# Patient Record
Sex: Male | Born: 1975 | Race: White | Hispanic: No | Marital: Married | Smoking: Current every day smoker
Health system: Southern US, Community
[De-identification: ages and names within clinical notes are randomized; demographics above are authoritative.]

## PROBLEM LIST (undated history)

## (undated) DIAGNOSIS — K449 Diaphragmatic hernia without obstruction or gangrene: Secondary | ICD-10-CM

## (undated) DIAGNOSIS — N200 Calculus of kidney: Secondary | ICD-10-CM

## (undated) DIAGNOSIS — R109 Unspecified abdominal pain: Secondary | ICD-10-CM

## (undated) DIAGNOSIS — M549 Dorsalgia, unspecified: Secondary | ICD-10-CM

## (undated) DIAGNOSIS — R7401 Elevation of levels of liver transaminase levels: Secondary | ICD-10-CM

## (undated) DIAGNOSIS — R7402 Elevation of levels of lactic acid dehydrogenase (LDH): Secondary | ICD-10-CM

## (undated) DIAGNOSIS — K3184 Gastroparesis: Secondary | ICD-10-CM

## (undated) DIAGNOSIS — K760 Fatty (change of) liver, not elsewhere classified: Secondary | ICD-10-CM

## (undated) DIAGNOSIS — R740 Nonspecific elevation of levels of transaminase and lactic acid dehydrogenase [LDH]: Secondary | ICD-10-CM

## (undated) DIAGNOSIS — K219 Gastro-esophageal reflux disease without esophagitis: Secondary | ICD-10-CM

## (undated) HISTORY — DX: Dorsalgia, unspecified: M54.9

## (undated) HISTORY — PX: OTHER SURGICAL HISTORY: SHX169

## (undated) HISTORY — PX: TONSILLECTOMY: SUR1361

## (undated) HISTORY — DX: Unspecified abdominal pain: R10.9

## (undated) HISTORY — DX: Nonspecific elevation of levels of transaminase and lactic acid dehydrogenase (ldh): R74.0

## (undated) HISTORY — DX: Calculus of kidney: N20.0

## (undated) HISTORY — DX: Elevation of levels of lactic acid dehydrogenase (LDH): R74.02

## (undated) HISTORY — DX: Fatty (change of) liver, not elsewhere classified: K76.0

## (undated) HISTORY — DX: Gastro-esophageal reflux disease without esophagitis: K21.9

## (undated) HISTORY — DX: Gastroparesis: K31.84

## (undated) HISTORY — DX: Diaphragmatic hernia without obstruction or gangrene: K44.9

## (undated) HISTORY — DX: Elevation of levels of liver transaminase levels: R74.01

---

## 1998-06-21 ENCOUNTER — Emergency Department (HOSPITAL_COMMUNITY): Admission: EM | Admit: 1998-06-21 | Discharge: 1998-06-21 | Payer: Self-pay | Admitting: Emergency Medicine

## 2004-03-09 ENCOUNTER — Ambulatory Visit (HOSPITAL_COMMUNITY): Admission: RE | Admit: 2004-03-09 | Discharge: 2004-03-09 | Payer: Self-pay | Admitting: Family Medicine

## 2005-07-21 ENCOUNTER — Emergency Department (HOSPITAL_COMMUNITY): Admission: EM | Admit: 2005-07-21 | Discharge: 2005-07-21 | Payer: Self-pay | Admitting: Emergency Medicine

## 2010-03-11 ENCOUNTER — Emergency Department (HOSPITAL_COMMUNITY): Admission: EM | Admit: 2010-03-11 | Discharge: 2010-03-11 | Payer: Self-pay | Admitting: Emergency Medicine

## 2010-07-20 ENCOUNTER — Ambulatory Visit: Payer: Self-pay | Admitting: Internal Medicine

## 2010-07-20 DIAGNOSIS — R51 Headache: Secondary | ICD-10-CM | POA: Insufficient documentation

## 2010-07-20 DIAGNOSIS — F17200 Nicotine dependence, unspecified, uncomplicated: Secondary | ICD-10-CM | POA: Insufficient documentation

## 2010-07-20 DIAGNOSIS — R519 Headache, unspecified: Secondary | ICD-10-CM | POA: Insufficient documentation

## 2010-07-20 DIAGNOSIS — Z720 Tobacco use: Secondary | ICD-10-CM

## 2010-07-20 DIAGNOSIS — R040 Epistaxis: Secondary | ICD-10-CM | POA: Insufficient documentation

## 2010-07-20 HISTORY — DX: Tobacco use: Z72.0

## 2010-07-20 LAB — CONVERTED CEMR LAB
AST: 25 units/L (ref 0–37)
Albumin: 4.5 g/dL (ref 3.5–5.2)
Alkaline Phosphatase: 79 units/L (ref 39–117)
Basophils Relative: 0.8 % (ref 0.0–3.0)
CO2: 31 meq/L (ref 19–32)
Calcium: 9.9 mg/dL (ref 8.4–10.5)
Chloride: 101 meq/L (ref 96–112)
Cholesterol: 227 mg/dL — ABNORMAL HIGH (ref 0–200)
Eosinophils Absolute: 0.2 10*3/uL (ref 0.0–0.7)
Glucose, Bld: 87 mg/dL (ref 70–99)
HCT: 44.4 % (ref 39.0–52.0)
Hemoglobin: 15.4 g/dL (ref 13.0–17.0)
Lymphocytes Relative: 28.2 % (ref 12.0–46.0)
Lymphs Abs: 2.4 10*3/uL (ref 0.7–4.0)
MCHC: 34.8 g/dL (ref 30.0–36.0)
Neutro Abs: 5.3 10*3/uL (ref 1.4–7.7)
Potassium: 4.7 meq/L (ref 3.5–5.1)
RBC: 5.2 M/uL (ref 4.22–5.81)
RDW: 13 % (ref 11.5–14.6)
Sodium: 138 meq/L (ref 135–145)
Total Protein: 7.3 g/dL (ref 6.0–8.3)

## 2010-08-08 ENCOUNTER — Telehealth: Payer: Self-pay | Admitting: Internal Medicine

## 2010-08-13 HISTORY — PX: UPPER GASTROINTESTINAL ENDOSCOPY: SHX188

## 2010-09-12 NOTE — Assessment & Plan Note (Signed)
Summary: to be est/nosebleeds  on and off/njr   Vital Signs:  Patient profile:   35 year old male Weight:      207 pounds O2 Sat:      96 % Temp:     98.7 degrees F Pulse rate:   62 / minute BP sitting:   110 / 64  (left arm)  Vitals Entered By: Pura Spice, RN (July 20, 2010 1:11 PM) CC: nosebleeds off and on  new to establish   CC:  nosebleeds off and on  new to establish.  History of Present Illness: Pt presents to clinic for CPE and evaluation of epistaxis.  States had several childhood episodes of epistaxis however were related to heat while playing football.  Had no further until one month ago and now appears to be associated with nasal congestion. Left more than right nares affected and sometimes occur daily. Resolve with tissue packing and duration has been several minutes to max of 15 mins. Notes mild blood intermiitently mixed with nasal mucous. No gross active bleeding or uncontrollable bleeding noted. Denies h/o coagulopathy, family h/o coagulopathy and had previous surgery without difficulty. Also notes intermittent temporal ha's that may be associated with sinus signs x one month. No obvious h/o neurologic symptoms.   Preventive Screening-Counseling & Management  Alcohol-Tobacco     Smoking Cessation Counseling: yes  Allergies (verified): No Known Drug Allergies  Comments:  Elicia Lui: The patient is currently on medications but does not know the name or dosage at this time. Instructed to contact our office with details. Will update medication list at that time.  Family History: Denies any known family hx. S/p tonsillectomy  Review of Systems      See HPI General:  Denies chills and fever. Eyes:  Denies blurring and double vision. ENT:  Complains of nasal congestion and nosebleeds. CV:  Denies chest pain or discomfort, difficulty breathing at night, difficulty breathing while lying down, and shortness of breath with exertion. Resp:  Denies chest  discomfort, cough, and coughing up blood. GI:  Denies abdominal pain, bloody stools, constipation, nausea, and vomiting. Derm:  Denies rash. Neuro:  Complains of headaches; denies brief paralysis, inability to speak, memory loss, poor balance, visual disturbances, and weakness.  Physical Exam  General:  Well-developed,well-nourished,in no acute distress; alert,appropriate and cooperative throughout examination Head:  Normocephalic and atraumatic without obvious abnormalities. No apparent alopecia or balding. Eyes:  vision grossly intact, pupils equal, pupils round, pupils reactive to light, corneas and lenses clear, and no injection.   Ears:  External ear exam shows no significant lesions or deformities.  Otoscopic examination reveals clear canals, tympanic membranes are intact bilaterally without bulging, retraction, inflammation or discharge. Hearing is grossly normal bilaterally. Nose:  no external deformity, no external erythema, no nasal discharge, no airflow obstruction, no intranasal foreign body, no nasal mucosal lesions, no mucosal friability, no active bleeding or clots, and mucosal edema.   Mouth:  Oral mucosa and oropharynx without lesions or exudates.  Teeth in good repair. Neck:  No deformities, masses, or tenderness noted. Lungs:  Normal respiratory effort, chest expands symmetrically. Lungs are clear to auscultation, no crackles or wheezes. Heart:  Normal rate and regular rhythm. S1 and S2 normal without gallop, murmur, click, rub or other extra sounds. Abdomen:  Bowel sounds positive,abdomen soft and non-tender without masses, organomegaly or hernias noted. Extremities:  No clubbing, cyanosis, edema, or deformity noted with normal full range of motion of all joints.   Neurologic:  alert & oriented X3 and gait normal.   Skin:  turgor normal, no rashes, and no suspicious lesions.   Cervical Nodes:  no anterior cervical adenopathy.     Impression & Recommendations:  Problem #  1:  HEALTH MAINTENANCE EXAM (ICD-V70.0) Discussed preventive care issues. Administered influenza vaccine. Smoking cessation recommended and reviewed >3 mins. Discussed weaning to cessation vs nicotine patches. Understands not to smoke while using patches. Orders: Venipuncture (44010) Specimen Handling (27253) TLB-BMP (Basic Metabolic Panel-BMET) (80048-METABOL) TLB-CBC Platelet - w/Differential (85025-CBCD) TLB-Hepatic/Liver Function Pnl (80076-HEPATIC) TLB-Lipid Panel (80061-LIPID) Specimen Handling (66440) Venipuncture (34742) UA Dipstick w/o Micro (automated)  (81003)  Problem # 2:  EPISTAXIS (ICD-784.7) Suspect mucosal irritation. Recommend otc saline spray regularly during the day until episodes cease. ENT consult if symptoms unresponsive to tx. No hx to suggest coagulopathy.    Problem # 3:  HEADACHE (ICD-784.0) Possible sinus contibution. No associated neurolgic symptoms or alarming features. Attempt empiric flonase once epistaxis resolved. Followup if no improvement or worsening.  Problem # 4:  TOBACCO USER (ICD-305.1) Assessment: Comment Only  Orders: Tobacco use cessation intermediate 3-10 minutes (99406)  Complete Medication List: 1)  Saline Nasal Spray 0.65 % Soln (Saline) .... As directed 2)  Flonase 50 Mcg/act Susp (Fluticasone propionate) .... 2 sprays each nostril qhs  Patient Instructions: 1)  Use nasal salt water spray as we discussed. Followup if no improvement. 2)  Tobacco is very bad for your health and your loved ones! You Should stop smoking!. 3)  Stop Smoking Tips: Choose a Quit date. Cut down before the Quit date. decide what you will do as a substitute when you feel the urge to smoke(gum,toothpick,exercise). Prescriptions: FLONASE 50 MCG/ACT SUSP (FLUTICASONE PROPIONATE) 2 sprays each nostril qhs  #1 x 5   Entered and Authorized by:   Edwyna Perfect MD   Signed by:   Edwyna Perfect MD on 07/20/2010   Method used:   Print then Give to Patient   RxID:    [id number]    Orders Added: 1)  Venipuncture [88416] 2)  Specimen Handling [99000] 3)  TLB-BMP (Basic Metabolic Panel-BMET) [80048-METABOL] 4)  TLB-CBC Platelet - w/Differential [85025-CBCD] 5)  TLB-Hepatic/Liver Function Pnl [80076-HEPATIC] 6)  TLB-Lipid Panel [80061-LIPID] 7)  Specimen Handling [99000] 8)  Venipuncture [36415] 9)  UA Dipstick w/o Micro (automated)  [81003] 10)  Tobacco use cessation intermediate 3-10 minutes [99406] 11)  New Patient 18-39 years [99385]

## 2010-09-14 NOTE — Progress Notes (Signed)
Summary: Call A Nurse   Call-A-Nurse Triage Call Report Triage Record Num: [id number] Operator: April Finney Patient Name: Gregory Fowler Call Date & Time: 08/05/2010 10:21:54AM Patient Phone: [phone] PCP: Patient Gender: Male PCP Fax : Patient DOB: May 29, 1976 Practice Name: [facility] Reason for Call: [facility] calling about thick green eye drainage. Eye lashes matted together. Afebrile. No swelling or redness. No emergent symptoms. Care advice given. Rx for Polytrim eye drops called in per standing order to [facility] [phone]. Protocol(s) Used: Eye: Infection or Irritation Recommended Outcome per Protocol: See [facility] within 24 hours Reason for Outcome: New onset of eye redness, irritation/foreign body sensation or gritty feeling with watery or sticky mucus drainage Care Advice: Call Timea Breed if symptoms get worse, or you develop increasing eye pain, changes in vision, or blisters or sores on eye or insides of eyelids.  ~ Avoid touching or rubbing the eyes; wash hands frequently and do not share towels, washcloths or other personal care items with others.  ~  ~ Apply a warm compress to the eye(s) four times a day for 10 to 15 minutes.  ~ SYMPTOM / CONDITION MANAGEMENT  ~ INFECTION CONTROL  ~ CAUTIONS SEE Roel Douthat WITHIN 4 HOURS if eyelid or area surrounding eye is red, warm, or tender.  ~  ~ Use nonprescription artificial tears as directed on label or by pharmacist to relieve discomfort. 08/05/2010 10:47:37AM Page 1 of 1 [email or web address]

## 2010-10-28 LAB — CBC
HCT: 44.5 % (ref 39.0–52.0)
Hemoglobin: 15.6 g/dL (ref 13.0–17.0)
RBC: 5.16 MIL/uL (ref 4.22–5.81)
WBC: 9.6 10*3/uL (ref 4.0–10.5)

## 2010-10-28 LAB — COMPREHENSIVE METABOLIC PANEL
ALT: 57 U/L — ABNORMAL HIGH (ref 0–53)
Alkaline Phosphatase: 74 U/L (ref 39–117)
CO2: 23 mEq/L (ref 19–32)
Chloride: 104 mEq/L (ref 96–112)
GFR calc non Af Amer: >60 mL/min (ref >60)
Glucose, Bld: 81 mg/dL (ref 70–99)
Potassium: 3.7 mEq/L (ref 3.5–5.1)
Sodium: 135 mEq/L (ref 135–145)
Total Bilirubin: 0.4 mg/dL (ref 0.3–1.2)
Total Protein: 7 g/dL (ref 6.0–8.3)

## 2010-10-28 LAB — URINALYSIS, ROUTINE W REFLEX MICROSCOPIC
Glucose, UA: NEGATIVE mg/dL
Hgb urine dipstick: NEGATIVE
pH: 6.5 (ref 5.0–8.0)

## 2010-10-28 LAB — DIFFERENTIAL
Basophils Relative: 1 % (ref 0–1)
Eosinophils Absolute: 0.3 10*3/uL (ref 0.0–0.7)
Monocytes Relative: 7 % (ref 3–12)
Neutrophils Relative %: 56 % (ref 43–77)

## 2010-12-07 ENCOUNTER — Encounter: Payer: Self-pay | Admitting: Internal Medicine

## 2010-12-08 ENCOUNTER — Ambulatory Visit: Payer: Self-pay | Admitting: Internal Medicine

## 2010-12-11 ENCOUNTER — Ambulatory Visit: Payer: Self-pay | Admitting: Internal Medicine

## 2011-02-16 ENCOUNTER — Encounter: Payer: Self-pay | Admitting: Internal Medicine

## 2011-02-16 ENCOUNTER — Ambulatory Visit (INDEPENDENT_AMBULATORY_CARE_PROVIDER_SITE_OTHER): Payer: Self-pay | Admitting: Internal Medicine

## 2011-02-16 DIAGNOSIS — R109 Unspecified abdominal pain: Secondary | ICD-10-CM

## 2011-02-16 DIAGNOSIS — R197 Diarrhea, unspecified: Secondary | ICD-10-CM

## 2011-02-16 LAB — HEPATIC FUNCTION PANEL
ALT: 66 U/L — ABNORMAL HIGH (ref 0–53)
AST: 34 U/L (ref 0–37)
Bilirubin, Direct: 0 mg/dL (ref 0.0–0.3)
Total Bilirubin: 0.5 mg/dL (ref 0.3–1.2)

## 2011-02-16 LAB — CBC WITH DIFFERENTIAL/PLATELET
Basophils Absolute: 0.1 10*3/uL (ref 0.0–0.1)
Basophils Relative: 0.8 % (ref 0.0–3.0)
Eosinophils Absolute: 0.2 10*3/uL (ref 0.0–0.7)
Lymphocytes Relative: 32.5 % (ref 12.0–46.0)
MCHC: 35.3 g/dL (ref 30.0–36.0)
Neutrophils Relative %: 55.5 % (ref 43.0–77.0)
RBC: 5.07 Mil/uL (ref 4.22–5.81)

## 2011-02-16 LAB — BASIC METABOLIC PANEL
Chloride: 105 mEq/L (ref 96–112)
Potassium: 4 mEq/L (ref 3.5–5.1)

## 2011-02-16 MED ORDER — METRONIDAZOLE 500 MG PO TABS: 500.0000 mg | ORAL_TABLET | Freq: Three times a day (TID) | ORAL | Status: AC

## 2011-02-16 NOTE — Assessment & Plan Note (Signed)
Chronic with associated mild abdominal pain. Obtain cbc, chem7, lft , amylase, lipase, celiac ab and stool studies. Begin empiric course of flagyl x 7days (instructed to avoid etoh). Followup in 2wks of sooner if necessary.

## 2011-02-16 NOTE — Progress Notes (Signed)
  Subjective:    Patient ID: Gregory Fowler, male    DOB: 01/11/1976, 35 y.o.   MRN: [id number]  HPI Pt presents to clinic for evaluation of diarrhea and abdominal discomfort. Notes 2 month h/o increased gas production, mild mid upper and bilateral lower abdominal discomfort with nausea and diarrhea. Describes loose watery stools ~2-3/day without mucous or blood. Denies emesis, hematemesis. No obvoious triggers, exacerbating or alleviating factors. No international travel. Attempted otc antacids and gas-ex without improvement. No other complaints.  Reviewed pmh, medications and allergies    Review of Systems  Constitutional: Negative for fever and chills.  Gastrointestinal: Positive for nausea, abdominal pain and diarrhea. Negative for vomiting, constipation, blood in stool, abdominal distention, anal bleeding and rectal pain.  Skin: Negative for rash.       Objective:   Physical Exam  Nursing note and vitals reviewed. Constitutional: He appears well-developed and well-nourished.  HENT:  Head: Normocephalic and atraumatic.  Eyes: Conjunctivae are normal. No scleral icterus.  Abdominal: Soft. Normal appearance. He exhibits no distension, no ascites and no mass. Bowel sounds are increased. There is no hepatosplenomegaly. There is tenderness in the right lower quadrant, epigastric area and left lower quadrant. There is no rigidity, no rebound and no guarding.       Minimal discomfort to palpation epigastric and bilateral LQ's  Neurological: He is alert.  Skin: Skin is warm and dry.  Psychiatric: He has a normal mood and affect.          Assessment & Plan:

## 2011-02-17 LAB — FECAL LACTOFERRIN, QUANT: Lactoferrin: NEGATIVE

## 2011-02-19 LAB — OVA AND PARASITE SCREEN: OP: NONE SEEN

## 2011-02-20 LAB — STOOL CULTURE

## 2011-02-21 ENCOUNTER — Telehealth: Payer: Self-pay

## 2011-02-21 NOTE — Telephone Encounter (Signed)
Message copied by Beverely Low on Wed Feb 21, 2011  2:07 PM ------      Message from: Staci Righter      Created: Wed Feb 21, 2011 10:29 AM       Stool tests neg. Single mild elevation of lft. Can be rechecked next visit. Other labs nl

## 2011-02-21 NOTE — Telephone Encounter (Signed)
Pt.notified

## 2011-02-21 NOTE — Telephone Encounter (Signed)
Message copied by Beverely Low on Wed Feb 21, 2011  2:10 PM ------      Message from: Staci Righter      Created: Wed Feb 21, 2011 10:29 AM       Stool tests neg. Single mild elevation of lft. Can be rechecked next visit. Other labs nl

## 2011-02-23 ENCOUNTER — Ambulatory Visit: Payer: Self-pay | Admitting: Internal Medicine

## 2011-03-02 ENCOUNTER — Ambulatory Visit (INDEPENDENT_AMBULATORY_CARE_PROVIDER_SITE_OTHER): Payer: BC Managed Care – PPO | Admitting: Internal Medicine

## 2011-03-02 ENCOUNTER — Encounter: Payer: Self-pay | Admitting: Internal Medicine

## 2011-03-02 VITALS — BP 110/78 | Temp 98.5°F | Wt 216.0 lb

## 2011-03-02 DIAGNOSIS — R109 Unspecified abdominal pain: Secondary | ICD-10-CM

## 2011-03-02 DIAGNOSIS — R7989 Other specified abnormal findings of blood chemistry: Secondary | ICD-10-CM

## 2011-03-02 DIAGNOSIS — R945 Abnormal results of liver function studies: Secondary | ICD-10-CM

## 2011-03-02 LAB — HEPATIC FUNCTION PANEL
ALT: 81 U/L — ABNORMAL HIGH (ref 0–53)
Total Protein: 7.3 g/dL (ref 6.0–8.3)

## 2011-03-02 MED ORDER — OMEPRAZOLE 40 MG PO CPDR: 40.0000 mg | DELAYED_RELEASE_CAPSULE | Freq: Every day | ORAL | Status: DC

## 2011-03-03 DIAGNOSIS — R945 Abnormal results of liver function studies: Secondary | ICD-10-CM | POA: Insufficient documentation

## 2011-03-03 DIAGNOSIS — R7989 Other specified abnormal findings of blood chemistry: Secondary | ICD-10-CM

## 2011-03-03 DIAGNOSIS — R109 Unspecified abdominal pain: Secondary | ICD-10-CM | POA: Insufficient documentation

## 2011-03-03 HISTORY — DX: Other specified abnormal findings of blood chemistry: R79.89

## 2011-03-03 NOTE — Progress Notes (Signed)
  Subjective:    Patient ID: Gregory Fowler, male    DOB: [birth year], 35 y.o.   MRN: [id number]  HPI Patient presents to clinic for evaluation of elevated liver tests and abdominal pain. Last seen with nausea diarrhea as well as upper and lower abdominal pain. Stool studies unremarkable and reviewed today. Completed 7 day course of Flagyl. Denies lower abdominal pain nausea or diarrhea. Does note upper epigastric pain and tenderness associated with increased gas production. No emesis or blood in stool. Reviewed lab results including CBC, Chem-7 amylase normal. Liver function tests significant for minimally elevated ALT of 66. March 2012 demonstrated normal liver function tests. Emergency department labs December 2011 demonstrate ALT of 57. Denies jaundice, or excessive Tylenol use. States prior to his last labs did drink alcohol. No other complaint  Reviewed past medical history, medications and allergies  Review of Systems see history of present illness     Objective:   Physical Exam  Nursing note and vitals reviewed. Constitutional: He appears well-developed and well-nourished. No distress.  HENT:  Head: Normocephalic and atraumatic.  Right Ear: External ear normal.  Left Ear: External ear normal.  Eyes: Conjunctivae are normal. No scleral icterus.  Abdominal: Soft. Bowel sounds are normal. He exhibits no distension, no ascites and no mass. There is no hepatosplenomegaly. There is tenderness in the epigastric area. There is no rigidity, no rebound, no guarding and negative Murphy's sign.  Skin: Skin is warm and dry. No rash noted. He is not diaphoretic. No erythema.  Psychiatric: He has a normal mood and affect.          Assessment & Plan:

## 2011-03-03 NOTE — Assessment & Plan Note (Signed)
Begin omeprazole 40 mg a day. Followup if no improvement or worsening within approximately 3 weeks.

## 2011-03-03 NOTE — Assessment & Plan Note (Signed)
Minimal elevation. Imminent mild elevations noted. Patient wonders if this elevation attributed to alcohol ingestion. Repeat liver function test today. If remains elevated pursued further evaluation

## 2011-03-05 ENCOUNTER — Other Ambulatory Visit: Payer: Self-pay | Admitting: Internal Medicine

## 2011-03-05 DIAGNOSIS — R945 Abnormal results of liver function studies: Secondary | ICD-10-CM

## 2011-03-05 DIAGNOSIS — R7989 Other specified abnormal findings of blood chemistry: Secondary | ICD-10-CM

## 2011-04-05 ENCOUNTER — Encounter: Payer: Self-pay | Admitting: Internal Medicine

## 2011-04-05 ENCOUNTER — Ambulatory Visit: Payer: BC Managed Care – PPO | Admitting: Internal Medicine

## 2011-04-05 ENCOUNTER — Ambulatory Visit (INDEPENDENT_AMBULATORY_CARE_PROVIDER_SITE_OTHER): Payer: BC Managed Care – PPO | Admitting: Internal Medicine

## 2011-04-05 DIAGNOSIS — R109 Unspecified abdominal pain: Secondary | ICD-10-CM

## 2011-04-05 DIAGNOSIS — R7401 Elevation of levels of liver transaminase levels: Secondary | ICD-10-CM

## 2011-04-05 DIAGNOSIS — M549 Dorsalgia, unspecified: Secondary | ICD-10-CM

## 2011-04-05 DIAGNOSIS — R7989 Other specified abnormal findings of blood chemistry: Secondary | ICD-10-CM

## 2011-04-05 DIAGNOSIS — R748 Abnormal levels of other serum enzymes: Secondary | ICD-10-CM

## 2011-04-05 DIAGNOSIS — R945 Abnormal results of liver function studies: Secondary | ICD-10-CM

## 2011-04-05 LAB — FERRITIN: Ferritin: 186 ng/mL (ref 22–322)

## 2011-04-05 MED ORDER — CYCLOBENZAPRINE HCL 10 MG PO TABS: ORAL_TABLET | ORAL | Status: DC

## 2011-04-06 LAB — H. PYLORI ANTIBODY, IGG: H Pylori IgG: 0.5 {ISR}

## 2011-04-06 LAB — HEPATITIS PANEL, ACUTE: Hepatitis B Surface Ag: NEGATIVE

## 2011-04-08 DIAGNOSIS — M549 Dorsalgia, unspecified: Secondary | ICD-10-CM | POA: Insufficient documentation

## 2011-04-08 NOTE — Assessment & Plan Note (Signed)
Obtain ferritin and hepatitis panel. Schedule abd [place].

## 2011-04-08 NOTE — Progress Notes (Signed)
  Subjective:    Patient ID: Gregory Fowler, male    DOB: [birth year], 35 y.o.   MRN: [id number]  HPI Pt presents to clinic for followup of multiple medical problems. Notes continued abd pain and belching depiste daily ppi. Has mildly abn lft. C/o intermittent upper back pain without injury/truama or radiation. No exacerbating or alleviating factors. Taking no medications for the problem. No other complaints.  No past medical history on file. Past Surgical History  Procedure Date  . Tonsillectomy     reports that he has been smoking Cigarettes.  He has been smoking about 1 pack per day. He does not have any smokeless tobacco history on file. His alcohol and drug histories not on file. family history is not on file. No Known Allergies   Review of Systems see hpi     Objective:   Physical Exam  Physical Exam  Nursing note and vitals reviewed. Constitutional: Appears well-developed and well-nourished. No distress.  HENT:  Head: Normocephalic and atraumatic.  Right Ear: External ear normal.  Left Ear: External ear normal.  Eyes: Conjunctivae are normal. No scleral icterus.  Neck: Neck supple.   Cardiovascular: Normal rate, regular rhythm and normal heart sounds.  Exam reveals no gallop and no friction rub.   No murmur heard. Pulmonary/Chest: Effort normal and breath sounds normal. No respiratory distress. He has no wheezes. no rales.  Abd: soft,nd,sl tenderness epigastric area without rebound, guarding or rigidity. Lymphadenopathy:    He has no cervical adenopathy.  Neurological:Alert.  Skin: Skin is warm and dry. Not diaphoretic.  Psychiatric: Has a normal mood and affect.        Assessment & Plan:

## 2011-04-08 NOTE — Assessment & Plan Note (Signed)
Attempt flexeril. Avoid nsaids with abn pain. Followup if no improvement or worsening.

## 2011-04-08 NOTE — Assessment & Plan Note (Signed)
Obtain h pylori ab. Continue ppi. Consider gi consult if sx's persist

## 2011-04-09 ENCOUNTER — Telehealth: Payer: Self-pay | Admitting: *Deleted

## 2011-04-09 ENCOUNTER — Other Ambulatory Visit: Payer: Self-pay | Admitting: Internal Medicine

## 2011-04-09 DIAGNOSIS — R109 Unspecified abdominal pain: Secondary | ICD-10-CM

## 2011-04-09 NOTE — Telephone Encounter (Signed)
Call was placed to patient at [phone], regarding las results. He was informed Dr Rodena Medin would like to refer him for GI evaluation. Patient states that he will be coming in for Ultrasound at 9:30 am and would like to know if he needs both the [place] and the GI referral.

## 2011-04-09 NOTE — Telephone Encounter (Signed)
[place] is for liver. Gi referral order placed

## 2011-04-10 ENCOUNTER — Ambulatory Visit (HOSPITAL_BASED_OUTPATIENT_CLINIC_OR_DEPARTMENT_OTHER)
Admission: RE | Admit: 2011-04-10 | Discharge: 2011-04-10 | Disposition: A | Discharge status: Home / Self Care | Payer: BC Managed Care – PPO | Source: Ambulatory Visit | Attending: Internal Medicine | Admitting: Internal Medicine

## 2011-04-10 DIAGNOSIS — R109 Unspecified abdominal pain: Secondary | ICD-10-CM | POA: Insufficient documentation

## 2011-04-10 DIAGNOSIS — R748 Abnormal levels of other serum enzymes: Secondary | ICD-10-CM

## 2011-04-10 DIAGNOSIS — R945 Abnormal results of liver function studies: Secondary | ICD-10-CM | POA: Insufficient documentation

## 2011-04-10 DIAGNOSIS — R143 Flatulence: Secondary | ICD-10-CM | POA: Insufficient documentation

## 2011-04-10 DIAGNOSIS — R142 Eructation: Secondary | ICD-10-CM | POA: Insufficient documentation

## 2011-04-10 DIAGNOSIS — R141 Gas pain: Secondary | ICD-10-CM | POA: Insufficient documentation

## 2011-04-10 NOTE — Telephone Encounter (Signed)
Call placed to patient at [phone], no answer.  A detailed voice message was left  Informing patient Gregory Fowler is for evaluation of his liver, and the GI referral is for other abdominal symptoms that he has been experiencing. Message was left advising patient to call back if any concerns.

## 2011-04-12 ENCOUNTER — Telehealth: Payer: Self-pay | Admitting: Internal Medicine

## 2011-04-12 NOTE — Telephone Encounter (Signed)
Shouldn't be causing any sx's. Usually a benign problem but still needs to be addressed. Likely causing mild elevation of his lft's. Low fat diet, exercise and wt loss is usual treatment. Keep gi appointment as it was more for his abdominal sx's.

## 2011-04-12 NOTE — Telephone Encounter (Signed)
Patient wants to know is the fatty liver making him burp and have gas.  What should he eat for this condition and where does he find out about it?  Does he need to keep his appt with GI ?  Message told him to control his cholestoral  How does he do that?

## 2011-04-12 NOTE — Telephone Encounter (Signed)
Call placed to patient at [phone], no answer. A voice message was left for patient to return call regarding his questions for the provider.

## 2011-04-13 ENCOUNTER — Ambulatory Visit: Payer: BC Managed Care – PPO | Admitting: Gastroenterology

## 2011-04-13 NOTE — Telephone Encounter (Signed)
Call placed to patient at [phone], he was informed per Dr Rodena Medin instructions and has verbalized understanding.

## 2011-04-30 ENCOUNTER — Encounter: Payer: Self-pay | Admitting: *Deleted

## 2011-05-01 ENCOUNTER — Encounter: Payer: Self-pay | Admitting: Gastroenterology

## 2011-05-01 ENCOUNTER — Ambulatory Visit (INDEPENDENT_AMBULATORY_CARE_PROVIDER_SITE_OTHER): Payer: BC Managed Care – PPO | Admitting: Gastroenterology

## 2011-05-01 DIAGNOSIS — K7689 Other specified diseases of liver: Secondary | ICD-10-CM

## 2011-05-01 DIAGNOSIS — R109 Unspecified abdominal pain: Secondary | ICD-10-CM

## 2011-05-01 DIAGNOSIS — K219 Gastro-esophageal reflux disease without esophagitis: Secondary | ICD-10-CM

## 2011-05-01 DIAGNOSIS — R142 Eructation: Secondary | ICD-10-CM | POA: Insufficient documentation

## 2011-05-01 DIAGNOSIS — K760 Fatty (change of) liver, not elsewhere classified: Secondary | ICD-10-CM

## 2011-05-01 DIAGNOSIS — R141 Gas pain: Secondary | ICD-10-CM

## 2011-05-01 HISTORY — DX: Fatty (change of) liver, not elsewhere classified: K76.0

## 2011-05-01 MED ORDER — METOCLOPRAMIDE HCL 10 MG PO TABS: ORAL_TABLET | ORAL | Status: DC

## 2011-05-01 NOTE — Patient Instructions (Signed)
Your procedure has been scheduled for 05/11/2011, please follow the seperate instructions.

## 2011-05-01 NOTE — Progress Notes (Signed)
History of Present Illness:  This is a 35 year old Caucasian male with intractable belching and burping for the last 3 months despite daily PPI therapy. He has been thoroughly evaluated by Dr. Rodena Medin for moderate elevation of liver function test felt secondary to fatty infiltration of his liver as confirmed by ultrasound. Patient denies a history of alcohol abuse, but does smoke one pack of cigarettes per day. Patient also had a brief period of diarrhea which responded to metronidazole therapy. Currently complains of a sore throat related to a URI that he apparently contracted through his preschool child. The patient has had some regurgitation, but mostly belching and burping, but no dysphagia. He drinks a large amount of carbonated beverages during the day, smokes, does a lot of bending and lifting. Daily PPI therapy for several months has not helped his complaints. He has not had previous endoscopy or barium studies. He currently denies lower gastrointestinal or hepatobiliary complaints. Review of multiple labs by Dr. Rodena Medin are otherwise unremarkable.  I have reviewed this patient's present history, medical and surgical past history, allergies and medications.     ROS: The remainder of the 10 point ROS is negative.. no symptoms of Raynaud's phenomena or collagen vascular disease. He does have a chronic nonproductive cough, recurrent headaches, recurrent nosebleeds, a history of shortness of breath with exertion, and currently a sore throat. He has had previous tonsillectomy. He denies any specific food intolerances.     Physical Exam: General well developed well nourished patient in no acute distress, appearing his stated age Eyes PERRLA, no icterus, fundoscopic exam per opthamologist Skin no lesions noted Neck supple, no adenopathy, no thyroid enlargement, no tenderness.. redness noted in the oropharyngeal area but no ulcerations or other lesions. Chest clear to percussion and auscultation Heart  no significant murmurs, gallops or rubs noted Abdomen no hepatosplenomegaly masses or tenderness, BS normal.  Extremities no acute joint lesions, edema, phlebitis or evidence of cellulitis. Neurologic patient oriented x 3, cranial nerves intact, no focal neurologic deficits noted. Psychological mental status normal and normal affect. He seems somewhat irritated about his whole medical situation.  Assessment and plan: Probable hiatal hernia with associated acid reflux. He also may have an element of delayed gastric emptying. I have tried to schedule endoscopy around his softball scheduling which seems to be his primary concern. We have reviewed standard antireflux maneuvers, discontinuation of carbonated beverages, and a trial of Reglan 10 mg at bedtime while continuing PPI therapy. Depending on his endoscopy results he may need gastric emptying scan, esophageal manometry, 24-hour pH probe testing. He also will need an intensive smoking cessation program. I am concerned about his elevated liver function test certainly suggesting NASH syndrome at an early age. He will need close followup of his liver enzymes, and I would strongly consider liver biopsy these remain elevated over 6 months time.  Encounter Diagnosis  Name Primary?  . Abdominal pain Yes

## 2011-05-11 ENCOUNTER — Other Ambulatory Visit: Payer: Self-pay | Admitting: Gastroenterology

## 2011-05-11 ENCOUNTER — Encounter: Payer: Self-pay | Admitting: Gastroenterology

## 2011-05-11 ENCOUNTER — Ambulatory Visit (AMBULATORY_SURGERY_CENTER): Payer: BC Managed Care – PPO | Admitting: Gastroenterology

## 2011-05-11 VITALS — BP 132/80 | HR 64 | Temp 97.4°F | Resp 16 | Ht 74.0 in | Wt 220.0 lb

## 2011-05-11 DIAGNOSIS — R109 Unspecified abdominal pain: Secondary | ICD-10-CM

## 2011-05-11 DIAGNOSIS — K219 Gastro-esophageal reflux disease without esophagitis: Secondary | ICD-10-CM

## 2011-05-11 MED ORDER — SODIUM CHLORIDE 0.9 % IV SOLN: 500.0000 mL | INTRAVENOUS | Status: DC

## 2011-05-11 MED ORDER — AMBULATORY NON FORMULARY MEDICATION: Status: DC

## 2011-05-11 NOTE — Patient Instructions (Signed)
Discharge instructions given with verbal understanding. Handouts on gastritis,esophagitis, and a soft diet  Given. Office visit in one month. Office will call. Medication sent to pharmacy via third floor.

## 2011-05-11 NOTE — Progress Notes (Signed)
Discussed with wife that the patient stated he was going to play ball tonight. Wife stating he will not be playing ball tonight. Encouraged wife to follow discharge instructions.

## 2011-05-11 NOTE — Progress Notes (Signed)
Pt states that since he started reglan, more than half of the belching has stopped. Much better since stopped the prilosec. EWM  Pt states that he has a soft ball game tonight and was asking if it was ok to play. Instructed several times that he should NOT play ball tonight because of his sedation and the side effects of the sedation. Pt stated unless we told him it was going to kill him, he was going to play  no matter what.  Instructed several times this was not wise by several nurses and he is very clear that he is going to play and that his wife would let him. Encouraged pt to wait on this activity, instructed risks with playing sedated.  EWM

## 2011-05-14 ENCOUNTER — Telehealth: Payer: Self-pay | Admitting: Gastroenterology

## 2011-05-14 ENCOUNTER — Telehealth: Payer: Self-pay | Admitting: *Deleted

## 2011-05-14 ENCOUNTER — Encounter: Payer: Self-pay | Admitting: Gastroenterology

## 2011-05-14 ENCOUNTER — Telehealth: Payer: Self-pay

## 2011-05-14 DIAGNOSIS — K2920 Alcoholic gastritis without bleeding: Secondary | ICD-10-CM

## 2011-05-14 LAB — HELICOBACTER PYLORI SCREEN-BIOPSY: UREASE: NEGATIVE

## 2011-05-14 NOTE — Telephone Encounter (Signed)
2:00 pm I called the pt back.  He had called twice today.  I answered his questions. No further questions at this time.

## 2011-05-14 NOTE — Telephone Encounter (Signed)
Pt asked why he was to take prescription for DOMPERIDOME.  Per Dr. Jarold Motto this medication helps speed up empting of the stomach.  Pt asked if he is to take [facility] even though it did not help previously?  Per Dr. Jarold Motto, no, you do not have to take the prilosec.  Pt asked how to contact the pharmacy in [place], [place]?  Telephone number to the pharmacy is #[phone].  The pt said the pharmacy called him Friday but he did not know anything about Rx going to [place].  I explained all these answers to his questions.  No further questions at this time.

## 2011-05-14 NOTE — Telephone Encounter (Signed)

## 2011-05-14 NOTE — Telephone Encounter (Signed)
Scheduled pt for 1 month f/u- 06/19/11 at 11:30am. Per EGD recommendations, pt was to continue PPI and increase Reglan to BID until he starts Domperidone. He reports he's still burping and food comes up occasionally.   Pt wants to know: 1) Does he stop the Reglan when he starts Domperidone?                               2) He stopped the Prilosec because it wasn't helping; do you want him to try another PPI and is it just until he starts the                                                Domperidone  ?

## 2011-05-15 NOTE — Telephone Encounter (Signed)
I noted that Threasa Beards and I went over this in detail yesterday and that she called this patient and his wife. He was very hostile to her. He is not to take a PPI but have advised twice a day Reglan until domperidone prescription available. He has a further questions he needs to come in for office visit.

## 2011-05-15 NOTE — Telephone Encounter (Signed)
lmom which pt informed I could do this yesterday, to inform him only to take the Reglan Bid until he begins the Domperidone. He may call back for any questions.

## 2011-06-04 ENCOUNTER — Ambulatory Visit: Payer: BC Managed Care – PPO | Admitting: Internal Medicine

## 2011-06-11 ENCOUNTER — Ambulatory Visit: Payer: BC Managed Care – PPO | Admitting: Internal Medicine

## 2011-06-18 ENCOUNTER — Encounter: Payer: Self-pay | Admitting: Gastroenterology

## 2011-06-19 ENCOUNTER — Ambulatory Visit: Payer: BC Managed Care – PPO | Admitting: Gastroenterology

## 2011-09-03 ENCOUNTER — Ambulatory Visit: Payer: BC Managed Care – PPO | Admitting: Internal Medicine

## 2011-10-08 ENCOUNTER — Ambulatory Visit: Payer: BC Managed Care – PPO | Admitting: Internal Medicine

## 2011-11-05 ENCOUNTER — Encounter: Payer: Self-pay | Admitting: Internal Medicine

## 2011-11-05 ENCOUNTER — Ambulatory Visit (INDEPENDENT_AMBULATORY_CARE_PROVIDER_SITE_OTHER): Payer: BC Managed Care – PPO | Admitting: Internal Medicine

## 2011-11-05 VITALS — BP 118/60 | HR 94 | Temp 97.6°F | Resp 18 | Ht 74.0 in | Wt 228.0 lb

## 2011-11-05 DIAGNOSIS — R748 Abnormal levels of other serum enzymes: Secondary | ICD-10-CM

## 2011-11-05 DIAGNOSIS — R143 Flatulence: Secondary | ICD-10-CM

## 2011-11-05 DIAGNOSIS — K760 Fatty (change of) liver, not elsewhere classified: Secondary | ICD-10-CM

## 2011-11-05 DIAGNOSIS — R142 Eructation: Secondary | ICD-10-CM

## 2011-11-05 DIAGNOSIS — R141 Gas pain: Secondary | ICD-10-CM

## 2011-11-05 DIAGNOSIS — K7689 Other specified diseases of liver: Secondary | ICD-10-CM

## 2011-11-05 DIAGNOSIS — R7401 Elevation of levels of liver transaminase levels: Secondary | ICD-10-CM

## 2011-11-05 LAB — HEPATIC FUNCTION PANEL
Albumin: 4.6 g/dL (ref 3.5–5.2)
Alkaline Phosphatase: 80 U/L (ref 39–117)
Total Protein: 6.9 g/dL (ref 6.0–8.3)

## 2011-11-05 MED ORDER — METOCLOPRAMIDE HCL 10 MG PO TABS: 10.0000 mg | ORAL_TABLET | Freq: Two times a day (BID) | ORAL | Status: DC

## 2011-11-05 MED ORDER — OMEPRAZOLE 40 MG PO CPDR: 40.0000 mg | DELAYED_RELEASE_CAPSULE | Freq: Every day | ORAL | Status: DC

## 2011-11-05 NOTE — Patient Instructions (Signed)
Please schedule fasting labs prior to next visit Lipid-chol screening and lft-abn lft

## 2011-11-07 NOTE — Progress Notes (Signed)
  Subjective:    Patient ID: Gregory Fowler, male    DOB: Mar 15, 1976, 36 y.o.   MRN: [id number]  HPI Pt presents to clinic for followup of multiple medical problems. Undergoing GI evaluation- still notes belching. reglan improved sx's but not currently taking. Off PPI. Reviewed EGD finding of gastritis and esophagitis. H/o abn lft c/w fatty liver by [place]. Wt increased by 11 lbs since last visit.   Past Medical History  Diagnosis Date  . Nonspecific elevation of levels of transaminase or lactic acid dehydrogenase (LDH)   . Abdominal  pain, other specified site   . Backache, unspecified   . Fatty liver    Past Surgical History  Procedure Date  . Tonsillectomy     reports that he has been smoking Cigarettes.  He has been smoking about 1 pack per day. He has never used smokeless tobacco. He reports that he drinks alcohol. He reports that he does not use illicit drugs. family history includes Heart disease in his paternal aunt and paternal grandmother and Liver disease in his mother. No Known Allergies    Review of Systems see hpi     Objective:   Physical Exam  Constitutional: He appears well-developed and well-nourished. No distress.  HENT:  Head: Normocephalic and atraumatic.  Abdominal: Soft. Normal appearance and bowel sounds are normal. He exhibits no distension and no mass. There is no hepatosplenomegaly. There is no tenderness. There is no rebound and no guarding.  Neurological: He is alert.  Skin: He is not diaphoretic.  Psychiatric: He has a normal mood and affect.          Assessment & Plan:

## 2011-11-07 NOTE — Assessment & Plan Note (Signed)
Obtain lft. Recommend obtain lipid profile, perform regular exercise and lose wt. Understands can be treated conservatively but can lead to liver damage long term.

## 2011-11-07 NOTE — Assessment & Plan Note (Signed)
Resume reglan at bid dosing. Understands potential for involuntary movements with long term use. Resume ppi due to egd finding.

## 2011-12-15 ENCOUNTER — Encounter (HOSPITAL_BASED_OUTPATIENT_CLINIC_OR_DEPARTMENT_OTHER): Payer: Self-pay | Admitting: *Deleted

## 2011-12-15 ENCOUNTER — Emergency Department (INDEPENDENT_AMBULATORY_CARE_PROVIDER_SITE_OTHER): Payer: BC Managed Care – PPO

## 2011-12-15 ENCOUNTER — Emergency Department (HOSPITAL_BASED_OUTPATIENT_CLINIC_OR_DEPARTMENT_OTHER)
Admission: EM | Admit: 2011-12-15 | Discharge: 2011-12-15 | Disposition: A | Discharge status: Home / Self Care | Payer: BC Managed Care – PPO | Attending: Emergency Medicine | Admitting: Emergency Medicine

## 2011-12-15 DIAGNOSIS — X500XXA Overexertion from strenuous movement or load, initial encounter: Secondary | ICD-10-CM

## 2011-12-15 DIAGNOSIS — Y9364 Activity, baseball: Secondary | ICD-10-CM | POA: Insufficient documentation

## 2011-12-15 DIAGNOSIS — F17200 Nicotine dependence, unspecified, uncomplicated: Secondary | ICD-10-CM | POA: Insufficient documentation

## 2011-12-15 DIAGNOSIS — S93402A Sprain of unspecified ligament of left ankle, initial encounter: Secondary | ICD-10-CM

## 2011-12-15 DIAGNOSIS — M25579 Pain in unspecified ankle and joints of unspecified foot: Secondary | ICD-10-CM | POA: Insufficient documentation

## 2011-12-15 DIAGNOSIS — M7989 Other specified soft tissue disorders: Secondary | ICD-10-CM

## 2011-12-15 DIAGNOSIS — S93409A Sprain of unspecified ligament of unspecified ankle, initial encounter: Secondary | ICD-10-CM | POA: Insufficient documentation

## 2011-12-15 NOTE — ED Provider Notes (Signed)
History     CSN: [id number]  Arrival date & time 12/15/11  2126   First MD Initiated Contact with Patient 12/15/11 2204      Chief Complaint  Patient presents with  . Ankle Pain    (Consider location/radiation/quality/duration/timing/severity/associated sxs/prior treatment) HPI History provided by pt.   Pt presents w/ inversion injury left ankle that occurred when he landed from a jump during a softball game just pta.  C/o mild pain lateral ankle w/ associated edema/ecchymosis.  Denies paresthesias.  Was bearing weight initially but his wife reports that hes limping now.  Has not taken anything for pain.   Past Medical History  Diagnosis Date  . Nonspecific elevation of levels of transaminase or lactic acid dehydrogenase (LDH)   . Abdominal  pain, other specified site   . Backache, unspecified   . Fatty liver     Past Surgical History  Procedure Date  . Tonsillectomy     Family History  Problem Relation Age of Onset  . Heart disease Paternal Aunt   . Heart disease Paternal Grandmother   . Liver disease Mother     History  Substance Use Topics  . Smoking status: Current Everyday Smoker -- 1.0 packs/day    Types: Cigarettes  . Smokeless tobacco: Never Used  . Alcohol Use: Yes     occasional      Review of Systems  All other systems reviewed and are negative.    Allergies  Review of patient's allergies indicates no known allergies.  Home Medications   Current Outpatient Rx  Name Route Sig Dispense Refill  . AMBULATORY NON FORMULARY MEDICATION  Medication Name: Domperidone 10mg  take one tablet before meals and at bedtime 240 tablet 3  . OMEGA-3 FATTY ACIDS 1000 MG PO CAPS Oral Take 1 g by mouth daily.      Marland Kitchen METOCLOPRAMIDE HCL 10 MG PO TABS Oral Take 10 mg by mouth 2 (two) times daily.    . ADULT MULTIVITAMIN W/MINERALS CH Oral Take 1 tablet by mouth daily.    Marland Kitchen OMEPRAZOLE 40 MG PO CPDR Oral Take 1 capsule (40 mg total) by mouth daily. 30 capsule 3    BP  131/78  Pulse 94  Temp(Src) 98.4 F (36.9 C) (Oral)  Resp 16  SpO2 97%  Physical Exam  Nursing note and vitals reviewed. Constitutional: He is oriented to person, place, and time. He appears well-developed and well-nourished. No distress.  HENT:  Head: Normocephalic and atraumatic.  Eyes:       Normal appearance  Neck: Normal range of motion.  Pulmonary/Chest: Effort normal.  Musculoskeletal: Normal range of motion.       Significant edema and ecchymosis over lateral malleolus.  Tenderness at and inferior to lateral malleolus.  Pain w/ foot inversion/eversion only.  2+ DP pulse and distal sensation intact.    Neurological: He is alert and oriented to person, place, and time.  Psychiatric: He has a normal mood and affect. His behavior is normal.    ED Course  Procedures (including critical care time)  Labs Reviewed - No data to display Dg Ankle Complete Left  12/15/2011  *RADIOLOGY REPORT*  Clinical Data: 36 year old male status post twisting injury with pain and swelling.  LEFT ANKLE COMPLETE - 3+ VIEW  Comparison: None.  Findings: Anterior and lateral soft tissue swelling at the ankle. Mortise joint alignment preserved.  No definite joint effusion. Talar dome intact.  No acute fracture or dislocation identified. Bone mineralization is within normal limits.  IMPRESSION: Soft tissue injury with no acute fracture or dislocation identified about the left ankle.  Original Report Authenticated By: Ulla Potash III, M.D.     1. Sprain of left ankle       MDM  Pt presents w/ inversion injury left lateral ankle.  Edema and tenderness of lateral malleolus w/ painful ROM on exam.  Xray neg for acute fx.  Suspicious of 2nd or 3rd deg sprain.  Nursing staff placed in cam walker.  Recommended RICE and ibuprofen.  Referred to ortho for persistent sx.          Arie Sabina [place], [place] 12/15/11 [phone]

## 2011-12-15 NOTE — Discharge Instructions (Signed)
Take ibuprofen, up to 800mg  three times a day, as needed for pain.  Ice 3 times a day for 15-20 minutes.  Elevate when possible.  Activity as tolerated.  Follow up with the orthopedic physician if your pain has not stared to improve in 5-7 days.  Ankle Sprain An ankle sprain is an injury to the strong, fibrous tissues (ligaments) that hold the bones of your ankle joint together.  CAUSES Ankle sprain usually is caused by a fall or by twisting your ankle. People who participate in sports are more prone to these types of injuries.  SYMPTOMS  Symptoms of ankle sprain include:  Pain in your ankle. The pain may be present at rest or only when you are trying to stand or walk.   Swelling.   Bruising. Bruising may develop immediately or within 1 to 2 days after your injury.   Difficulty standing or walking.  DIAGNOSIS  Your caregiver will ask you details about your injury and perform a physical exam of your ankle to determine if you have an ankle sprain. During the physical exam, your caregiver will press and squeeze specific areas of your foot and ankle. Your caregiver will try to move your ankle in certain ways. An X-ray exam may be done to be sure a bone was not broken or a ligament did not separate from one of the bones in your ankle (avulsion).  TREATMENT  Certain types of braces can help stabilize your ankle. Your caregiver can make a recommendation for this. Your caregiver may recommend the use of medication for pain. If your sprain is severe, your caregiver may refer you to a surgeon who helps to restore function to parts of your skeletal system (orthopedist) or a physical therapist. HOME CARE INSTRUCTIONS  Apply ice to your injury for 1 to 2 days or as directed by your caregiver. Applying ice helps to reduce inflammation and pain.  Put ice in a plastic bag.   Place a towel between your skin and the bag.   Leave the ice on for 15 to 20 minutes at a time, every 2 hours while you are awake.    Take over-the-counter or prescription medicines for pain, discomfort, or fever only as directed by your caregiver.   Keep your injured leg elevated, when possible, to lessen swelling.   If your caregiver recommends crutches, use them as instructed. Gradually, put weight on the affected ankle. Continue to use crutches or a cane until you can walk without feeling pain in your ankle.   If you have a plaster splint, wear the splint as directed by your caregiver. Do not rest it on anything harder than a pillow the first 24 hours. Do not put weight on it. Do not get it wet. You may take it off to take a shower or bath.   You may have been given an elastic bandage to wear around your ankle to provide support. If the elastic bandage is too tight (you have numbness or tingling in your foot or your foot becomes cold and blue), adjust the bandage to make it comfortable.   If you have an air splint, you may blow more air into it or let air out to make it more comfortable. You may take your splint off at night and before taking a shower or bath.   Wiggle your toes in the splint several times per day if you are able.  SEEK MEDICAL CARE IF:   You have an increase in bruising, swelling,  or pain.   Your toes feel cold.   Pain relief is not achieved with medication.  SEEK IMMEDIATE MEDICAL CARE IF: Your toes are numb or blue or you have severe pain. MAKE SURE YOU:   Understand these instructions.   Will watch your condition.   Will get help right away if you are not doing well or get worse.  Document Released: 07/30/2005 Document Revised: 07/19/2011 Document Reviewed: 03/03/2008 [facility] Patient Information 2012 [place], [place].You may return to the ER if your pain worsens or you have any other concerns.

## 2011-12-15 NOTE — ED Notes (Signed)
Upon returning from radiology pt has elected to remain in wheelchair after using the restroom.  Pt rolled himself from restroom to room and popped a wheely in the wheelchair, pt was asked to keep all 4 wheels on the ground if he wished to remain in the wheelchair.

## 2011-12-15 NOTE — ED Notes (Signed)
Pt twisted left ankle while playing baseball continued to play swelling to left ankle

## 2011-12-16 NOTE — ED Provider Notes (Signed)
Medical screening examination/treatment/procedure(s) were performed by non-physician practitioner and as supervising physician I was immediately available for consultation/collaboration.   Gavin Pound. Oletta Lamas, MD 12/16/11 2355

## 2012-01-08 ENCOUNTER — Telehealth: Payer: Self-pay | Admitting: *Deleted

## 2012-01-08 NOTE — Telephone Encounter (Signed)
Received call from pt stating he has a "little bit of a sore throat, little chest pain and feels very weak". Pt denies radiating pain, sob, nausea or vomiting no diaphoresis. Pt denies difficulty breathing or pain with breathing. Pt reports that there is no tenderness in his chest and denies cough or other symptoms. Pt states that his son was sick with a stomach virus last week (fever and vomiting) and wife is sick this week. Advised pt per [name], NP we would see him in the office if he could come now otherwise he should be evaluated in the ER as soon as possible. Pt states he wants to wait another day to see if symptoms progress. Advised pt we strongly recommend that he be seen in the ER ASAP to r/o cardiac cause for symptoms.  Pt voices understanding but states he will wait another day.

## 2012-02-05 ENCOUNTER — Ambulatory Visit: Payer: BC Managed Care – PPO | Admitting: Internal Medicine

## 2012-05-26 ENCOUNTER — Telehealth: Payer: Self-pay | Admitting: Internal Medicine

## 2012-05-26 MED ORDER — OMEPRAZOLE 40 MG PO CPDR: 40.0000 mg | DELAYED_RELEASE_CAPSULE | Freq: Every day | ORAL | Status: DC

## 2012-05-26 NOTE — Telephone Encounter (Signed)
Done

## 2012-06-23 ENCOUNTER — Other Ambulatory Visit: Payer: Self-pay | Admitting: Internal Medicine

## 2012-06-24 NOTE — Telephone Encounter (Signed)
60

## 2012-06-24 NOTE — Telephone Encounter (Signed)
Pt was last seen on 11/05/11 and given Metoclopramide #60 x 3 refills. Pt was advised follow up in June and did not keep appt.  Please advise re: refill request.

## 2012-06-25 NOTE — Telephone Encounter (Signed)
Rx to pharmacy with notation: *PATIENT DUE FOR OFFICE VISIT PRIOR TO FUTURE REFILLS*/SLS

## 2012-06-27 ENCOUNTER — Other Ambulatory Visit: Payer: Self-pay | Admitting: Internal Medicine

## 2012-07-23 ENCOUNTER — Other Ambulatory Visit: Payer: Self-pay | Admitting: Internal Medicine

## 2012-07-24 ENCOUNTER — Telehealth: Payer: Self-pay | Admitting: Internal Medicine

## 2012-07-24 NOTE — Telephone Encounter (Signed)
REFILL METOCLOPRAMIDE 10 MG TAB TAKE 1 TABLET BY MOUTH 2 TIMES DAILY QTY 60 LAST FILL 06-25-2012

## 2012-07-24 NOTE — Telephone Encounter (Signed)
Metoclopramide denial sent to pharmacy as last refill provided stated pt would need appt for further refills.

## 2012-07-24 NOTE — Telephone Encounter (Signed)
Note was sent to pharmacy with last refill that pt is due for follow up before further refills can be provided. Denial sent to pharmacy for metoclopramide. Pt was last seen in March and recommended f/u in June. Pt has not r/s appt.

## 2012-08-22 ENCOUNTER — Telehealth: Payer: Self-pay | Admitting: Internal Medicine

## 2012-08-22 NOTE — Telephone Encounter (Signed)
Refill- metoclopramide 10mg  tab. Take one tablet(10mg  total) by mouth two times daily. Qty 60 last fill 11.13.13

## 2012-08-22 NOTE — Telephone Encounter (Signed)
Rx DENIED: No further refills on medication w/o prior office visit [see 12.02.13 note below]/SLS  Gregory Fowler, [facility] 07/24/2012 4:12 PM Signed  Note was sent to pharmacy with last refill that pt is due for follow up before further refills can be provided. Denial sent to pharmacy for metoclopramide. Pt was last seen in March and recommended f/u in June. Pt has not r/s appt. Gregory Fowler 07/24/2012 9:53 AM Signed  REFILL METOCLOPRAMIDE 10 MG TAB TAKE 1 TABLET BY MOUTH 2 TIMES DAILY QTY 60 LAST FILL 06-25-2012

## 2012-09-08 ENCOUNTER — Telehealth: Payer: Self-pay | Admitting: Internal Medicine

## 2012-09-08 NOTE — Telephone Encounter (Signed)
Refill- omeprazole dr 40mg  cap. Take one capsule(40mg  total) by mouth daily on empty stomach. Qty 30 last fill 1.9.14  Refill- metoclopramide 10mg  tab. Take one tablet (10mg  total) by mouth two times daily. Qty 60 last fill 11.13.13

## 2012-09-08 NOTE — Telephone Encounter (Signed)
Note was sent to pharmacy with last refill that pt is due for follow up before further refills can be provided. Denial sent to pharmacy for metoclopramide and Omeprazole. Pt was last seen in March and recommended f/u in June. Pt has not r/s appt.

## 2012-09-11 ENCOUNTER — Telehealth: Payer: Self-pay | Admitting: Internal Medicine

## 2012-09-27 ENCOUNTER — Other Ambulatory Visit: Payer: Self-pay | Admitting: Internal Medicine

## 2012-09-29 NOTE — Telephone Encounter (Signed)
Metoclopramide and omeprazole denied as pt has not been seen since 11/03/11. Pt needs appt for further refills. Please call pt to arrange.

## 2012-09-30 NOTE — Telephone Encounter (Signed)
Informed patient of medication denial. He scheduled an appointment for 10/08/12

## 2012-10-08 ENCOUNTER — Ambulatory Visit (INDEPENDENT_AMBULATORY_CARE_PROVIDER_SITE_OTHER): Payer: BC Managed Care – PPO | Admitting: Family

## 2012-10-08 VITALS — BP 124/90 | HR 60 | Temp 98.2°F | Resp 16 | Ht 74.0 in | Wt 228.1 lb

## 2012-10-08 DIAGNOSIS — K7689 Other specified diseases of liver: Secondary | ICD-10-CM

## 2012-10-08 DIAGNOSIS — K3184 Gastroparesis: Secondary | ICD-10-CM

## 2012-10-08 DIAGNOSIS — K219 Gastro-esophageal reflux disease without esophagitis: Secondary | ICD-10-CM

## 2012-10-08 DIAGNOSIS — M549 Dorsalgia, unspecified: Secondary | ICD-10-CM

## 2012-10-08 DIAGNOSIS — R7989 Other specified abnormal findings of blood chemistry: Secondary | ICD-10-CM

## 2012-10-08 DIAGNOSIS — K760 Fatty (change of) liver, not elsewhere classified: Secondary | ICD-10-CM

## 2012-10-08 MED ORDER — OMEPRAZOLE 40 MG PO CPDR: 40.0000 mg | DELAYED_RELEASE_CAPSULE | Freq: Every day | ORAL | Status: DC

## 2012-10-08 MED ORDER — METOCLOPRAMIDE HCL 10 MG PO TABS: ORAL_TABLET | ORAL | Status: DC

## 2012-10-08 NOTE — Patient Instructions (Addendum)
Please complete your lab work prior to leaving. Follow up in 6 months, sooner if problems/concerns.  

## 2012-10-08 NOTE — Progress Notes (Signed)
  Subjective:    Patient ID: Gregory Fowler, male    DOB: 04/21/1976, 37 y.o.   MRN: [id number]  HPI  Gregory Fowler is a 38 yr old male who presents today for follow up.  Gregory Fowler has not been seen by our office in about a year.    Back pain- this has been present for 2 weeks.  Gregory Fowler reports that his pain is in the lower back and shoots pain through both legs.    Fatty liver/elevated LFT's    gerd- reports stable on ppi.  Review of Systems    see i  Past Medical History  Diagnosis Date  . Elevated aspartate aminotransferase level   . Abdominal pain, bilateral lower quadrant   . Backache, unspecified   . Fatty liver     History   Social History  . Marital Status: Married    Spouse Name: N/A    Number of Children: 1  . Years of Education: N/A   Occupational History  .     Social History Main Topics  . Smoking status: Current Every Day Smoker -- 1.00 packs/day    Types: Cigarettes  . Smokeless tobacco: Never Used  . Alcohol Use: Yes     Comment: occasional  . Drug Use: No  . Sexually Active: Not on file   Other Topics Concern  . Not on file   Social History Narrative  . No narrative on file    Past Surgical History  Procedure Laterality Date  . Tonsillectomy      Family History  Problem Relation Age of Onset  . Heart disease Paternal Aunt   . Heart disease Paternal Grandmother   . Liver disease Mother     No Known Allergies  Current Outpatient Prescriptions on File Prior to Visit  Medication Sig Dispense Refill  . cyclobenzaprine (FLEXERIL) 10 MG tablet TAKE ONE TABLET BY MOUTH AT NIGHT AS NEEDED FOR BACK PAIN  30 tablet  0  . fish oil-omega-3 fatty acids 1000 MG capsule Take 1 g by mouth daily.        . Multiple Vitamin (MULITIVITAMIN WITH MINERALS) TABS Take 1 tablet by mouth daily.       No current facility-administered medications on file prior to visit.    BP 124/90  Pulse 60  Temp(Src) 98.2 F (36.8 C) (Oral)  Resp 16  Ht 6\' 2"  (1.88 m)   Wt 228 lb 1.9 oz (103.475 kg)  BMI 29.28 kg/m2    Objective:   Physical Exam  Constitutional: Gregory Fowler is oriented to person, place, and time. Gregory Fowler appears well-developed and well-nourished. No distress.  Cardiovascular: Normal rate and regular rhythm.   No murmur heard. Pulmonary/Chest: Effort normal and breath sounds normal. No respiratory distress. Gregory Fowler has no wheezes. Gregory Fowler has no rales. Gregory Fowler exhibits no tenderness.  Musculoskeletal: Gregory Fowler exhibits no edema.  Neurological: Gregory Fowler is alert and oriented to person, place, and time.          Assessment & Plan:  Pt very angry today [id number] to return to office for follow up and refills. Explained importance of keeping recommended follow up apts.

## 2012-10-09 LAB — HEPATITIS C ANTIBODY: HCV Ab: NEGATIVE

## 2012-10-09 LAB — HEPATIC FUNCTION PANEL
Bilirubin, Direct: 0.1 mg/dL (ref 0.0–0.3)
Total Bilirubin: 0.4 mg/dL (ref 0.3–1.2)

## 2012-10-09 LAB — HEPATITIS B SURFACE ANTIGEN: Hepatitis B Surface Ag: NEGATIVE

## 2012-10-09 LAB — HEPATITIS B SURFACE ANTIBODY,QUALITATIVE: Hep B S Ab: NONREACTIVE

## 2012-10-10 ENCOUNTER — Encounter: Payer: Self-pay | Admitting: Family

## 2012-10-11 DIAGNOSIS — K3184 Gastroparesis: Secondary | ICD-10-CM | POA: Insufficient documentation

## 2012-10-11 NOTE — Assessment & Plan Note (Signed)
Follow up lft stable. Hepatitis screen, ferritin NL.

## 2012-10-11 NOTE — Assessment & Plan Note (Signed)
Stable on ppi. Cont same.

## 2012-10-11 NOTE — Assessment & Plan Note (Signed)
Noted per egd. Cont reglan.

## 2012-10-11 NOTE — Assessment & Plan Note (Signed)
Cont prn flexeril.

## 2012-12-17 ENCOUNTER — Ambulatory Visit (INDEPENDENT_AMBULATORY_CARE_PROVIDER_SITE_OTHER): Payer: BC Managed Care – PPO | Admitting: Family

## 2012-12-17 ENCOUNTER — Encounter: Payer: Self-pay | Admitting: Family

## 2012-12-17 VITALS — BP 122/80 | HR 90 | Temp 99.1°F | Resp 16

## 2012-12-17 DIAGNOSIS — R1110 Vomiting, unspecified: Secondary | ICD-10-CM

## 2012-12-17 DIAGNOSIS — K529 Noninfective gastroenteritis and colitis, unspecified: Secondary | ICD-10-CM

## 2012-12-17 DIAGNOSIS — E785 Hyperlipidemia, unspecified: Secondary | ICD-10-CM

## 2012-12-17 DIAGNOSIS — K5289 Other specified noninfective gastroenteritis and colitis: Secondary | ICD-10-CM

## 2012-12-17 DIAGNOSIS — K219 Gastro-esophageal reflux disease without esophagitis: Secondary | ICD-10-CM

## 2012-12-17 LAB — CBC WITH DIFFERENTIAL/PLATELET
Basophils Absolute: 0 10*3/uL (ref 0.0–0.1)
HCT: 39.4 % (ref 39.0–52.0)
Hemoglobin: 14.5 g/dL (ref 13.0–17.0)
Lymphocytes Relative: 15 % (ref 12–46)
Monocytes Absolute: 0.9 10*3/uL (ref 0.1–1.0)
Monocytes Relative: 12 % (ref 3–12)
Neutro Abs: 5.6 10*3/uL (ref 1.7–7.7)
RDW: 13.5 % (ref 11.5–15.5)
WBC: 7.9 10*3/uL (ref 4.0–10.5)

## 2012-12-17 LAB — HEPATIC FUNCTION PANEL
ALT: 83 U/L — ABNORMAL HIGH (ref 0–53)
Bilirubin, Direct: 0.1 mg/dL (ref 0.0–0.3)
Indirect Bilirubin: 0.4 mg/dL (ref 0.0–0.9)

## 2012-12-17 LAB — LIPASE: Lipase: 32 U/L (ref 0–75)

## 2012-12-17 MED ORDER — PANTOPRAZOLE SODIUM 40 MG PO TBEC: 40.0000 mg | DELAYED_RELEASE_TABLET | Freq: Every day | ORAL | Status: DC

## 2012-12-17 NOTE — Patient Instructions (Signed)
You can try imodium as needed for diarrhea. This is available over the counter.   Please complete lab work prior to leaving. Make sure you are drinking plenty of liquids.   Go to ER if severe abdominal pain, or if you are unable to keep down food/liquid/medicine. Call if diarrhea/nausea are not improved in 2-3 days.

## 2012-12-17 NOTE — Progress Notes (Signed)
Subjective:    Patient ID: Gregory Fowler, male    DOB: [birth year], 37 y.o.   MRN: [id number]  HPI  Gregory Fowler is a 37 yr old male who presents today with chief complaint of abdominal pain.  Pain started this AM and is associated with nausea and vomitting. He has kept down soda and chicken noodle soup. No sick contacts.  Did have associated episode of epistaxis with the (several loose BM's a day) vomiting.  He also reports hx of diarrhea x 3 weeks.  Diarrhea has been works for the last 2 days. He reports some chills today.  Denies black/bloody stools.    GERD- reports no improvement in reflux symptoms with prilosec.  Review of Systems See HPI  Past Medical History  Diagnosis Date  . Nonspecific elevation of levels of transaminase or lactic acid dehydrogenase (LDH)   . Abdominal  pain, other specified site   . Backache, unspecified   . Fatty liver     History   Social History  . Marital Status: Married    Spouse Name: N/A    Number of Children: 1  . Years of Education: N/A   Occupational History  .     Social History Main Topics  . Smoking status: Current Every Day Smoker -- 1.00 packs/day    Types: Cigarettes  . Smokeless tobacco: Never Used  . Alcohol Use: Yes     Comment: occasional  . Drug Use: No  . Sexually Active: Not on file   Other Topics Concern  . Not on file   Social History Narrative  . No narrative on file    Past Surgical History  Procedure Laterality Date  . Tonsillectomy      Family History  Problem Relation Age of Onset  . Heart disease Paternal Aunt   . Heart disease Paternal Grandmother   . Liver disease Mother     No Known Allergies  Current Outpatient Prescriptions on File Prior to Visit  Medication Sig Dispense Refill  . metoCLOPramide (REGLAN) 10 MG tablet TAKE 1 TABLET (10 MG TOTAL) BY MOUTH 2 (TWO) TIMES DAILY.  60 tablet  5  . Multiple Vitamin (MULITIVITAMIN WITH MINERALS) TABS Take 1 tablet by mouth daily.      Marland Kitchen  omeprazole (PRILOSEC) 40 MG capsule Take 1 capsule (40 mg total) by mouth daily.  30 capsule  5  . cyclobenzaprine (FLEXERIL) 10 MG tablet TAKE ONE TABLET BY MOUTH AT NIGHT AS NEEDED FOR BACK PAIN  30 tablet  0  . fish oil-omega-3 fatty acids 1000 MG capsule Take 1 g by mouth daily.         No current facility-administered medications on file prior to visit.    BP 122/80  Pulse 90  Temp(Src) 99.1 F (37.3 C) (Oral)  Resp 16  SpO2 99%       Objective:   Physical Exam  Constitutional: He is oriented to person, place, and time. He appears well-developed and well-nourished. No distress.  HENT:  Head: Normocephalic and atraumatic.  Cardiovascular: Normal rate and regular rhythm.   No murmur heard. Pulmonary/Chest: Effort normal and breath sounds normal. No respiratory distress. He has no wheezes. He has no rales. He exhibits no tenderness.  Abdominal: Soft. Bowel sounds are normal. He exhibits no distension and no mass. There is no tenderness. There is no rebound and no guarding.  Musculoskeletal: He exhibits no edema.  Neurological: He is alert and oriented to person, place, and time.  Skin:  Skin is warm and dry.  Psychiatric: He has a normal mood and affect. His behavior is normal. Judgment and thought content normal.          Assessment & Plan:

## 2012-12-18 ENCOUNTER — Encounter: Payer: Self-pay | Admitting: Family

## 2012-12-19 DIAGNOSIS — K529 Noninfective gastroenteritis and colitis, unspecified: Secondary | ICD-10-CM | POA: Insufficient documentation

## 2012-12-19 NOTE — Assessment & Plan Note (Signed)
Unchanged. D/C omeprazole, trial of protonix.

## 2012-12-19 NOTE — Assessment & Plan Note (Signed)
Suspect viral Gastroenteritis.  We did discuss referral back to GI but he declines at this time due to cost and "they didn't do anything for me." Lipase, lft, cbc stable. rec imodium prn and call if symptoms worsen or if symptoms doe not improve.

## 2013-03-31 ENCOUNTER — Ambulatory Visit (INDEPENDENT_AMBULATORY_CARE_PROVIDER_SITE_OTHER): Payer: BC Managed Care – PPO | Admitting: Family

## 2013-03-31 ENCOUNTER — Encounter: Payer: Self-pay | Admitting: Family

## 2013-03-31 VITALS — BP 138/78 | HR 90 | Temp 97.6°F | Ht 74.0 in | Wt 227.1 lb

## 2013-03-31 DIAGNOSIS — K3184 Gastroparesis: Secondary | ICD-10-CM

## 2013-03-31 DIAGNOSIS — K219 Gastro-esophageal reflux disease without esophagitis: Secondary | ICD-10-CM

## 2013-03-31 DIAGNOSIS — F418 Other specified anxiety disorders: Secondary | ICD-10-CM | POA: Insufficient documentation

## 2013-03-31 DIAGNOSIS — K760 Fatty (change of) liver, not elsewhere classified: Secondary | ICD-10-CM

## 2013-03-31 DIAGNOSIS — K7689 Other specified diseases of liver: Secondary | ICD-10-CM

## 2013-03-31 DIAGNOSIS — F411 Generalized anxiety disorder: Secondary | ICD-10-CM

## 2013-03-31 DIAGNOSIS — F17200 Nicotine dependence, unspecified, uncomplicated: Secondary | ICD-10-CM

## 2013-03-31 NOTE — Progress Notes (Signed)
  Subjective:    Patient ID: Gregory Fowler, male    DOB: [birth year], 37 y.o.   MRN: [id number]  HPI  Gregory Fowler is a 37 yr old male who presents today for follow up.  1) Fatty liver- admits to non-compliance with diet.  "you only live once"  2) GERD- reports that he continues to have burping.  He uses the reglan one time a day.  Did not notice any improvement in symptoms with change from omeprazole to protonix.   3) Stress- reports that he has a lot of stress at his job.  Reports that when he gets stressed his face and ears get red and he feels light headed. Concerned that this could be related to his blood pressure.   Review of Systems    see HPI  Past Medical History  Diagnosis Date  . Nonspecific elevation of levels of transaminase or lactic acid dehydrogenase (LDH)   . Abdominal  pain, other specified site   . Backache, unspecified   . Fatty liver     History   Social History  . Marital Status: Married    Spouse Name: N/A    Number of Children: 1  . Years of Education: N/A   Occupational History  .     Social History Main Topics  . Smoking status: Current Every Day Smoker -- 1.00 packs/day    Types: Cigarettes  . Smokeless tobacco: Never Used  . Alcohol Use: Yes     Comment: occasional  . Drug Use: No  . Sexual Activity: Not on file   Other Topics Concern  . Not on file   Social History Narrative  . No narrative on file    Past Surgical History  Procedure Laterality Date  . Tonsillectomy      Family History  Problem Relation Age of Onset  . Heart disease Paternal Aunt   . Heart disease Paternal Grandmother   . Liver disease Mother     No Known Allergies  Current Outpatient Prescriptions on File Prior to Visit  Medication Sig Dispense Refill  . metoCLOPramide (REGLAN) 10 MG tablet TAKE 1 TABLET (10 MG TOTAL) BY MOUTH 2 (TWO) TIMES DAILY.  60 tablet  5  . pantoprazole (PROTONIX) 40 MG tablet Take 1 tablet (40 mg total) by mouth daily.  30  tablet  3  . Multiple Vitamin (MULITIVITAMIN WITH MINERALS) TABS Take 1 tablet by mouth daily.       No current facility-administered medications on file prior to visit.    BP 138/78  Pulse 90  Temp(Src) 97.6 F (36.4 C) (Oral)  Ht 6\' 2"  (1.88 m)  Wt 227 lb 1.9 oz (103.021 kg)  BMI 29.15 kg/m2  SpO2 96%    Objective:   Physical Exam  Constitutional: He is oriented to person, place, and time. He appears well-developed and well-nourished. No distress.  HENT:  Head: Normocephalic and atraumatic.  Cardiovascular: Normal rate and regular rhythm.   No murmur heard. Pulmonary/Chest: Effort normal and breath sounds normal. No respiratory distress. He has no wheezes. He has no rales. He exhibits no tenderness.  Musculoskeletal: He exhibits no edema.  Neurological: He is alert and oriented to person, place, and time.  Psychiatric: He has a normal mood and affect. His behavior is normal. Judgment and thought content normal.  Slightly angry affect          Assessment & Plan:

## 2013-03-31 NOTE — Assessment & Plan Note (Signed)
Continue PPI, continue gerd diet.

## 2013-03-31 NOTE — Assessment & Plan Note (Signed)
We discussed potential risks associated with long term reglan use versus benefits.  He will consider increasing frequency to bid due to ongoing symptoms.

## 2013-03-31 NOTE — Assessment & Plan Note (Signed)
I think his symptoms that occur at work are anxiety related to high stress at his work.  His BP looks ok today here in the office. We discussed trying to limit work place stress as much as possible.

## 2013-03-31 NOTE — Assessment & Plan Note (Signed)
Reinforced importance of low fat/low cholesterol diet and exercise.  

## 2013-03-31 NOTE — Patient Instructions (Addendum)
Please follow up in 6 months. Increase reglan to twice daily. Avoid fried/fatty foods. Work on getting regular exercise. It is important for your health that you quit smoking.

## 2013-03-31 NOTE — Assessment & Plan Note (Signed)
We discussed smoking cessation.  He refuses to quit smoking at this time.

## 2013-04-06 ENCOUNTER — Ambulatory Visit: Payer: BC Managed Care – PPO | Admitting: Family

## 2013-04-14 ENCOUNTER — Telehealth: Payer: Self-pay | Admitting: Family

## 2013-04-14 DIAGNOSIS — Z302 Encounter for sterilization: Secondary | ICD-10-CM

## 2013-04-14 NOTE — Telephone Encounter (Signed)
Patient would like to have a referral to get a vasectomy.

## 2013-04-20 ENCOUNTER — Other Ambulatory Visit: Payer: Self-pay | Admitting: Family

## 2013-05-06 ENCOUNTER — Encounter: Payer: Self-pay | Admitting: Physician Assistant

## 2013-05-06 ENCOUNTER — Ambulatory Visit (INDEPENDENT_AMBULATORY_CARE_PROVIDER_SITE_OTHER): Payer: BC Managed Care – PPO | Admitting: Physician Assistant

## 2013-05-06 VITALS — BP 128/82 | HR 84 | Temp 97.9°F | Resp 18 | Ht 74.0 in | Wt 227.2 lb

## 2013-05-06 DIAGNOSIS — J329 Chronic sinusitis, unspecified: Secondary | ICD-10-CM

## 2013-05-06 MED ORDER — AMOXICILLIN-POT CLAVULANATE 875-125 MG PO TABS: 1.0000 | ORAL_TABLET | Freq: Two times a day (BID) | ORAL | Status: DC

## 2013-05-06 NOTE — Assessment & Plan Note (Signed)
Given worsening tooth pain, will go ahead and prescribe ABX.  Rx Augmentin.  Increase water intake.  Get plenty of rest.  Saline nasal spray.  Can continue OTC measures.

## 2013-05-06 NOTE — Progress Notes (Signed)
Patient ID: Gregory Fowler, male   DOB: 1976-04-07, 37 y.o.   MRN: [id number]  Patient presents to clinic today c/o 5-6 days of sinus pressure, pain, congestions. green nasal discharge and ear pain.  Patient endorses tooth pain.  Denies fever, chills, myalgias, cough, sore throat.  Patient has been taking sudafed with some relief of symptoms.  Feels that the tooth pain and sinus pressure is worsening.  Denies sick contact within the home.   Past Medical History  Diagnosis Date  . Nonspecific elevation of levels of transaminase or lactic acid dehydrogenase (LDH)   . Abdominal  pain, other specified site   . Backache, unspecified   . Fatty liver     Current Outpatient Prescriptions on File Prior to Visit  Medication Sig Dispense Refill  . metoCLOPramide (REGLAN) 10 MG tablet TAKE 1 TABLET (10 MG TOTAL) BY MOUTH 2 (TWO) TIMES DAILY.  60 tablet  5  . Multiple Vitamin (MULITIVITAMIN WITH MINERALS) TABS Take 1 tablet by mouth daily.      . pantoprazole (PROTONIX) 40 MG tablet TAKE 1 TABLET BY MOUTH DAILY  30 tablet  5   No current facility-administered medications on file prior to visit.    No Known Allergies  Family History  Problem Relation Age of Onset  . Heart disease Paternal Aunt   . Heart disease Paternal Grandmother   . Liver disease Mother     History   Social History  . Marital Status: Married    Spouse Name: N/A    Number of Children: 1  . Years of Education: N/A   Occupational History  .     Social History Main Topics  . Smoking status: Current Every Day Smoker -- 1.00 packs/day    Types: Cigarettes  . Smokeless tobacco: Never Used  . Alcohol Use: Yes     Comment: occasional  . Drug Use: No  . Sexual Activity: None   Other Topics Concern  . None   Social History Narrative  . None   Review of Systems  Constitutional: Negative for fever and chills.  HENT: Positive for ear pain and congestion. Negative for hearing loss, sore throat, tinnitus and ear  discharge.   Respiratory: Negative for cough, shortness of breath and wheezing.   Gastrointestinal: Negative for nausea and vomiting.  Musculoskeletal: Negative for myalgias.  Neurological: Positive for headaches.  Endo/Heme/Allergies: Negative for environmental allergies.   Filed Vitals:   05/06/13 1124  BP: 128/82  Pulse: 84  Temp: 97.9 F (36.6 C)  Resp: 18   Physical Exam  Vitals reviewed. Constitutional: He is oriented to person, place, and time and well-developed, well-nourished, and in no distress.  HENT:  Head: Normocephalic and atraumatic.  Right Ear: External ear normal.  Left Ear: External ear normal.  Yellow-green post-nasal drainage noticed on examination of the pharynx.  Positive tenderness to palpation of sinuses.  Eyes: Conjunctivae and EOM are normal.  Neck: Neck supple.  Cardiovascular: Normal rate, regular rhythm and normal heart sounds.   Pulmonary/Chest: Effort normal and breath sounds normal. No respiratory distress. He has no wheezes.  Lymphadenopathy:    He has no cervical adenopathy.  Neurological: He is alert and oriented to person, place, and time.  Skin: Skin is warm and dry. No rash noted.   Assessment/Plan: Sinusitis Given worsening tooth pain, will go ahead and prescribe ABX.  Rx Augmentin.  Increase water intake.  Get plenty of rest.  Saline nasal spray.  Can continue OTC measures.

## 2013-05-06 NOTE — Patient Instructions (Signed)
Please get plenty of rest and drink plenty of fluids.  Take antibiotic as prescribed until all pills are gone.  Please use saline nasal spray.  You can continue Sudafed as long as your heart is not racing.    Sinusitis Sinusitis is redness, soreness, and swelling (inflammation) of the paranasal sinuses. Paranasal sinuses are air pockets within the bones of your face (beneath the eyes, the middle of the forehead, or above the eyes). In healthy paranasal sinuses, mucus is able to drain out, and air is able to circulate through them by way of your nose. However, when your paranasal sinuses are inflamed, mucus and air can become trapped. This can allow bacteria and other germs to grow and cause infection. Sinusitis can develop quickly and last only a short time (acute) or continue over a long period (chronic). Sinusitis that lasts for more than 12 weeks is considered chronic.  CAUSES  Causes of sinusitis include:  Allergies.  Structural abnormalities, such as displacement of the cartilage that separates your nostrils (deviated septum), which can decrease the air flow through your nose and sinuses and affect sinus drainage.  Functional abnormalities, such as when the small hairs (cilia) that line your sinuses and help remove mucus do not work properly or are not present. SYMPTOMS  Symptoms of acute and chronic sinusitis are the same. The primary symptoms are pain and pressure around the affected sinuses. Other symptoms include:  Upper toothache.  Earache.  Headache.  Bad breath.  Decreased sense of smell and taste.  A cough, which worsens when you are lying flat.  Fatigue.  Fever.  Thick drainage from your nose, which often is green and may contain pus (purulent).  Swelling and warmth over the affected sinuses. DIAGNOSIS  Your caregiver will perform a physical exam. During the exam, your caregiver may:  Look in your nose for signs of abnormal growths in your nostrils (nasal  polyps).  Tap over the affected sinus to check for signs of infection.  View the inside of your sinuses (endoscopy) with a special imaging device with a light attached (endoscope), which is inserted into your sinuses. If your caregiver suspects that you have chronic sinusitis, one or more of the following tests may be recommended:  Allergy tests.  Nasal culture A sample of mucus is taken from your nose and sent to a lab and screened for bacteria.  Nasal cytology A sample of mucus is taken from your nose and examined by your caregiver to determine if your sinusitis is related to an allergy. TREATMENT  Most cases of acute sinusitis are related to a viral infection and will resolve on their own within 10 days. Sometimes medicines are prescribed to help relieve symptoms (pain medicine, decongestants, nasal steroid sprays, or saline sprays).  However, for sinusitis related to a bacterial infection, your caregiver will prescribe antibiotic medicines. These are medicines that will help kill the bacteria causing the infection.  Rarely, sinusitis is caused by a fungal infection. In theses cases, your caregiver will prescribe antifungal medicine. For some cases of chronic sinusitis, surgery is needed. Generally, these are cases in which sinusitis recurs more than 3 times per year, despite other treatments. HOME CARE INSTRUCTIONS   Drink plenty of water. Water helps thin the mucus so your sinuses can drain more easily.  Use a humidifier.  Inhale steam 3 to 4 times a day (for example, sit in the bathroom with the shower running).  Apply a warm, moist washcloth to your face 3 to  4 times a day, or as directed by your caregiver.  Use saline nasal sprays to help moisten and clean your sinuses.  Take over-the-counter or prescription medicines for pain, discomfort, or fever only as directed by your caregiver. SEEK IMMEDIATE MEDICAL CARE IF:  You have increasing pain or severe headaches.  You have  nausea, vomiting, or drowsiness.  You have swelling around your face.  You have vision problems.  You have a stiff neck.  You have difficulty breathing. MAKE SURE YOU:   Understand these instructions.  Will watch your condition.  Will get help right away if you are not doing well or get worse. Document Released: 07/30/2005 Document Revised: 10/22/2011 Document Reviewed: 08/14/2011 [facility] Patient Information 2014 [place], [place].

## 2013-08-11 ENCOUNTER — Ambulatory Visit (INDEPENDENT_AMBULATORY_CARE_PROVIDER_SITE_OTHER): Payer: BC Managed Care – PPO | Admitting: Nurse Practitioner

## 2013-08-11 ENCOUNTER — Encounter: Payer: Self-pay | Admitting: Nurse Practitioner

## 2013-08-11 VITALS — BP 120/70 | HR 84 | Temp 98.0°F | Ht 74.0 in | Wt 230.0 lb

## 2013-08-11 DIAGNOSIS — J069 Acute upper respiratory infection, unspecified: Secondary | ICD-10-CM

## 2013-08-11 NOTE — Patient Instructions (Signed)
You have a cold virus causing your symptoms. The average duration of cold symptoms is 14 days. Start daily sinus rinses (neilmed Sinus Rinse). Use 30 mg to 60 mg pseudoephedrine twice daily. Sip fluids every hour. Rest. If you are not feeling better in 1 week or develop fever or chest pain, call us for re-evaluation. Feel better!  Upper Respiratory Infection, Adult An upper respiratory infection (URI) is also sometimes known as the common cold. The upper respiratory tract includes the nose, sinuses, throat, trachea, and bronchi. Bronchi are the airways leading to the lungs. Most people improve within 1 week, but symptoms can last up to 2 weeks. A residual cough may last even longer.  CAUSES Many different viruses can infect the tissues lining the upper respiratory tract. The tissues become irritated and inflamed and often become very moist. Mucus production is also common. A cold is contagious. You can easily spread the virus to others by oral contact. This includes kissing, sharing a glass, coughing, or sneezing. Touching your mouth or nose and then touching a surface, which is then touched by another person, can also spread the virus. SYMPTOMS  Symptoms typically develop 1 to 3 days after you come in contact with a cold virus. Symptoms vary from person to person. They may include:  Runny nose.  Sneezing.  Nasal congestion.  Sinus irritation.  Sore throat.  Loss of voice (laryngitis).  Cough.  Fatigue.  Muscle aches.  Loss of appetite.  Headache.  Low-grade fever. DIAGNOSIS  You might diagnose your own cold based on familiar symptoms, since most people get a cold 2 to 3 times a year. Your caregiver can confirm this based on your exam. Most importantly, your caregiver can check that your symptoms are not due to another disease such as strep throat, sinusitis, pneumonia, asthma, or epiglottitis. Blood tests, throat tests, and X-rays are not necessary to diagnose a common cold, but  they may sometimes be helpful in excluding other more serious diseases. Your caregiver will decide if any further tests are required. RISKS AND COMPLICATIONS  You may be at risk for a more severe case of the common cold if you smoke cigarettes, have chronic heart disease (such as heart failure) or lung disease (such as asthma), or if you have a weakened immune system. The very young and very old are also at risk for more serious infections. Bacterial sinusitis, middle ear infections, and bacterial pneumonia can complicate the common cold. The common cold can worsen asthma and chronic obstructive pulmonary disease (COPD). Sometimes, these complications can require emergency medical care and may be life-threatening. PREVENTION  The best way to protect against getting a cold is to practice good hygiene. Avoid oral or hand contact with people with cold symptoms. Wash your hands often if contact occurs. There is no clear evidence that vitamin C, vitamin E, echinacea, or exercise reduces the chance of developing a cold. However, it is always recommended to get plenty of rest and practice good nutrition. TREATMENT  Treatment is directed at relieving symptoms. There is no cure. Antibiotics are not effective, because the infection is caused by a virus, not by bacteria. Treatment may include:  Increased fluid intake. Sports drinks offer valuable electrolytes, sugars, and fluids.  Breathing heated mist or steam (vaporizer or shower).  Eating chicken soup or other clear broths, and maintaining good nutrition.  Getting plenty of rest.  Using gargles or lozenges for comfort.  Controlling fevers with ibuprofen or acetaminophen as directed by your caregiver.    Increasing usage of your inhaler if you have asthma. Zinc gel and zinc lozenges, taken in the first 24 hours of the common cold, can shorten the duration and lessen the severity of symptoms. Pain medicines may help with fever, muscle aches, and throat  pain. A variety of non-prescription medicines are available to treat congestion and runny nose. Your caregiver can make recommendations and may suggest nasal or lung inhalers for other symptoms.  HOME CARE INSTRUCTIONS   Only take over-the-counter or prescription medicines for pain, discomfort, or fever as directed by your caregiver.  Use a warm mist humidifier or inhale steam from a shower to increase air moisture. This may keep secretions moist and make it easier to breathe.  Drink enough water and fluids to keep your urine clear or pale yellow.  Rest as needed.  Return to work when your temperature has returned to normal or as your caregiver advises. You may need to stay home longer to avoid infecting others. You can also use a face mask and careful hand washing to prevent spread of the virus. SEEK MEDICAL CARE IF:   After the first few days, you feel you are getting worse rather than better.  You need your caregiver's advice about medicines to control symptoms.  You develop chills, worsening shortness of breath, or brown or red sputum. These may be signs of pneumonia.  You develop yellow or brown nasal discharge or pain in the face, especially when you bend forward. These may be signs of sinusitis.  You develop a fever, swollen neck glands, pain with swallowing, or white areas in the back of your throat. These may be signs of strep throat. SEEK IMMEDIATE MEDICAL CARE IF:   You have a fever.  You develop severe or persistent headache, ear pain, sinus pain, or chest pain.  You develop wheezing, a prolonged cough, cough up blood, or have a change in your usual mucus (if you have chronic lung disease).  You develop sore muscles or a stiff neck. Document Released: 01/23/2001 Document Revised: 10/22/2011 Document Reviewed: 12/01/2010 [employer] Patient Information 2014 [employer]. 

## 2013-08-11 NOTE — Progress Notes (Signed)
   Subjective:    Patient ID: Gregory Fowler, male    DOB: Sep 20, 1975, 37 y.o.   MRN: [id number]  URI  This is a new problem. The current episode started yesterday (chills yesterday. nasal congestion off & on for few weeks). The problem has been waxing and waning. There has been no fever. Associated symptoms include congestion and headaches. Pertinent negatives include no abdominal pain, chest pain (states chest feels congested, not tight, no cough), coughing, diarrhea, nausea, sore throat or wheezing. Treatments tried: nyquil.      Review of Systems  Constitutional: Positive for chills and fatigue. Negative for fever.  HENT: Positive for congestion. Negative for postnasal drip and sore throat.   Respiratory: Negative for cough, chest tightness, shortness of breath and wheezing.        Reports feels congested, but no cough or tightness  Cardiovascular: Negative for chest pain (states chest feels congested, not tight, no cough).  Gastrointestinal: Negative for nausea, abdominal pain and diarrhea.  Musculoskeletal: Negative for back pain.  Neurological: Positive for headaches.  Hematological: Negative for adenopathy.       Objective:   Physical Exam  Vitals reviewed. Constitutional: He is oriented to person, place, and time. He appears well-developed and well-nourished. No distress.  HENT:  Head: Normocephalic and atraumatic.  Right Ear: External ear normal.  Left Ear: External ear normal.  Mouth/Throat: Oropharynx is clear and moist. No oropharyngeal exudate.  Sounds nasal  Eyes: Conjunctivae are normal. Right eye exhibits no discharge. Left eye exhibits no discharge.  Neck: Normal range of motion. Neck supple. No thyromegaly present.  Cardiovascular: Normal rate, regular rhythm and normal heart sounds.   Pulmonary/Chest: Effort normal and breath sounds normal. No respiratory distress. He has no wheezes.  Lymphadenopathy:    He has no cervical adenopathy.  Neurological: He  is alert and oriented to person, place, and time.  Skin: Skin is warm and dry.  Psychiatric: He has a normal mood and affect. His behavior is normal. Thought content normal.          Assessment & Plan:  1. Viral upper respiratory illness No fever. See pt instructions.

## 2013-08-11 NOTE — Progress Notes (Signed)
Pre-visit discussion using our clinic review tool. No additional management support is needed unless otherwise documented below in the visit note.  

## 2013-08-13 ENCOUNTER — Other Ambulatory Visit: Payer: Self-pay | Admitting: Family

## 2013-08-14 NOTE — Telephone Encounter (Signed)
Rx request to pharmacy/SLS PATIENT DUE FOR FOLLOW-UP OFFICE VISIT  

## 2013-09-19 ENCOUNTER — Other Ambulatory Visit: Payer: Self-pay | Admitting: Family

## 2013-09-21 NOTE — Telephone Encounter (Signed)
Rx request to pharmacy; **OFFICE VISIT NEEDED PRIOR TO FUTURE REFILLS**/SLS

## 2013-10-21 ENCOUNTER — Other Ambulatory Visit: Payer: Self-pay | Admitting: Family

## 2013-11-25 NOTE — Telephone Encounter (Signed)
Lab order

## 2013-12-08 ENCOUNTER — Telehealth: Payer: Self-pay | Admitting: Family

## 2013-12-08 ENCOUNTER — Encounter: Payer: Self-pay | Admitting: Family

## 2013-12-08 ENCOUNTER — Ambulatory Visit (INDEPENDENT_AMBULATORY_CARE_PROVIDER_SITE_OTHER): Payer: BC Managed Care – PPO | Admitting: Family

## 2013-12-08 VITALS — BP 110/86 | HR 86 | Temp 97.7°F | Resp 18 | Ht 74.0 in | Wt 225.1 lb

## 2013-12-08 DIAGNOSIS — J329 Chronic sinusitis, unspecified: Secondary | ICD-10-CM

## 2013-12-08 MED ORDER — AMOXICILLIN 500 MG PO CAPS: 500.0000 mg | ORAL_CAPSULE | Freq: Three times a day (TID) | ORAL | Status: DC

## 2013-12-08 NOTE — Patient Instructions (Signed)

## 2013-12-08 NOTE — Telephone Encounter (Signed)
Relevant patient education mailed to patient.  

## 2013-12-08 NOTE — Progress Notes (Signed)
Pre visit review using our clinic review tool, if applicable. No additional management support is needed unless otherwise documented below in the visit note. 

## 2013-12-08 NOTE — Progress Notes (Signed)
   Subjective:    Patient ID: Gregory Fowler, male    DOB: 11-22-1975, 38 y.o.   MRN: [id number]  HPI  Gregory Fowler is a 38 yr old male who presents today with chief complaint of nasal congestion. Several week hx of of nasal congestion, sinus pain/pressure.  Yellow green nasal discharge. Has been taking zyrtec without significant improvement.  Denies fever, trouble sleeping due to congestion.     Review of Systems See HPI  Past Medical History  Diagnosis Date  . Nonspecific elevation of levels of transaminase or lactic acid dehydrogenase (LDH)   . Abdominal pain, other specified site   . Backache, unspecified   . Fatty liver     History   Social History  . Marital Status: Married    Spouse Name: N/A    Number of Children: 1  . Years of Education: N/A   Occupational History  .     Social History Main Topics  . Smoking status: Current Every Day Smoker -- 1.00 packs/day    Types: Cigarettes  . Smokeless tobacco: Never Used  . Alcohol Use: Yes     Comment: occasional  . Drug Use: No  . Sexual Activity: Not on file   Other Topics Concern  . Not on file   Social History Narrative  . No narrative on file    Past Surgical History  Procedure Laterality Date  . Tonsillectomy      Family History  Problem Relation Age of Onset  . Heart disease Paternal Aunt   . Heart disease Paternal Grandmother   . Liver disease Mother     No Known Allergies  Current Outpatient Prescriptions on File Prior to Visit  Medication Sig Dispense Refill  . metoCLOPramide (REGLAN) 10 MG tablet TAKE 1 TABLET (10 MG TOTAL) BY MOUTH 2 (TWO) TIMES DAILY.  **NEED OV**  60 tablet  0  . pantoprazole (PROTONIX) 40 MG tablet TAKE 1 TABLET BY MOUTH DAILY  30 tablet  4   No current facility-administered medications on file prior to visit.    BP 110/86  Pulse 86  Temp(Src) 97.7 F (36.5 C) (Oral)  Resp 18  Ht 6\' 2"  (1.88 m)  Wt 225 lb 1.9 oz (102.114 kg)  BMI 28.89 kg/m2  SpO2  99%       Objective:   Physical Exam  Constitutional: He is oriented to person, place, and time. He appears well-developed and well-nourished. No distress.  HENT:  Head: Normocephalic and atraumatic.  Right Ear: Tympanic membrane and ear canal normal.  Left Ear: Tympanic membrane and ear canal normal.  Mouth/Throat: No oropharyngeal exudate, posterior oropharyngeal edema or posterior oropharyngeal erythema.  + maxillary/sinus tenderness to palpation.   Cardiovascular: Normal rate and regular rhythm.   No murmur heard. Pulmonary/Chest: Effort normal and breath sounds normal. No respiratory distress. He has no wheezes. He has no rales. He exhibits no tenderness.  Neurological: He is alert and oriented to person, place, and time.  Psychiatric: He has a normal mood and affect. His behavior is normal. Judgment and thought content normal.          Assessment & Plan:

## 2013-12-10 NOTE — Assessment & Plan Note (Signed)
Will Rx with amoxicillin. Call if symptoms worsen or if symptoms do not improve.

## 2014-02-17 ENCOUNTER — Other Ambulatory Visit: Payer: Self-pay | Admitting: Family

## 2014-03-16 ENCOUNTER — Telehealth: Payer: Self-pay | Admitting: Family

## 2014-03-16 NOTE — Telephone Encounter (Signed)
Requesting refill on protonix and reglan, please advise. [employer]

## 2014-03-17 MED ORDER — METOCLOPRAMIDE HCL 10 MG PO TABS: ORAL_TABLET | ORAL | Status: DC

## 2014-03-17 MED ORDER — PANTOPRAZOLE SODIUM 40 MG PO TBEC: DELAYED_RELEASE_TABLET | ORAL | Status: DC

## 2014-03-17 NOTE — Telephone Encounter (Signed)
Left message for patient to return my call.

## 2014-03-17 NOTE — Telephone Encounter (Signed)
Informed patient of medication refill and he scheduled appointment for 04/12/14

## 2014-03-17 NOTE — Telephone Encounter (Signed)
Refills sent. Please call pt to arrange follow up before further refills due.

## 2014-03-17 NOTE — Telephone Encounter (Signed)
OK to send 1 month supply no additional refills. Please notify pt that he will need follow up prior to additional refills.

## 2014-03-17 NOTE — Telephone Encounter (Signed)
Do you want these rx's refilled? Please advise

## 2014-04-12 ENCOUNTER — Ambulatory Visit: Payer: BC Managed Care – PPO | Admitting: Family

## 2014-04-14 ENCOUNTER — Ambulatory Visit: Payer: BC Managed Care – PPO | Admitting: Family

## 2014-04-14 ENCOUNTER — Telehealth: Payer: Self-pay | Admitting: Family

## 2014-04-14 NOTE — Telephone Encounter (Signed)
Pt call pt and schedule f/u that he no showed today.

## 2014-04-14 NOTE — Telephone Encounter (Signed)
No show med refill

## 2014-04-14 NOTE — Telephone Encounter (Signed)
Noted.  Will need to be seen before additional refills can be provided.

## 2014-04-14 NOTE — Telephone Encounter (Signed)
Appointment rescheduled for 04/23/14

## 2014-04-23 ENCOUNTER — Encounter: Payer: Self-pay | Admitting: Family

## 2014-04-23 ENCOUNTER — Ambulatory Visit (INDEPENDENT_AMBULATORY_CARE_PROVIDER_SITE_OTHER): Payer: BC Managed Care – PPO | Admitting: Family

## 2014-04-23 VITALS — BP 120/86 | HR 83 | Temp 98.6°F | Resp 16 | Ht 74.0 in | Wt 231.8 lb

## 2014-04-23 DIAGNOSIS — Z0000 Encounter for general adult medical examination without abnormal findings: Secondary | ICD-10-CM

## 2014-04-23 DIAGNOSIS — R7989 Other specified abnormal findings of blood chemistry: Secondary | ICD-10-CM

## 2014-04-23 DIAGNOSIS — K3184 Gastroparesis: Secondary | ICD-10-CM

## 2014-04-23 DIAGNOSIS — R945 Abnormal results of liver function studies: Secondary | ICD-10-CM

## 2014-04-23 DIAGNOSIS — Z23 Encounter for immunization: Secondary | ICD-10-CM

## 2014-04-23 DIAGNOSIS — K219 Gastro-esophageal reflux disease without esophagitis: Secondary | ICD-10-CM

## 2014-04-23 MED ORDER — PANTOPRAZOLE SODIUM 40 MG PO TBEC: DELAYED_RELEASE_TABLET | ORAL | Status: DC

## 2014-04-23 NOTE — Progress Notes (Signed)
   Subjective:    Patient ID: Gregory Fowler, male    DOB: 01-06-1976, 38 y.o.   MRN: [id number]  HPI  Gregory Fowler is a 38 yr old male who presents today for follow up.  1) GERD- pt is maintained on PPI.  (protonix).  Reports that he still has some acid reflux.    2) Gastroparesis- he is maintained on reglan. He is only taking once a day.  He saw Dr. Jarold Motto 2012- was upset about the cost that he incurred.    3) Abnormal LFT's- hx of fatty liver.  Has not been compliant with low fat/low cholesterol diet. Reports that he enjoys grilling out. Eats a lot of red meat.  4) Tobacco abuse- still smoking a pack a day.  Not motivated to quit.   Review of Systems See HPI  Past Medical History  Diagnosis Date  . Nonspecific elevation of levels of transaminase or lactic acid dehydrogenase (LDH)   . Abdominal pain, other specified site   . Backache, unspecified   . Fatty liver     History   Social History  . Marital Status: Married    Spouse Name: N/A    Number of Children: 1  . Years of Education: N/A   Occupational History  .     Social History Main Topics  . Smoking status: Current Every Day Smoker -- 1.00 packs/day    Types: Cigarettes  . Smokeless tobacco: Never Used  . Alcohol Use: Yes     Comment: occasional  . Drug Use: No  . Sexual Activity: Not on file   Other Topics Concern  . Not on file   Social History Narrative  . No narrative on file    Past Surgical History  Procedure Laterality Date  . Tonsillectomy      Family History  Problem Relation Age of Onset  . Heart disease Paternal Aunt   . Heart disease Paternal Grandmother   . Liver disease Mother     No Known Allergies  Current Outpatient Prescriptions on File Prior to Visit  Medication Sig Dispense Refill  . metoCLOPramide (REGLAN) 10 MG tablet TAKE 1 TABLET (10 MG TOTAL) BY MOUTH 2 (TWO) TIMES DAILY.  **NEED OV**  60 tablet  0   No current facility-administered medications on file  prior to visit.    BP 120/86  Pulse 83  Temp(Src) 98.6 F (37 C) (Oral)  Resp 16  Ht  (1.88 m)  Wt 231 lb 12.8 oz (105.144 kg)  BMI 29.75 kg/m2  SpO2 98%       Objective:   Physical Exam  Constitutional: He is oriented to person, place, and time. He appears well-developed and well-nourished. No distress.  HENT:  Head: Normocephalic and atraumatic.  Cardiovascular: Normal rate and regular rhythm.   No murmur heard. Pulmonary/Chest: Effort normal and breath sounds normal. No respiratory distress. He has no wheezes. He has no rales. He exhibits no tenderness.  Musculoskeletal: He exhibits no edema.  Neurological: He is alert and oriented to person, place, and time.  Psychiatric: His behavior is normal. Judgment and thought content normal. His affect is blunt.  Argumentative,           Assessment & Plan:  He wishes to return fasting for LFT and other physical labs. He is instructed to schedule a complete physical

## 2014-04-23 NOTE — Assessment & Plan Note (Signed)
Only taking reglan once a day. Advised that he try to take at least bid.

## 2014-04-23 NOTE — Patient Instructions (Signed)
Please schedule a fasting lab appointment. Schedule a complete physical. Work on quitting smoking and on low fat/low cholesterol diet.

## 2014-04-23 NOTE — Assessment & Plan Note (Signed)
Repeat lft, advised pt on healthy diet and avoiding red meat.

## 2014-04-23 NOTE — Progress Notes (Signed)
Pre visit review using our clinic review tool, if applicable. No additional management support is needed unless otherwise documented below in the visit note. 

## 2014-04-23 NOTE — Assessment & Plan Note (Signed)
Fair control with PPI, perhaps if he get get his gastroparesis under better control with bid or tid reglan dosing his reflux symptoms will also improve.

## 2014-05-18 ENCOUNTER — Encounter: Payer: BC Managed Care – PPO | Admitting: Family

## 2014-06-10 ENCOUNTER — Other Ambulatory Visit: Payer: Self-pay | Admitting: Family

## 2014-06-21 ENCOUNTER — Encounter: Payer: BC Managed Care – PPO | Admitting: Family

## 2014-09-08 ENCOUNTER — Ambulatory Visit (INDEPENDENT_AMBULATORY_CARE_PROVIDER_SITE_OTHER): Payer: Managed Care, Other (non HMO) | Admitting: Family

## 2014-09-08 ENCOUNTER — Encounter: Payer: Self-pay | Admitting: Internal Medicine

## 2014-09-08 ENCOUNTER — Encounter: Payer: Self-pay | Admitting: Family

## 2014-09-08 VITALS — BP 136/81 | HR 90 | Temp 97.9°F | Resp 16 | Ht 74.0 in | Wt 232.1 lb

## 2014-09-08 DIAGNOSIS — K219 Gastro-esophageal reflux disease without esophagitis: Secondary | ICD-10-CM

## 2014-09-08 DIAGNOSIS — R790 Abnormal level of blood mineral: Secondary | ICD-10-CM

## 2014-09-08 DIAGNOSIS — Z23 Encounter for immunization: Secondary | ICD-10-CM

## 2014-09-08 DIAGNOSIS — K3184 Gastroparesis: Secondary | ICD-10-CM

## 2014-09-08 DIAGNOSIS — Z0000 Encounter for general adult medical examination without abnormal findings: Secondary | ICD-10-CM | POA: Insufficient documentation

## 2014-09-08 HISTORY — DX: Encounter for general adult medical examination without abnormal findings: Z00.00

## 2014-09-08 LAB — CBC WITH DIFFERENTIAL/PLATELET
Basophils Absolute: 0.1 10*3/uL (ref 0.0–0.1)
Basophils Relative: 0.9 % (ref 0.0–3.0)
EOS ABS: 0.2 10*3/uL (ref 0.0–0.7)
Eosinophils Relative: 3.4 % (ref 0.0–5.0)
HCT: 45.8 % (ref 39.0–52.0)
Hemoglobin: 16.1 g/dL (ref 13.0–17.0)
Lymphocytes Relative: 29.6 % (ref 12.0–46.0)
Lymphs Abs: 2.2 10*3/uL (ref 0.7–4.0)
MCHC: 35.3 g/dL (ref 30.0–36.0)
MCV: 83.2 fl (ref 78.0–100.0)
MONO ABS: 0.6 10*3/uL (ref 0.1–1.0)
MONOS PCT: 7.6 % (ref 3.0–12.0)
Neutro Abs: 4.3 10*3/uL (ref 1.4–7.7)
Neutrophils Relative %: 58.5 % (ref 43.0–77.0)
Platelets: 224 10*3/uL (ref 150.0–400.0)
RBC: 5.51 Mil/uL (ref 4.22–5.81)
RDW: 13.2 % (ref 11.5–15.5)
WBC: 7.3 10*3/uL (ref 4.0–10.5)

## 2014-09-08 LAB — URINALYSIS, ROUTINE W REFLEX MICROSCOPIC
Bilirubin Urine: NEGATIVE
Ketones, ur: NEGATIVE
Leukocytes, UA: NEGATIVE
NITRITE: NEGATIVE
Specific Gravity, Urine: 1.025 (ref 1.000–1.030)
TOTAL PROTEIN, URINE-UPE24: NEGATIVE
UROBILINOGEN UA: 0.2 (ref 0.0–1.0)
Urine Glucose: NEGATIVE
pH: 6 (ref 5.0–8.0)

## 2014-09-08 LAB — LIPID PANEL
CHOL/HDL RATIO: 7
Cholesterol: 231 mg/dL — ABNORMAL HIGH (ref 0–200)
HDL: 31.8 mg/dL — ABNORMAL LOW (ref >39.00)
Triglycerides: 460 mg/dL — ABNORMAL HIGH (ref 0.0–149.0)

## 2014-09-08 LAB — BASIC METABOLIC PANEL
BUN: 21 mg/dL (ref 6–23)
CO2: 27 mEq/L (ref 19–32)
Calcium: 9.9 mg/dL (ref 8.4–10.5)
Chloride: 105 mEq/L (ref 96–112)
Creatinine, Ser: 1.14 mg/dL (ref 0.40–1.50)
GFR: 76.16 mL/min (ref >60.00)
GLUCOSE: 101 mg/dL — AB (ref 70–99)
Potassium: 4.3 mEq/L (ref 3.5–5.1)
SODIUM: 138 meq/L (ref 135–145)

## 2014-09-08 LAB — HEPATIC FUNCTION PANEL
ALBUMIN: 4.5 g/dL (ref 3.5–5.2)
ALT: 75 U/L — ABNORMAL HIGH (ref 0–53)
AST: 42 U/L — AB (ref 0–37)
Alkaline Phosphatase: 79 U/L (ref 39–117)
BILIRUBIN DIRECT: 0.1 mg/dL (ref 0.0–0.3)
BILIRUBIN TOTAL: 0.5 mg/dL (ref 0.2–1.2)
TOTAL PROTEIN: 7.3 g/dL (ref 6.0–8.3)

## 2014-09-08 LAB — TSH: TSH: 1.27 u[IU]/mL (ref 0.35–4.50)

## 2014-09-08 LAB — LDL CHOLESTEROL, DIRECT: LDL DIRECT: 162 mg/dL

## 2014-09-08 NOTE — Assessment & Plan Note (Signed)
Discussed healthy diet, exercise, weight loss. Tdap today.

## 2014-09-08 NOTE — Assessment & Plan Note (Signed)
Continue protonix, a referral has been made to GI at pt request.

## 2014-09-08 NOTE — Assessment & Plan Note (Signed)
Management per GI.   

## 2014-09-08 NOTE — Progress Notes (Signed)
Subjective:    Patient ID: Gregory Fowler, male    DOB: 1976-08-12, 39 y.o.   MRN: [id number]  HPI  Gregory Fowler is a 39 yr old male who presents today for complete physical.   Patient presents today for complete physical.  Immunizations: unsure of last last visit.  Flu shot up to date Diet: not eating healthy Exercise: plans to go to the gym with his brother in law  He reports that he continue to have belching despite use of protonix, would like to return to GI.    Review of Systems Past Medical History  Diagnosis Date  . Nonspecific elevation of levels of transaminase or lactic acid dehydrogenase (LDH)   . Abdominal pain, other specified site   . Backache, unspecified   . Fatty liver     History   Social History  . Marital Status: Married    Spouse Name: N/A    Number of Children: 1  . Years of Education: N/A   Occupational History  .     Social History Main Topics  . Smoking status: Current Every Day Smoker -- 1.00 packs/day    Types: Cigarettes  . Smokeless tobacco: Never Used  . Alcohol Use: Yes     Comment: occasional  . Drug Use: No  . Sexual Activity: Not on file   Other Topics Concern  . Not on file   Social History Narrative    Past Surgical History  Procedure Laterality Date  . Tonsillectomy      Family History  Problem Relation Age of Onset  . Heart disease Paternal Aunt   . Heart disease Paternal Grandmother   . Liver disease Mother     No Known Allergies  Current Outpatient Prescriptions on File Prior to Visit  Medication Sig Dispense Refill  . metoCLOPramide (REGLAN) 10 MG tablet TAKE 1 TABLET (10 MG TOTAL) BY MOUTH 2 (TWO) TIMES DAILY. 60 tablet 2  . pantoprazole (PROTONIX) 40 MG tablet TAKE 1 TABLET BY MOUTH DAILY 30 tablet 5   No current facility-administered medications on file prior to visit.    BP 136/81 mmHg  Pulse 90  Temp(Src) 97.9 F (36.6 C) (Oral)  Resp 16  Ht  (1.88 m)  Wt 232 lb 2 oz (105.291 kg)   BMI 29.79 kg/m2  SpO2 97%       Objective:   Physical Exam  Physical Exam  Constitutional: He is oriented to person, place, and time. He appears well-developed and well-nourished. No distress.  HENT:  Head: Normocephalic and atraumatic.  Right Ear: Tympanic membrane and ear canal normal.  Left Ear: Tympanic membrane and ear canal normal.  Mouth/Throat: Oropharynx is clear and moist.  Eyes: Pupils are equal, round, and reactive to light. No scleral icterus.  Neck: Normal range of motion. No thyromegaly present.  Cardiovascular: Normal rate and regular rhythm.   No murmur heard. Pulmonary/Chest: Effort normal and breath sounds normal. No respiratory distress. He has no wheezes. He has no rales. He exhibits no tenderness.  Abdominal: Soft. Bowel sounds are normal. He exhibits no distension and no mass. There is no tenderness. There is no rebound and no guarding.  Musculoskeletal: He exhibits no edema.  Lymphadenopathy:    He has no cervical adenopathy.  Neurological: He is alert and oriented to person, place, and time. He has normal patellar reflexes. He exhibits normal muscle tone. Coordination normal.  Skin: Skin is warm and dry.  Psychiatric: He has a normal mood and  affect. His behavior is normal. Judgment and thought content normal.          Assessment & Plan:         Assessment & Plan:

## 2014-09-08 NOTE — Progress Notes (Signed)
Pre visit review using our clinic review tool, if applicable. No additional management support is needed unless otherwise documented below in the visit note/SLS  

## 2014-09-08 NOTE — Patient Instructions (Signed)
Please complete lab work prior to leaving. You will be contacted about your referral to GI. Work on [employer] Corporation[employer] diet, exercise, weight loss.  Follow up in 1 year, sooner if problems/ concerns.

## 2014-09-09 ENCOUNTER — Telehealth: Payer: Self-pay | Admitting: Family

## 2014-09-09 NOTE — Telephone Encounter (Signed)
emmi emailed °

## 2014-09-10 ENCOUNTER — Other Ambulatory Visit (INDEPENDENT_AMBULATORY_CARE_PROVIDER_SITE_OTHER): Payer: Managed Care, Other (non HMO)

## 2014-09-10 DIAGNOSIS — R7309 Other abnormal glucose: Secondary | ICD-10-CM

## 2014-09-10 LAB — HEMOGLOBIN A1C: Hgb A1c MFr Bld: 5.6 % (ref 4.6–6.5)

## 2014-09-12 ENCOUNTER — Encounter: Payer: Self-pay | Admitting: Family

## 2014-10-14 ENCOUNTER — Ambulatory Visit: Payer: Managed Care, Other (non HMO) | Admitting: Internal Medicine

## 2014-11-17 ENCOUNTER — Ambulatory Visit (INDEPENDENT_AMBULATORY_CARE_PROVIDER_SITE_OTHER): Payer: Managed Care, Other (non HMO) | Admitting: Internal Medicine

## 2014-11-17 ENCOUNTER — Encounter: Payer: Self-pay | Admitting: Internal Medicine

## 2014-11-17 VITALS — BP 126/70 | HR 68 | Ht 74.0 in | Wt 228.4 lb

## 2014-11-17 DIAGNOSIS — K219 Gastro-esophageal reflux disease without esophagitis: Secondary | ICD-10-CM | POA: Diagnosis not present

## 2014-11-17 DIAGNOSIS — R932 Abnormal findings on diagnostic imaging of liver and biliary tract: Secondary | ICD-10-CM | POA: Diagnosis not present

## 2014-11-17 DIAGNOSIS — K760 Fatty (change of) liver, not elsewhere classified: Secondary | ICD-10-CM

## 2014-11-17 DIAGNOSIS — R142 Eructation: Secondary | ICD-10-CM | POA: Diagnosis not present

## 2014-11-17 NOTE — Patient Instructions (Signed)
Per Dr. Marina GoodellPerry, discontinue Reglan  Continue exercise and weight loss.  Please follow up with Dr. Marina GoodellPerry in 2 years

## 2014-11-17 NOTE — Progress Notes (Signed)
HISTORY OF PRESENT ILLNESS:  Gregory Fowler is a 39 y.o. male referred to me by [name], NP for evaluation of GERD and fatty liver. Outside records, previous office and endoscopy reports reviewed. As well laboratories reviewed. The patient was evaluated by Dr. Sheryn Bison in September 2012 for intractable belching. Upper endoscopy 05/11/2011 revealed mild esophagitis and gastritis. The patient was said to have "increased liquid fluid" which raised the question of gastroparesis. It was advised that he continue PPI and continue Reglan, but subsequently initiate domperidone. Due to logistical reasons, he continued on Reglan and pantoprazole. He has been on pantoprazole 40 mg daily and Reglan 10 mg daily. Never formally proven to have gastroparesis. Abdominal ultrasound in August 2012 was remarkable for fatty liver. There is no family history of liver disease. Review of outside laboratories from January 2016 reveal mild elevation of liver tests with AST 42, ALT 75, alkaline phosphatase 79, and total bilirubin 0.5. Elevated lipids. Normal CBC with differential and TSH. Since January he has increased his activity level. He is lost a few pounds. He has many pounds overweight from his baseline. Current complaints include occasional belching with burning. Symptoms seem to be worse at night after going to sleep or early in the morning. Occasionally after spicy meals. He reports symptoms 2-3 times per week. No dysphagia. GI review of systems otherwise negative  REVIEW OF SYSTEMS:  All non-GI ROS negative except for back pain  Past Medical History  Diagnosis Date  . Nonspecific elevation of levels of transaminase or lactic acid dehydrogenase (LDH)   . Abdominal pain, other specified site   . Backache, unspecified   . Fatty liver   . Gastroparesis   . GERD (gastroesophageal reflux disease)   . Hiatal hernia     Past Surgical History  Procedure Laterality Date  . Tonsillectomy    . Lasix  eye surgery Bilateral     Social History Malic Rosten  reports that he has been smoking Cigarettes.  He has a 30 pack-year smoking history. He has quit using smokeless tobacco. His smokeless tobacco use included Chew. He reports that he drinks alcohol. He reports that he does not use illicit drugs.  family history includes Heart disease in his paternal aunt and paternal grandmother; Liver disease in his mother. There is no history of Colon cancer, Colon polyps, Diabetes, Kidney disease, Gallbladder disease, or Esophageal cancer.  No Known Allergies     PHYSICAL EXAMINATION: Vital signs: BP 126/70 mmHg  Pulse 68  Ht  (1.88 m)  Wt 228 lb 6 oz (103.59 kg)  BMI 29.31 kg/m2 General: Well-developed, obese, well-nourished, no acute distress HEENT: Sclerae are anicteric, conjunctiva pink. Oral mucosa intact Lungs: Clear Heart: Regular Abdomen: soft, obese, nontender, nondistended, no obvious ascites, no peritoneal signs, normal bowel sounds. No organomegaly. Extremities: No edema Psychiatric: alert and oriented x3. Cooperative   ASSESSMENT:  #1. GERD. The patient is currently experiencing occasional breakthrough symptoms despite current medical regimen #2. Previous question of gastroparesis. On low-dose Reglan chronically. We discussed that it was highly unlikely that this medication was helpful. We also reviewed long-term potential medication side effects, which can be serious #3. Increased intestinal gas/belching #4. Obesity #5. Fatty liver on imaging with associated mild elevation of liver tests. I reviewed with him the potential for progression to cirrhosis of left unattended to  PLAN:  #1. Reflux precautions. Stressed the importance of weight loss #2. Continue pantoprazole 40 mg daily. Advised to take 30 minutes before breakfast.  Also, for breakthrough symptoms was advised to use an antacid such as Maalox or Tums #3. Reassurance provider regarding concerns over  belching #4. Advised to discontinue metoclopramide #5. For fatty liver, in addition to weight loss, regular exercise  #6. Routine GI follow-up 2 years  60 minutes spent evaluating and counseling the patient on his multiple GI issues  A copy of this consultation note has been sent to Sandford CrazeMelissa O'Sullivan NP

## 2014-12-25 ENCOUNTER — Other Ambulatory Visit: Payer: Self-pay | Admitting: Family

## 2014-12-26 ENCOUNTER — Other Ambulatory Visit: Payer: Self-pay | Admitting: Family

## 2014-12-27 ENCOUNTER — Other Ambulatory Visit: Payer: Self-pay | Admitting: Family

## 2015-03-07 ENCOUNTER — Ambulatory Visit (INDEPENDENT_AMBULATORY_CARE_PROVIDER_SITE_OTHER): Payer: Managed Care, Other (non HMO) | Admitting: Family

## 2015-03-07 ENCOUNTER — Encounter: Payer: Self-pay | Admitting: Family

## 2015-03-07 VITALS — BP 120/88 | HR 71 | Temp 97.7°F | Resp 16 | Ht 74.0 in | Wt 233.0 lb

## 2015-03-07 DIAGNOSIS — F17200 Nicotine dependence, unspecified, uncomplicated: Secondary | ICD-10-CM

## 2015-03-07 DIAGNOSIS — Z720 Tobacco use: Secondary | ICD-10-CM

## 2015-03-07 MED ORDER — VARENICLINE TARTRATE 0.5 MG X 11 & 1 MG X 42 PO MISC: ORAL | Status: DC

## 2015-03-07 NOTE — Progress Notes (Signed)
Pre visit review using our clinic review tool, if applicable. No additional management support is needed unless otherwise documented below in the visit note. 

## 2015-03-07 NOTE — Patient Instructions (Addendum)
Start chantix.  You can smoke the first week, then stop smoking. Call me before you run out and let me know how you are doing- we will send second month supply.  Follow up in 3 months, sooner if problems/concerns.

## 2015-03-07 NOTE — Progress Notes (Signed)
   Subjective:    Patient ID: Ayesha Mohair, male    DOB: [birth year], 39 y.o.   MRN: [id number]  HPI  Mr. Haste is a 39 yr old male who present today to discuss smoking cessation.  Smokes 30 cig a day.  Quit x 2 yrs about 10 yrs ago. Used the nicotine patch at that time to quit.  Started smoking at age 46.    Review of Systems     Objective:   Physical Exam  Constitutional: He appears well-developed and well-nourished. No distress.  Psychiatric: He has a normal mood and affect. His behavior is normal. Judgment and thought content normal.          Assessment & Plan:

## 2015-03-07 NOTE — Assessment & Plan Note (Signed)
Discussed various options for quitting smoking including zyban, patches and chantix.  Pt desires trial of chantix. Common side effects including rare risk of suicide ideation was discussed with the patient today. We discussed that patient can continue to smoke for 1 week after starting chantix, but then must discontinue cigarettes.  He is also instructed to contact us prior to completion of the starter month pack for an rx for the continuation month pack. 15 min spent with pt today.  All of this time was spent counseling pt on smoking cessation.

## 2015-03-18 ENCOUNTER — Telehealth: Payer: Self-pay | Admitting: Family

## 2015-03-18 NOTE — Telephone Encounter (Signed)
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

## 2015-03-18 NOTE — Telephone Encounter (Signed)
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 He said it's different from a regular physical. Do we do those and do we have the forms? If we do them and don't have the forms can we tell him where to get them?

## 2015-04-04 ENCOUNTER — Other Ambulatory Visit: Payer: Self-pay | Admitting: Family

## 2015-04-04 MED ORDER — VARENICLINE TARTRATE 1 MG PO TABS: 1.0000 mg | ORAL_TABLET | Freq: Two times a day (BID) | ORAL | Status: DC

## 2015-04-04 NOTE — Telephone Encounter (Signed)
Rx sent to pharmacy   

## 2015-04-04 NOTE — Telephone Encounter (Signed)
°  Relation to WU:JWJX Call back number:[phone] Pharmacy:[facility]  Reason for call: pt is needing another 30 day rx for varenicline (CHANTIX STARTING MONTH PAK) 0.5 MG X 11 & 1 MG X 42 tablet .

## 2015-04-04 NOTE — Addendum Note (Signed)
Addended by: Tylene Fantasia on: 04/04/2015 04:42 PM   Modules accepted: Orders

## 2015-04-04 NOTE — Telephone Encounter (Signed)
Ok to send continuation pack x1.  Will defer refills to [facility]

## 2015-04-04 NOTE — Telephone Encounter (Signed)
Pt received starter pack on 03/07/15.  Please advise.

## 2015-04-19 ENCOUNTER — Ambulatory Visit (INDEPENDENT_AMBULATORY_CARE_PROVIDER_SITE_OTHER): Payer: Self-pay | Admitting: Family Medicine

## 2015-04-19 VITALS — BP 120/78 | HR 81 | Temp 98.1°F | Resp 18 | Ht 73.0 in | Wt 235.0 lb

## 2015-04-19 DIAGNOSIS — Z0000 Encounter for general adult medical examination without abnormal findings: Secondary | ICD-10-CM

## 2015-04-19 DIAGNOSIS — Z021 Encounter for pre-employment examination: Secondary | ICD-10-CM

## 2015-04-19 NOTE — Progress Notes (Signed)
[facility] [address], [place] [address] [phone] [phone]  Date:  04/19/2015   Name:  Gregory Fowler   DOB:  1975-09-26   MRN:  [id number]  PCP:  Lemont Fillers., NP    Chief Complaint: Employment Physical   History of Present Illness:  Gregory Fowler is a 39 y.o. very pleasant male patient who presents with the following:  Here today for a DOT exam.  He has a history of non- alcoholic fatty liver but is OW healthy   Patient Active Problem List   Diagnosis Date Noted  . Preventative health care 09/08/2014  . Situational anxiety 03/31/2013  . Gastroparesis 10/11/2012  . GERD (gastroesophageal reflux disease) 05/01/2011  . Eructation 05/01/2011  . NAFLD (nonalcoholic fatty liver disease) 29/56/2130  . Back pain 04/08/2011  . Abnormal LFTs 03/03/2011  . TOBACCO USER 07/20/2010    Past Medical History  Diagnosis Date  . Nonspecific elevation of levels of transaminase or lactic acid dehydrogenase (LDH)   . Abdominal pain, other specified site   . Backache, unspecified   . Fatty liver   . Gastroparesis   . GERD (gastroesophageal reflux disease)   . Hiatal hernia     Past Surgical History  Procedure Laterality Date  . Tonsillectomy    . Lasix eye surgery Bilateral     Social History  Substance Use Topics  . Smoking status: Current Every Day Smoker -- 1.00 packs/day for 30 years    Types: Cigarettes  . Smokeless tobacco: Former Neurosurgeon    Types: Chew  . Alcohol Use: 0.0 oz/week    0 Standard drinks or equivalent per week     Comment: occasional    Family History  Problem Relation Age of Onset  . Heart disease Paternal Aunt   . Heart disease Paternal Grandmother   . Liver disease Mother     fatty liver  . Colon cancer Neg Hx   . Colon polyps Neg Hx   . Diabetes Neg Hx   . Kidney disease Neg Hx   . Gallbladder disease Neg Hx   . Esophageal cancer Neg Hx     No Known Allergies  Medication list has been reviewed and  updated.  Current Outpatient Prescriptions on File Prior to Visit  Medication Sig Dispense Refill  . Omega-3 Fatty Acids (FISH OIL PO) Take 1 capsule by mouth daily.    . pantoprazole (PROTONIX) 40 MG tablet TAKE 1 TABLET BY MOUTH DAILY 30 tablet 10  . Probiotic Product (PROBIOTIC DAILY) CAPS Take by mouth daily.    Marland Kitchen Specialty Vitamins Products (ULTIMATE FAT BURNER PO) Take 1 tablet by mouth daily.    . varenicline (CHANTIX CONTINUING MONTH PAK) 1 MG tablet Take 1 tablet (1 mg total) by mouth 2 (two) times daily. 60 tablet 0   No current facility-administered medications on file prior to visit.    Review of Systems:  As per HPI- otherwise negative.   Physical Examination: Filed Vitals:   04/19/15 1402  BP: 120/78  Pulse: 81  Temp: 98.1 F (36.7 C)  Resp: 18   Filed Vitals:   04/19/15 1402  Height:  (1.854 m)  Weight: 235 lb (106.595 kg)   Body mass index is 31.01 kg/(m^2). Ideal Body Weight: Weight in (lb) to have BMI = 25: 189.1  GEN: WDWN, NAD, Non-toxic, A & O x 3, looks well HEENT: Atraumatic, Normocephalic. Neck supple. No masses, No LAD.  Bilateral TM wnl, oropharynx normal.  PEERL,EOMI.   Ears and Nose: No external deformity. CV: RRR, No M/G/R. No JVD. No thrill. No extra heart sounds. PULM: CTA B, no wheezes, crackles, rhonchi. No retractions. No resp. distress. No accessory muscle use. ABD: S, NT, ND EXTR: No c/c/e NEURO Normal gait.  PSYCH: Normally interactive. Conversant. Not depressed or anxious appearing.  Calm demeanor.  Normal strength and DTR all extremities, no inguinal hernia   Assessment and Plan: Physical exam  2 year DOT card.   Signed Abbe Amsterdam, MD

## 2015-07-14 ENCOUNTER — Telehealth: Payer: Self-pay | Admitting: Family

## 2015-07-14 NOTE — Telephone Encounter (Signed)
Called pt to schedule a flu vac. Pt says that he had his shot at his local [facility] on [address] [place] in [place].     Pt also wanted to let his PCP know that he has successfully quit smoking!! He says that he stopped after his last appt in July. I expressed that we are so happy for pt's great news. He says that Chantix helped him.

## 2015-07-15 ENCOUNTER — Encounter: Payer: Self-pay | Admitting: Family

## 2015-07-15 NOTE — Telephone Encounter (Signed)
Health maintenance already up to date. Updated social history.

## 2015-10-17 ENCOUNTER — Ambulatory Visit (INDEPENDENT_AMBULATORY_CARE_PROVIDER_SITE_OTHER): Payer: Managed Care, Other (non HMO) | Admitting: Family

## 2015-10-17 ENCOUNTER — Encounter: Payer: Self-pay | Admitting: Family

## 2015-10-17 VITALS — BP 118/80 | HR 87 | Temp 98.0°F | Resp 18 | Ht 74.0 in | Wt 238.2 lb

## 2015-10-17 DIAGNOSIS — H6692 Otitis media, unspecified, left ear: Secondary | ICD-10-CM | POA: Diagnosis not present

## 2015-10-17 DIAGNOSIS — J069 Acute upper respiratory infection, unspecified: Secondary | ICD-10-CM | POA: Diagnosis not present

## 2015-10-17 MED ORDER — AMOXICILLIN 500 MG PO CAPS: 500.0000 mg | ORAL_CAPSULE | Freq: Three times a day (TID) | ORAL | Status: DC

## 2015-10-17 MED ORDER — OSELTAMIVIR PHOSPHATE 75 MG PO CAPS: 75.0000 mg | ORAL_CAPSULE | Freq: Two times a day (BID) | ORAL | Status: DC

## 2015-10-17 NOTE — Progress Notes (Signed)
Subjective:    Patient ID: Gregory Fowler, male    DOB: 1976/03/01, 40 y.o.   MRN: [id number]  HPI  Gregory Fowler is a 40 yr old male who presents today with chief complaint of runny nose, head/chest congestion and cough. Symptoms have been present x 1 day.  Using dayquil with little improvement in his symptoms. He reports  Mild associated sore throat and body aches.  Denies known flu contacts.     Review of Systems    see HPI  Past Medical History  Diagnosis Date  . Nonspecific elevation of levels of transaminase or lactic acid dehydrogenase (LDH)   . Abdominal pain, other specified site   . Backache, unspecified   . Fatty liver   . Gastroparesis   . GERD (gastroesophageal reflux disease)   . Hiatal hernia     Social History   Social History  . Marital Status: Married    Spouse Name: N/A  . Number of Children: 2  . Years of Education: N/A   Occupational History  . Shop Production designer, theatre/television/filmManager    Social History Main Topics  . Smoking status: Former Smoker -- 1.00 packs/day for 30 years    Types: Cigarettes    Quit date: 03/12/2015  . Smokeless tobacco: Former NeurosurgeonUser    Types: Chew  . Alcohol Use: 0.0 oz/week    0 Standard drinks or equivalent per week     Comment: occasional  . Drug Use: No  . Sexual Activity: Not on file   Other Topics Concern  . 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   Daughter-2013   Son- 2009   Married   Enjoys playing golf   Completed 12th grade       Past Surgical History  Procedure Laterality Date  . Tonsillectomy    . Lasix eye surgery Bilateral     Family History  Problem Relation Age of Onset  . Heart disease Paternal Aunt   . Heart disease Paternal Grandmother   . Liver disease Mother     fatty liver  . Colon cancer Neg Hx   . Colon polyps Neg Hx   . Diabetes Neg Hx   . Kidney disease Neg Hx   . Gallbladder disease Neg Hx   . Esophageal cancer Neg Hx     No  Known Allergies  Current Outpatient Prescriptions on File Prior to Visit  Medication Sig Dispense Refill  . Omega-3 Fatty Acids (FISH OIL PO) Take 1 capsule by mouth daily.    . pantoprazole (PROTONIX) 40 MG tablet TAKE 1 TABLET BY MOUTH DAILY 30 tablet 10  . Probiotic Product (PROBIOTIC DAILY) CAPS Take by mouth daily.    Marland Kitchen. Specialty Vitamins Products (ULTIMATE FAT BURNER PO) Take 1 tablet by mouth daily.     No current facility-administered medications on file prior to visit.    BP 118/80 mmHg  Pulse 87  Temp(Src) 98 F (36.7 C) (Oral)  Resp 18  Ht 6\' 2"  (1.88 m)  Wt 238 lb 3.2 oz (108.047 kg)  BMI 30.57 kg/m2  SpO2 99%    Objective:   Physical Exam  Constitutional: He is oriented to person, place, and time. He appears well-developed and well-nourished. No distress.  HENT:  Head: Normocephalic and atraumatic.  Right Ear: Tympanic membrane and ear canal normal.  Left Ear: Ear canal normal. Tympanic membrane is erythematous.  Neck: Neck supple.  Cardiovascular: Normal rate and regular rhythm.   No murmur heard. Pulmonary/Chest: Effort normal and breath sounds normal. No respiratory distress. He has no wheezes. He has no rales.  Musculoskeletal: He exhibits no edema.  Lymphadenopathy:    He has no cervical adenopathy.  Neurological: He is alert and oriented to person, place, and time.  Skin: Skin is warm and dry.  Psychiatric: He has a normal mood and affect. His behavior is normal. Thought content normal.          Assessment & Plan:  Pt presents with viral URI and early L OM- will rx with amoxicillin.  Advised pt to call if symptoms worsen or if symptoms do not improve.

## 2015-10-17 NOTE — Progress Notes (Signed)
Pre visit review using our clinic review tool, if applicable. No additional management support is needed unless otherwise documented below in the visit note. 

## 2015-10-17 NOTE — Patient Instructions (Signed)
Start amoxicillin for left ear infection. Start tamiflu for possible flu.  Drink plenty of fluids.  Call if symptoms worsen, if you develop fever >101 or if symptoms are not improved in 3 days.

## 2015-10-25 ENCOUNTER — Encounter: Payer: Self-pay | Admitting: Family Medicine

## 2015-10-25 ENCOUNTER — Ambulatory Visit (INDEPENDENT_AMBULATORY_CARE_PROVIDER_SITE_OTHER): Payer: Managed Care, Other (non HMO) | Admitting: Family Medicine

## 2015-10-25 VITALS — BP 110/74 | HR 85 | Temp 98.5°F | Wt 236.2 lb

## 2015-10-25 DIAGNOSIS — K2100 Gastro-esophageal reflux disease with esophagitis, without bleeding: Secondary | ICD-10-CM

## 2015-10-25 DIAGNOSIS — K30 Functional dyspepsia: Secondary | ICD-10-CM

## 2015-10-25 DIAGNOSIS — R109 Unspecified abdominal pain: Secondary | ICD-10-CM

## 2015-10-25 MED ORDER — GI COCKTAIL ~~LOC~~
30.0000 mL | Freq: Once | ORAL | Status: AC
  Administered 2015-10-25: 30 mL via ORAL

## 2015-10-25 MED ORDER — RANITIDINE HCL 150 MG PO TABS: 150.0000 mg | ORAL_TABLET | Freq: Two times a day (BID) | ORAL | Status: DC

## 2015-10-25 NOTE — Patient Instructions (Addendum)
Food Choices for Gastroesophageal Reflux Disease, Adult When you have gastroesophageal reflux disease (GERD), the foods you eat and your eating habits are very important. Choosing the right foods can help ease the discomfort of GERD. WHAT GENERAL GUIDELINES DO I NEED TO FOLLOW?  Choose fruits, vegetables, whole grains, low-fat dairy products, and low-fat meat, fish, and poultry.  Limit fats such as oils, salad dressings, butter, nuts, and avocado.  Keep a food diary to identify foods that cause symptoms.  Avoid foods that cause reflux. These may be different for different people.  Eat frequent small meals instead of three large meals each day.  Eat your meals slowly, in a relaxed setting.  Limit fried foods.  Cook foods using methods other than frying.  Avoid drinking alcohol.  Avoid drinking large amounts of liquids with your meals.  Avoid bending over or lying down until 2-3 hours after eating. WHAT FOODS ARE NOT RECOMMENDED? The following are some foods and drinks that may worsen your symptoms: Vegetables Tomatoes. Tomato juice. Tomato and spaghetti sauce. Chili peppers. Onion and garlic. Horseradish. Fruits Oranges, grapefruit, and lemon (fruit and juice). Meats High-fat meats, fish, and poultry. This includes hot dogs, ribs, ham, sausage, salami, and bacon. Dairy Whole milk and chocolate milk. Sour cream. Cream. Butter. Ice cream. Cream cheese.  Beverages Coffee and tea, with or without caffeine. Carbonated beverages or energy drinks. Condiments Hot sauce. Barbecue sauce.  Sweets/Desserts Chocolate and cocoa. Donuts. Peppermint and spearmint. Fats and Oils High-fat foods, including [place] fries and potato chips. Other Vinegar. Strong spices, such as black pepper, white pepper, red pepper, cayenne, curry powder, cloves, ginger, and chili powder. The items listed above may not be a complete list of foods and beverages to avoid. Contact your dietitian for more  information.   This information is not intended to replace advice given to you by your health care provider. Make sure you discuss any questions you have with your health care provider.   Document Released: 07/30/2005 Document Revised: 08/20/2014 Document Reviewed: 06/03/2013 Elsevier Interactive Patient Education 2016 [employer].   Norovirus Infection A norovirus infection is caused by exposure to a virus in a group of similar viruses (noroviruses). This type of infection causes inflammation in your stomach and intestines (gastroenteritis). Norovirus is the most common cause of gastroenteritis. It also causes food poisoning. Anyone can get a norovirus infection. It spreads very easily (contagious). You can get it from contaminated food, water, surfaces, or other people. Norovirus is found in the stool or vomit of infected people. You can spread the infection as soon as you feel sick until 2 weeks after you recover.  Symptoms usually begin within 2 days after you become infected. Most norovirus symptoms affect the digestive system. CAUSES Norovirus infection is caused by contact with norovirus. You can catch norovirus if you:  Eat or drink something contaminated with norovirus.  Touch surfaces or objects contaminated with norovirus and then put your hand in your mouth.  Have direct contact with an infected person who has symptoms.  Share food, drink, or utensils with someone with who is sick with norovirus. SIGNS AND SYMPTOMS Symptoms of norovirus may include:  Nausea.  Vomiting.  Diarrhea.  Stomach cramps.  Fever.  Chills.  Headache.  Muscle aches.  Tiredness. DIAGNOSIS Your health care provider may suspect norovirus based on your symptoms and physical exam. Your health care provider may also test a sample of your stool or vomit for the virus.  TREATMENT There is no specific treatment  for norovirus. Most people get better without treatment in about 2 days. HOME CARE  INSTRUCTIONS  Replace lost fluids by drinking plenty of water or rehydration fluids containing important minerals called electrolytes. This prevents dehydration. Drink enough fluid to keep your urine clear or pale yellow.  Do not prepare food for others while you are infected. Wait at least 3 days after recovering from the illness to do that. PREVENTION   Wash your hands often, especially after using the toilet or changing a diaper.  Wash fruits and vegetables thoroughly before preparing or serving them.  Throw out any food that a sick person may have touched.  Disinfect contaminated surfaces immediately after someone in the household has been sick. Use a bleach-based household cleaner.  Immediately remove and wash soiled clothes or sheets. SEEK MEDICAL CARE IF:  Your vomiting, diarrhea, and stomach pain is getting worse.  Your symptoms of norovirus do not go away after 2-3 days. SEEK IMMEDIATE MEDICAL CARE IF:  You develop symptoms of dehydration that do not improve with fluid replacement. This may include:  Excessive sleepiness.  Lack of tears.  Dry mouth.  Dizziness when standing.  Weak pulse.   This information is not intended to replace advice given to you by your health care provider. Make sure you discuss any questions you have with your health care provider.   Document Released: 10/20/2002 Document Revised: 08/20/2014 Document Reviewed: 01/07/2014 Elsevier Interactive Patient Education [employer]2016 Elsevier Inc.

## 2015-10-25 NOTE — Progress Notes (Signed)
Pre visit review using our clinic review tool, if applicable. No additional management support is needed unless otherwise documented below in the visit note. 

## 2015-10-26 NOTE — Progress Notes (Signed)
Patient ID: Gregory Fowler, male    DOB: 09-Jan-1976  Age: 40 y.o. MRN: [id number]    Subjective:  Subjective HPI Gregory Fowler presents for abd pain, burping,  NVD,  Since being put on abx.  The vomiting stopped but he has had nonstop diarrhea for last several days.  Both children had GI but over weekend.  No fever.    Review of Systems  Constitutional: Negative for diaphoresis, appetite change, fatigue and unexpected weight change.  Eyes: Negative for pain, redness and visual disturbance.  Respiratory: Negative for cough, chest tightness, shortness of breath and wheezing.   Cardiovascular: Negative for chest pain, palpitations and leg swelling.  Gastrointestinal: Positive for nausea, abdominal pain and diarrhea. Negative for vomiting, constipation, blood in stool and anal bleeding.  Endocrine: Negative for cold intolerance, heat intolerance, polydipsia, polyphagia and polyuria.  Genitourinary: Negative for dysuria, frequency and difficulty urinating.  Neurological: Negative for dizziness, light-headedness, numbness and headaches.    History Past Medical History  Diagnosis Date  . Nonspecific elevation of levels of transaminase or lactic acid dehydrogenase (LDH)   . Abdominal pain, other specified site   . Backache, unspecified   . Fatty liver   . Gastroparesis   . GERD (gastroesophageal reflux disease)   . Hiatal hernia     He has past surgical history that includes Tonsillectomy and lasix eye surgery (Bilateral).   His family history includes Heart disease in his paternal aunt and paternal grandmother; Liver disease in his mother. There is no history of Colon cancer, Colon polyps, Diabetes, Kidney disease, Gallbladder disease, or Esophageal cancer.He reports that he quit smoking about 7 months ago. His smoking use included Cigarettes. He has a 30 pack-year smoking history. He has quit using smokeless tobacco. His smokeless tobacco use included Chew. He reports that he  drinks alcohol. He reports that he does not use illicit drugs.  Current Outpatient Prescriptions on File Prior to Visit  Medication Sig Dispense Refill  . Omega-3 Fatty Acids (FISH OIL PO) Take 1 capsule by mouth daily.    Marland Kitchen oseltamivir (TAMIFLU) 75 MG capsule Take 1 capsule (75 mg total) by mouth 2 (two) times daily. 10 capsule 0  . pantoprazole (PROTONIX) 40 MG tablet TAKE 1 TABLET BY MOUTH DAILY 30 tablet 10  . Probiotic Product (PROBIOTIC DAILY) CAPS Take by mouth daily.    Marland Kitchen Specialty Vitamins Products (ULTIMATE FAT BURNER PO) Take 1 tablet by mouth daily.     No current facility-administered medications on file prior to visit.     Objective:  Objective Physical Exam  Constitutional: He is oriented to person, place, and time. Vital signs are normal. He appears well-developed and well-nourished. He is sleeping.  HENT:  Head: Normocephalic and atraumatic.  Mouth/Throat: Oropharynx is clear and moist.  Eyes: EOM are normal. Pupils are equal, round, and reactive to light.  Neck: Normal range of motion. Neck supple. No thyromegaly present.  Cardiovascular: Normal rate and regular rhythm.   No murmur heard. Pulmonary/Chest: Effort normal and breath sounds normal. No respiratory distress. He has no wheezes. He has no rales. He exhibits no tenderness.  Abdominal: Soft. There is tenderness in the epigastric area.    Musculoskeletal: He exhibits no edema or tenderness.  Neurological: He is alert and oriented to person, place, and time.  Skin: Skin is warm and dry.  Psychiatric: He has a normal mood and affect. His behavior is normal. Judgment and thought content normal.  Nursing note and vitals reviewed. GI  cocktail given with relief  BP 110/74 mmHg  Pulse 85  Temp(Src) 98.5 F (36.9 C) (Oral)  Wt 236 lb 3.2 oz (107.14 kg)  SpO2 98% Wt Readings from Last 3 Encounters:  10/25/15 236 lb 3.2 oz (107.14 kg)  10/17/15 238 lb 3.2 oz (108.047 kg)  04/19/15 235 lb (106.595 kg)      Lab Results  Component Value Date   WBC 7.3 09/08/2014   HGB 16.1 09/08/2014   HCT 45.8 09/08/2014   PLT 224.0 09/08/2014   GLUCOSE 101* 09/08/2014   CHOL 231* 09/08/2014   TRIG * 09/08/2014    460.0 Triglyceride is over 400; calculations on Lipids are invalid.   HDL 31.80* 09/08/2014   LDLDIRECT 162.0 09/08/2014   ALT 75* 09/08/2014   AST 42* 09/08/2014   NA 138 09/08/2014   K 4.3 09/08/2014   CL 105 09/08/2014   CREATININE 1.14 09/08/2014   BUN 21 09/08/2014   CO2 27 09/08/2014   TSH 1.27 09/08/2014   HGBA1C 5.6 09/10/2014    Dg Ankle Complete Left  12/15/2011  *RADIOLOGY REPORT* Clinical Data: 40 year old male status post twisting injury with pain and swelling. LEFT ANKLE COMPLETE - 3+ VIEW Comparison: None. Findings: Anterior and lateral soft tissue swelling at the ankle. Mortise joint alignment preserved.  No definite joint effusion. Talar dome intact.  No acute fracture or dislocation identified. Bone mineralization is within normal limits. IMPRESSION: Soft tissue injury with no acute fracture or dislocation identified about the left ankle. Original Report Authenticated By: Randall An, M.D.    Assessment & Plan:  Plan I have discontinued Mr. Quillin amoxicillin. I am also having him start on ranitidine. Additionally, I am having him maintain his PROBIOTIC DAILY, pantoprazole, Omega-3 Fatty Acids (FISH OIL PO), Specialty Vitamins Products (ULTIMATE FAT BURNER PO), and oseltamivir. We administered gi cocktail.  Meds ordered this encounter  Medications  . gi cocktail (Maalox,Lidocaine,Donnatal)    Sig:   . ranitidine (ZANTAC) 150 MG tablet    Sig: Take 1 tablet (150 mg total) by mouth 2 (two) times daily.    Dispense:  60 tablet    Refill:  2    Problem List Items Addressed This Visit    None    Visit Diagnoses    Gastric pain    -  Primary    Relevant Medications    gi cocktail (Maalox,Lidocaine,Donnatal) (Completed)    ranitidine (ZANTAC) 150 MG  tablet    Other Relevant Orders    Comp Met (CMET)    CBC with Differential/Platelet    H. pylori antibody, IgG    Amylase    Lipase    Gastroesophageal reflux disease with esophagitis        Relevant Medications    ranitidine (ZANTAC) 150 MG tablet    Other Relevant Orders    Comp Met (CMET)    CBC with Differential/Platelet    H. pylori antibody, IgG    Amylase    Lipase       Follow-up: No Follow-up on file.  Garnet Koyanagi, DO

## 2015-11-15 ENCOUNTER — Other Ambulatory Visit: Payer: Self-pay | Admitting: Family

## 2015-11-15 NOTE — Telephone Encounter (Signed)
Medication filled to pharmacy as requested.   

## 2016-01-16 ENCOUNTER — Other Ambulatory Visit: Payer: Self-pay | Admitting: Family

## 2016-01-17 NOTE — Telephone Encounter (Signed)
Medication filled to pharmacy as requested.   

## 2016-03-16 ENCOUNTER — Ambulatory Visit (INDEPENDENT_AMBULATORY_CARE_PROVIDER_SITE_OTHER): Payer: Managed Care, Other (non HMO) | Admitting: Family

## 2016-03-16 ENCOUNTER — Encounter: Payer: Self-pay | Admitting: Family

## 2016-03-16 VITALS — BP 120/74 | HR 65 | Temp 97.7°F | Resp 18 | Ht 74.0 in | Wt 241.0 lb

## 2016-03-16 DIAGNOSIS — M545 Low back pain, unspecified: Secondary | ICD-10-CM

## 2016-03-16 DIAGNOSIS — N451 Epididymitis: Secondary | ICD-10-CM

## 2016-03-16 DIAGNOSIS — R142 Eructation: Secondary | ICD-10-CM | POA: Diagnosis not present

## 2016-03-16 LAB — POCT URINALYSIS DIPSTICK
BILIRUBIN UA: NEGATIVE
Glucose, UA: NEGATIVE
Ketones, UA: NEGATIVE
Leukocytes, UA: NEGATIVE
NITRITE UA: NEGATIVE
PH UA: 6
PROTEIN UA: NEGATIVE
Spec Grav, UA: 1.025
UROBILINOGEN UA: 0.2

## 2016-03-16 MED ORDER — MELOXICAM 7.5 MG PO TABS: 7.5000 mg | ORAL_TABLET | Freq: Every day | ORAL | 0 refills | Status: DC

## 2016-03-16 MED ORDER — LEVOFLOXACIN 500 MG PO TABS: 500.0000 mg | ORAL_TABLET | Freq: Every day | ORAL | 0 refills | Status: DC

## 2016-03-16 NOTE — Progress Notes (Signed)
Subjective:    Patient ID: Gregory Fowler, male    DOB: 28-Aug-1975, 40 y.o.   MRN: [id number]  HPI  Gregory Fowler is a 40 yr old male who presents today with several concerns.  1) Eructation- reports increased "burping when pain got bad." Not currently bothering him.    2) Left low back/hip pain- has been present x 2 weeks.   3) Testicular pain- Reports that he had left testicular pain for "a couple of weeks." denies dysuria, hematuria or new sexual partners.    Review of Systems See HPI  Past Medical History:  Diagnosis Date  . Abdominal pain, other specified site   . Backache, unspecified   . Fatty liver   . Gastroparesis   . GERD (gastroesophageal reflux disease)   . Hiatal hernia   . Nonspecific elevation of levels of transaminase or lactic acid dehydrogenase (LDH)      Social History   Social History  . Marital status: Married    Spouse name: N/A  . Number of children: 2  . Years of education: N/A   Occupational History  . Shop Chemical engineer   Social History Main Topics  . Smoking status: Former Smoker    Packs/day: 1.00    Years: 30.00    Types: Cigarettes    Quit date: 03/12/2015  . Smokeless tobacco: Former Neurosurgeon    Types: Chew  . Alcohol use 0.0 oz/week     Comment: occasional  . Drug use: No  . Sexual activity: Not on file   Other Topics Concern  . 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   Daughter-2013   Son- 2009   Married   Enjoys playing golf   Completed 12th grade       Past Surgical History:  Procedure Laterality Date  . lasix eye surgery Bilateral   . TONSILLECTOMY      Family History  Problem Relation Age of Onset  . Heart disease Paternal Aunt   . Heart disease Paternal Grandmother   . Liver disease Mother     fatty liver  . Colon cancer Neg Hx   . Colon polyps Neg Hx   . Diabetes Neg Hx   . Kidney disease Neg Hx   . Gallbladder  disease Neg Hx   . Esophageal cancer Neg Hx     No Known Allergies  Current Outpatient Prescriptions on File Prior to Visit  Medication Sig Dispense Refill  . Omega-3 Fatty Acids (FISH OIL PO) Take 1 capsule by mouth daily.    . pantoprazole (PROTONIX) 40 MG tablet TAKE 1 TABLET BY MOUTH DAILY 30 tablet 2  . Probiotic Product (PROBIOTIC DAILY) CAPS Take by mouth daily.    . ranitidine (ZANTAC) 150 MG tablet Take 1 tablet (150 mg total) by mouth 2 (two) times daily. 60 tablet 2  . Specialty Vitamins Products (ULTIMATE FAT BURNER PO) Take 1 tablet by mouth daily.     No current facility-administered medications on file prior to visit.     BP 120/74   Pulse 65   Temp 97.7 F (36.5 C) (Oral)   Resp 18   Ht 6\' 2"  (1.88 m)   Wt 241 lb (109.3 kg)   SpO2 98% Comment: room air  BMI 30.94 kg/m       Objective:   Physical Exam  Constitutional: He is oriented to person, place, and  time. He appears well-developed and well-nourished. No distress.  HENT:  Head: Normocephalic and atraumatic.  Cardiovascular: Normal rate and regular rhythm.   No murmur heard. Pulmonary/Chest: Effort normal and breath sounds normal. No respiratory distress. He has no wheezes. He has no rales.  Musculoskeletal: He exhibits no edema.       Cervical back: He exhibits no tenderness.       Thoracic back: He exhibits no tenderness.       Lumbar back: He exhibits no tenderness.  Neurological: He is alert and oriented to person, place, and time.  Skin: Skin is warm and dry.  Psychiatric: He has a normal mood and affect. His behavior is normal. Thought content normal.  GU: no swelling of left testicle.  Prominent epididymis noted left testicle.  No inguinal hernias palpable on exam.   Neg CVAT Chaperone present for GU exam        Assessment & Plan:  Left low back pain-  Likely musculoskeletal pain.  Trial of meloxicam. Pt advised to follow up if new/worseing back pain.  Epididymitis- suspect this is  cause for pt's scrotal pain. Will rx with levaquin once daily.  Pt advised to call if symptoms worsen or do not improve. Will obtain UA and culture as well.   Eructation- improved. Continue PPI

## 2016-03-16 NOTE — Progress Notes (Signed)
Pre visit review using our clinic review tool, if applicable. No additional management support is needed unless otherwise documented below in the visit note. 

## 2016-03-16 NOTE — Addendum Note (Signed)
Addended by: Mervin Kung A on: 03/16/2016 09:02 AM   Modules accepted: Orders

## 2016-03-16 NOTE — Patient Instructions (Signed)
For your left scrotal pain please begin levofloxacin (antibiotic) once daily for 10 days. Call if pain worsens, if you develop fever/swelling or if symptoms do not continue to improve. For back pain- begin meloxicam once daily for next 1-2 weeks. Let us know if your back pain does not improve.

## 2016-03-18 LAB — URINE CULTURE: Organism ID, Bacteria: NO GROWTH

## 2016-03-28 ENCOUNTER — Encounter: Payer: Self-pay | Admitting: *Deleted

## 2016-03-30 ENCOUNTER — Ambulatory Visit (HOSPITAL_BASED_OUTPATIENT_CLINIC_OR_DEPARTMENT_OTHER)
Admission: RE | Admit: 2016-03-30 | Discharge: 2016-03-30 | Disposition: A | Discharge status: Home / Self Care | Payer: Managed Care, Other (non HMO) | Source: Ambulatory Visit | Attending: Medical | Admitting: Medical

## 2016-03-30 ENCOUNTER — Ambulatory Visit (HOSPITAL_BASED_OUTPATIENT_CLINIC_OR_DEPARTMENT_OTHER): Payer: Managed Care, Other (non HMO)

## 2016-03-30 ENCOUNTER — Other Ambulatory Visit (HOSPITAL_BASED_OUTPATIENT_CLINIC_OR_DEPARTMENT_OTHER): Payer: Self-pay | Admitting: Pulmonary Disease

## 2016-03-30 ENCOUNTER — Ambulatory Visit (INDEPENDENT_AMBULATORY_CARE_PROVIDER_SITE_OTHER): Payer: Managed Care, Other (non HMO) | Admitting: Medical

## 2016-03-30 ENCOUNTER — Telehealth: Payer: Self-pay | Admitting: Family

## 2016-03-30 ENCOUNTER — Encounter: Payer: Self-pay | Admitting: Medical

## 2016-03-30 VITALS — BP 117/70 | HR 75 | Temp 98.3°F | Ht 74.0 in | Wt 240.6 lb

## 2016-03-30 DIAGNOSIS — R197 Diarrhea, unspecified: Secondary | ICD-10-CM

## 2016-03-30 DIAGNOSIS — R1032 Left lower quadrant pain: Secondary | ICD-10-CM

## 2016-03-30 DIAGNOSIS — N50812 Left testicular pain: Secondary | ICD-10-CM

## 2016-03-30 DIAGNOSIS — R938 Abnormal findings on diagnostic imaging of other specified body structures: Secondary | ICD-10-CM | POA: Diagnosis not present

## 2016-03-30 LAB — POC URINALSYSI DIPSTICK (AUTOMATED)
BILIRUBIN UA: NEGATIVE
Blood, UA: NEGATIVE
Glucose, UA: NEGATIVE
KETONES UA: NEGATIVE
LEUKOCYTES UA: NEGATIVE
NITRITE UA: NEGATIVE
PH UA: 6
PROTEIN UA: NEGATIVE
Spec Grav, UA: >=1.03
Urobilinogen, UA: 0.2

## 2016-03-30 MED ORDER — DICLOFENAC SODIUM 75 MG PO TBEC: 75.0000 mg | DELAYED_RELEASE_TABLET | Freq: Two times a day (BID) | ORAL | 0 refills | Status: DC

## 2016-03-30 MED ORDER — CIPROFLOXACIN HCL 500 MG PO TABS: 500.0000 mg | ORAL_TABLET | Freq: Two times a day (BID) | ORAL | 0 refills | Status: DC

## 2016-03-30 MED ORDER — METRONIDAZOLE 500 MG PO TABS: 500.0000 mg | ORAL_TABLET | Freq: Three times a day (TID) | ORAL | 0 refills | Status: DC

## 2016-03-30 NOTE — Progress Notes (Signed)
Subjective:    Patient ID: Gregory Fowler, male    DOB: April 04, 1976, 40 y.o.   MRN: [id number]  HPI   Pt in today states that about 2 weeks ago for some groin area pain and some left testicle region pain. Pt states testicle area feels some better. Groin area feels better. Not complete resolution. He states about 3/10 pain level.  Pt has been playing some softball this week. After games pain was worse. Last week playing golf and pain was worse after playing.  Pt want to know kidney function.  Also he is curious about prostate. Mild diarrhea yesterday. 2-3 times(none today). No fh of any colon issues. No fever, no chills or sweats.  Pt willing to do work up to find reason for pain. But also expresses frustration with insurance plan.     Review of Systems  Constitutional: Negative for chills and fatigue.  Respiratory: Negative for cough, chest tightness, shortness of breath and wheezing.   Cardiovascular: Negative for chest pain and palpitations.  Gastrointestinal: Positive for abdominal pain and diarrhea. Negative for abdominal distention, blood in stool, constipation, nausea and vomiting.       Faint loose stool yesterday/mild.  Genitourinary: Negative for difficulty urinating, dysuria and hematuria.  Skin: Negative for rash.  Psychiatric/Behavioral: Negative for behavioral problems and confusion.    Past Medical History:  Diagnosis Date  . Abdominal pain, other specified site   . Backache, unspecified   . Fatty liver   . Gastroparesis   . GERD (gastroesophageal reflux disease)   . Hiatal hernia   . Nonspecific elevation of levels of transaminase or lactic acid dehydrogenase (LDH)      Social History   Social History  . Marital status: Married    Spouse name: N/A  . Number of children: 2  . Years of education: N/A   Occupational History  . Shop Chemical engineerManager [employer]   Social History Main Topics  . Smoking status: Former Smoker    Packs/day: 1.00      Years: 30.00    Types: Cigarettes    Quit date: 03/12/2015  . Smokeless tobacco: Former NeurosurgeonUser    Types: Chew  . Alcohol use 0.0 oz/week     Comment: occasional  . Drug use: No  . Sexual activity: Not on file   Other Topics Concern  . 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   Daughter-2013   Son- 2009   Married   Enjoys playing golf   Completed 12th grade       Past Surgical History:  Procedure Laterality Date  . lasix eye surgery Bilateral   . TONSILLECTOMY      Family History  Problem Relation Age of Onset  . Heart disease Paternal Aunt   . Heart disease Paternal Grandmother   . Liver disease Mother     fatty liver  . Colon cancer Neg Hx   . Colon polyps Neg Hx   . Diabetes Neg Hx   . Kidney disease Neg Hx   . Gallbladder disease Neg Hx   . Esophageal cancer Neg Hx     No Known Allergies  Current Outpatient Prescriptions on File Prior to Visit  Medication Sig Dispense Refill  . Omega-3 Fatty Acids (FISH OIL PO) Take 1 capsule by mouth daily.    . pantoprazole (PROTONIX) 40 MG tablet TAKE 1 TABLET BY MOUTH DAILY 30 tablet  2  . Probiotic Product (PROBIOTIC DAILY) CAPS Take by mouth daily.    Marland Kitchen. Specialty Vitamins Products (ULTIMATE FAT BURNER PO) Take 1 tablet by mouth daily.    . ranitidine (ZANTAC) 150 MG tablet Take 1 tablet (150 mg total) by mouth 2 (two) times daily. (Patient not taking: Reported on 03/30/2016) 60 tablet 2   No current facility-administered medications on file prior to visit.     BP 117/70 (BP Location: Left Arm)   Pulse 75   Temp 98.3 F (36.8 C) (Oral)   Ht 6\' 2"  (1.88 m)   Wt 240 lb 9.6 oz (109.1 kg)   SpO2 98%   BMI 30.89 kg/m       Objective:   Physical Exam  General Appearance- Not in acute distress.  HEENT Eyes- Scleraeral/Conjuntiva-bilat- Not Yellow. Mouth & Throat- Normal.  Chest and Lung Exam Auscultation: Breath sounds:-Normal. Adventitious  sounds:- No Adventitious sounds.  Cardiovascular Auscultation:Rythm - Regular. Heart Sounds -Normal heart sounds.  Abdomen Inspection:-Inspection Normal.  Palpation/Perucssion: Palpation and Percussion of the abdomen reveal- faint left lower quadrant Tender, No Rebound tenderness, No rigidity(Guarding) and No Palpable abdominal masses.  Liver:-Normal.  Spleen:- Normal.   Back- no cva pain.  Genital- pain superior and inferior aspect of testicle. No henria felt in inguinal canal.      Assessment & Plan:  For your testicle pain. Will get stat scrotal US today.  For pain will rx diclofenac.  For possible persisting epididymitis will rx cipro.  Since you have pain in left lower abd will add flagyl to cover possible diverticulitis.  On Monday get cbc, cmp, and psa.(lab closed today)  If symptom worsen over weekend ED available over weekend.  If studies negative and not responding to treatment then would consider ct abd/pelvis next week.  If any diarrha persisting by Monday get stool panel.  Follow up in 5-7 days or as needed  Betania Dizon, Ramon DredgeEdward, VF CorporationPA-C

## 2016-03-30 NOTE — Patient Instructions (Signed)
For your testicle pain. Will get stat scrotal US today.  For pain will rx diclofenac.  For possible persisting epididymitis will rx cipro.  Since you have pain in left lower abd will add flagyl to cover possible diverticulitis.  On Monday get cbc, cmp, and psa.  If symptom worsen over weekend ED available.  If studies negative and not responding to treatment then would consider ct abd/pelvis next week.  If any diarrha persisting by Monday get stool panel.  Follow up in 5-7 days or as needed

## 2016-03-30 NOTE — Telephone Encounter (Signed)
Pt called in because he was seen on 8/4. Pt says that he is now having some diarrhea, and it feels that it feels that he has to urinate frequently but when he go much doesn't come out. Pt would like to know if he would have to come in to be seen or could provider advise further w/out an appt    CB: [phone]

## 2016-03-30 NOTE — Telephone Encounter (Signed)
Has apt at 3:30 today.

## 2016-03-30 NOTE — Progress Notes (Signed)
Pre visit review using our clinic review tool, if applicable. No additional management support is needed unless otherwise documented below in the visit note. 

## 2016-04-02 ENCOUNTER — Encounter (HOSPITAL_BASED_OUTPATIENT_CLINIC_OR_DEPARTMENT_OTHER): Payer: Self-pay | Admitting: Emergency Medicine

## 2016-04-02 ENCOUNTER — Emergency Department (HOSPITAL_BASED_OUTPATIENT_CLINIC_OR_DEPARTMENT_OTHER): Payer: Managed Care, Other (non HMO)

## 2016-04-02 ENCOUNTER — Emergency Department (HOSPITAL_BASED_OUTPATIENT_CLINIC_OR_DEPARTMENT_OTHER)
Admission: EM | Admit: 2016-04-02 | Discharge: 2016-04-02 | Disposition: A | Discharge status: Home / Self Care | Payer: Managed Care, Other (non HMO) | Attending: Emergency Medicine | Admitting: Emergency Medicine

## 2016-04-02 DIAGNOSIS — Z87891 Personal history of nicotine dependence: Secondary | ICD-10-CM | POA: Diagnosis not present

## 2016-04-02 DIAGNOSIS — R109 Unspecified abdominal pain: Secondary | ICD-10-CM

## 2016-04-02 DIAGNOSIS — N132 Hydronephrosis with renal and ureteral calculous obstruction: Secondary | ICD-10-CM | POA: Diagnosis not present

## 2016-04-02 LAB — URINALYSIS, ROUTINE W REFLEX MICROSCOPIC
Bilirubin Urine: NEGATIVE
Glucose, UA: NEGATIVE mg/dL
Hgb urine dipstick: NEGATIVE
Ketones, ur: NEGATIVE mg/dL
LEUKOCYTES UA: NEGATIVE
NITRITE: NEGATIVE
PH: 5.5 (ref 5.0–8.0)
Protein, ur: NEGATIVE mg/dL
SPECIFIC GRAVITY, URINE: 1.02 (ref 1.005–1.030)

## 2016-04-02 LAB — CBC WITH DIFFERENTIAL/PLATELET
BASOS ABS: 0.1 10*3/uL (ref 0.0–0.1)
BASOS PCT: 1 %
Eosinophils Absolute: 0.3 10*3/uL (ref 0.0–0.7)
Eosinophils Relative: 4 %
HEMATOCRIT: 41.7 % (ref 39.0–52.0)
HEMOGLOBIN: 14.8 g/dL (ref 13.0–17.0)
Lymphocytes Relative: 35 %
Lymphs Abs: 2.6 10*3/uL (ref 0.7–4.0)
MCH: 29.2 pg (ref 26.0–34.0)
MCHC: 35.5 g/dL (ref 30.0–36.0)
MCV: 82.2 fL (ref 78.0–100.0)
Monocytes Absolute: 0.8 10*3/uL (ref 0.1–1.0)
Monocytes Relative: 11 %
NEUTROS ABS: 3.8 10*3/uL (ref 1.7–7.7)
NEUTROS PCT: 49 %
Platelets: 210 10*3/uL (ref 150–400)
RBC: 5.07 MIL/uL (ref 4.22–5.81)
RDW: 13.2 % (ref 11.5–15.5)
WBC: 7.5 10*3/uL (ref 4.0–10.5)

## 2016-04-02 LAB — BASIC METABOLIC PANEL
ANION GAP: 8 (ref 5–15)
BUN: 15 mg/dL (ref 6–20)
CALCIUM: 9.2 mg/dL (ref 8.9–10.3)
CO2: 25 mmol/L (ref 22–32)
Chloride: 103 mmol/L (ref 101–111)
Creatinine, Ser: 1.14 mg/dL (ref 0.61–1.24)
Glucose, Bld: 139 mg/dL — ABNORMAL HIGH (ref 65–99)
Potassium: 4 mmol/L (ref 3.5–5.1)
Sodium: 136 mmol/L (ref 135–145)

## 2016-04-02 MED ORDER — IBUPROFEN 600 MG PO TABS: 600.0000 mg | ORAL_TABLET | Freq: Four times a day (QID) | ORAL | 0 refills | Status: DC | PRN

## 2016-04-02 MED ORDER — SODIUM CHLORIDE 0.9 % IV BOLUS (SEPSIS)
1000.0000 mL | Freq: Once | INTRAVENOUS | Status: AC
  Administered 2016-04-02: 1000 mL via INTRAVENOUS

## 2016-04-02 MED ORDER — TAMSULOSIN HCL 0.4 MG PO CAPS: 0.4000 mg | ORAL_CAPSULE | Freq: Every day | ORAL | 0 refills | Status: DC

## 2016-04-02 MED ORDER — HYDROMORPHONE HCL 1 MG/ML IJ SOLN
1.0000 mg | Freq: Once | INTRAMUSCULAR | Status: AC
  Administered 2016-04-02: 1 mg via INTRAVENOUS
  Filled 2016-04-02: qty 1

## 2016-04-02 MED ORDER — TAMSULOSIN HCL 0.4 MG PO CAPS
0.4000 mg | ORAL_CAPSULE | Freq: Once | ORAL | Status: AC
  Administered 2016-04-02: 0.4 mg via ORAL
  Filled 2016-04-02: qty 1

## 2016-04-02 MED ORDER — ONDANSETRON HCL 4 MG/2ML IJ SOLN
4.0000 mg | Freq: Once | INTRAMUSCULAR | Status: AC
  Administered 2016-04-02: 4 mg via INTRAVENOUS
  Filled 2016-04-02: qty 2

## 2016-04-02 MED ORDER — KETOROLAC TROMETHAMINE 30 MG/ML IJ SOLN
30.0000 mg | Freq: Once | INTRAMUSCULAR | Status: AC
  Administered 2016-04-02: 30 mg via INTRAVENOUS
  Filled 2016-04-02: qty 1

## 2016-04-02 MED ORDER — IBUPROFEN 400 MG PO TABS
600.0000 mg | ORAL_TABLET | Freq: Once | ORAL | Status: AC
Start: 2016-04-02 — End: 2016-04-02
  Administered 2016-04-02: 600 mg via ORAL
  Filled 2016-04-02: qty 1

## 2016-04-02 MED FILL — IBUPROFEN 600 MG TABLET: 600 | 8 days supply | Qty: 30 | Fill #0

## 2016-04-02 MED FILL — TAMSULOSIN HCL 0.4 MG CAP: 0.4 | 10 days supply | Qty: 10 | Fill #0

## 2016-04-02 NOTE — ED Notes (Signed)
MD aware that patient is still unable to urinate

## 2016-04-02 NOTE — ED Notes (Signed)
Pt. Still not able to void. Po fluids given. Aware that we will need to cath him per order is not able to void soon. Voiced understanding.

## 2016-04-02 NOTE — ED Triage Notes (Addendum)
C/o groin pain on and off for the past couple weeks and has been seen and treated for same with antibiotics. C/o left flank area that started around 2am. States pain is constant. C/o nausea denies any vomiting. Denies any hx of kidney stones. States he has had some urinary frequency. Pt is not able to sit still due to pain. MD aware and at bedside on arrival.

## 2016-04-02 NOTE — ED Notes (Signed)
Ginger ale given to pt.  

## 2016-04-02 NOTE — ED Provider Notes (Signed)
8:25 AM Patient requesting additional pain medicine. Also complains of headache. He appears alert nontoxic and in no distress He is fine with take Motrin.   8:45 AM patient feels ready to go home. Results for orders placed or performed during the hospital encounter of 04/02/16  Basic metabolic panel  Result Value Ref Range   Sodium 136 135 - 145 mmol/L   Potassium 4.0 3.5 - 5.1 mmol/L   Chloride 103 101 - 111 mmol/L   CO2 25 22 - 32 mmol/L   Glucose, Bld 139 (H) 65 - 99 mg/dL   BUN 15 6 - 20 mg/dL   Creatinine, Ser 6.041.14 0.61 - 1.24 mg/dL   Calcium 9.2 8.9 - 54.010.3 mg/dL   GFR calc non Af Amer >60 >60 mL/min   GFR calc Af Amer >60 >60 mL/min   Anion gap 8 5 - 15  CBC with Differential/Platelet  Result Value Ref Range   WBC 7.5 4.0 - 10.5 K/uL   RBC 5.07 4.22 - 5.81 MIL/uL   Hemoglobin 14.8 13.0 - 17.0 g/dL   HCT 98.141.7 19.139.0 - 47.852.0 %   MCV 82.2 78.0 - 100.0 fL   MCH 29.2 26.0 - 34.0 pg   MCHC 35.5 30.0 - 36.0 g/dL   RDW 29.513.2 62.111.5 - 30.815.5 %   Platelets 210 150 - 400 K/uL   Neutrophils Relative % 49 %   Neutro Abs 3.8 1.7 - 7.7 K/uL   Lymphocytes Relative 35 %   Lymphs Abs 2.6 0.7 - 4.0 K/uL   Monocytes Relative 11 %   Monocytes Absolute 0.8 0.1 - 1.0 K/uL   Eosinophils Relative 4 %   Eosinophils Absolute 0.3 0.0 - 0.7 K/uL   Basophils Relative 1 %   Basophils Absolute 0.1 0.0 - 0.1 K/uL  Urinalysis, Routine w reflex microscopic (not [facility])  Result Value Ref Range   Color, Urine YELLOW YELLOW   APPearance CLEAR CLEAR   Specific Gravity, Urine 1.020 1.005 - 1.030   pH 5.5 5.0 - 8.0   Glucose, UA NEGATIVE NEGATIVE mg/dL   Hgb urine dipstick NEGATIVE NEGATIVE   Bilirubin Urine NEGATIVE NEGATIVE   Ketones, ur NEGATIVE NEGATIVE mg/dL   Protein, ur NEGATIVE NEGATIVE mg/dL   Nitrite NEGATIVE NEGATIVE   Leukocytes, UA NEGATIVE NEGATIVE   [place] Scrotum  Result Date: 03/30/2016 CLINICAL DATA:  Left testicular pain for 3 weeks. Vasectomy 2 years ago. EXAM: SCROTAL ULTRASOUND DOPPLER  ULTRASOUND OF THE TESTICLES TECHNIQUE: Complete ultrasound examination of the testicles, epididymis, and other scrotal structures was performed. Color and spectral Doppler ultrasound were also utilized to evaluate blood flow to the testicles. COMPARISON:  None. FINDINGS: Right testicle Measurements: 5.6 x 2.6 x 4.2 cm.  Normal in morphology. Left testicle Measurements:  5.4 x 2.8 x 3.6 cm.  Normal in morphology. Right epididymis:  5 mm epididymal cyst versus spermatocele. Left epididymis: Epididymal cysts versus spermatoceles. The largest measures 9 mm. Possible increased vascularity about the body of the epididymis, including on image 84. Compare to image 9 on the right. Hydrocele:  Absent Varicocele:  Absent Pulsed Doppler interrogation of both testes demonstrates normal low resistance arterial and venous waveforms bilaterally. IMPRESSION: 1. No evidence of testicular torsion or orchitis. 2. Possible hyperemia involving the body of the left epididymis. Cannot exclude mild epididymitis. Electronically Signed   By: Jeronimo GreavesKyle  Talbot M.D.   On: 03/30/2016 18:49   [place] Art/ven Flow Abd Pelv Doppler  Result Date: 03/30/2016 CLINICAL DATA:  Left testicular pain for  3 weeks. Vasectomy 2 years ago. EXAM: SCROTAL ULTRASOUND DOPPLER ULTRASOUND OF THE TESTICLES TECHNIQUE: Complete ultrasound examination of the testicles, epididymis, and other scrotal structures was performed. Color and spectral Doppler ultrasound were also utilized to evaluate blood flow to the testicles. COMPARISON:  None. FINDINGS: Right testicle Measurements: 5.6 x 2.6 x 4.2 cm.  Normal in morphology. Left testicle Measurements:  5.4 x 2.8 x 3.6 cm.  Normal in morphology. Right epididymis:  5 mm epididymal cyst versus spermatocele. Left epididymis: Epididymal cysts versus spermatoceles. The largest measures 9 mm. Possible increased vascularity about the body of the epididymis, including on image 84. Compare to image 9 on the right. Hydrocele:  Absent  Varicocele:  Absent Pulsed Doppler interrogation of both testes demonstrates normal low resistance arterial and venous waveforms bilaterally. IMPRESSION: 1. No evidence of testicular torsion or orchitis. 2. Possible hyperemia involving the body of the left epididymis. Cannot exclude mild epididymitis. Electronically Signed   By: Jeronimo GreavesKyle  Talbot M.D.   On: 03/30/2016 18:49   Ct Renal Stone Study  Result Date: 04/02/2016 CLINICAL DATA:  Left flank pain, onset 4 hours prior.  Nausea. EXAM: CT ABDOMEN AND PELVIS WITHOUT CONTRAST TECHNIQUE: Multidetector CT imaging of the abdomen and pelvis was performed following the standard protocol without IV contrast. COMPARISON:  None. FINDINGS: Lower chest: Dependent densities in both lower lobes, likely atelectasis. No pleural fluid. Liver: Decreased density consistent with steatosis. Focal fatty sparing adjacent to the gallbladder fossa. No evidence of focal lesion allowing for lack contrast. Hepatobiliary: Gallbladder physiologically distended, no calcified stone. No biliary dilatation. Pancreas: No ductal dilatation or inflammation. Spleen: Normal.  Splenules at the hilum. Adrenal glands: No nodule. Kidneys: Left hydroureteronephrosis with a 5 mm stone at the left ureterovesicular junction or just within the urinary bladder. Mild left perinephric edema. No additional nonobstructing stone in either kidney. No right hydronephrosis. Stomach/Bowel: Stomach physiologically distended. There are no dilated or thickened small bowel loops. Small volume of stool throughout the colon without colonic wall thickening. The appendix is normal. Vascular/Lymphatic: No retroperitoneal adenopathy. Abdominal aorta is normal in caliber. Reproductive: Normal sized prostate gland. Bladder: Physiologically distended.  No wall thickening. Other: No free air, free fluid, or intra-abdominal fluid collection. Tiny fat containing umbilical hernia. Musculoskeletal: There are no acute or suspicious osseous  abnormalities. IMPRESSION: 1. Left hydroureteronephrosis with a stone just at or distal to the left ureterovesicular junction. 2. Hepatic steatosis. Electronically Signed   By: Rubye OaksMelanie  Ehinger M.D.   On: 04/02/2016 06:43     Doug SouSam Tiah Heckel, MD 04/02/16 [phone]

## 2016-04-02 NOTE — ED Notes (Signed)
Returned from CT.

## 2016-04-02 NOTE — ED Notes (Signed)
Transported to CT 

## 2016-04-02 NOTE — ED Notes (Signed)
States pain is increasing again. Will update MD

## 2016-04-02 NOTE — ED Provider Notes (Signed)
[facility] Provider Note   CSN: [id number] Arrival date & time: 04/02/16  0429     History   Chief Complaint Chief Complaint  Patient presents with  . Flank Pain    HPI Gregory Fowler is a 40 y.o. male.  The history is provided by the patient.  Flank Pain  This is a new problem. The current episode started 3 to 5 hours ago. The problem occurs constantly. The problem has been rapidly worsening. Associated symptoms include abdominal pain. Pertinent negatives include no chest pain and no shortness of breath. Exacerbated by: movement. Nothing relieves the symptoms.  Patient presents with acute onset of left flank pain about 3 hours ago that is worsening He reports nausea He reports urinary frequency No fever/vomiting No cp/sob  He reports recent evaluation for left groin pain, and has had recent scrotal ultrasound but has never had this type of flank pain    Past Medical History:  Diagnosis Date  . Abdominal pain, other specified site   . Backache, unspecified   . Fatty liver   . Gastroparesis   . GERD (gastroesophageal reflux disease)   . Hiatal hernia   . Nonspecific elevation of levels of transaminase or lactic acid dehydrogenase (LDH)     Patient Active Problem List   Diagnosis Date Noted  . Preventative health care 09/08/2014  . Situational anxiety 03/31/2013  . Gastroparesis 10/11/2012  . GERD (gastroesophageal reflux disease) 05/01/2011  . Eructation 05/01/2011  . NAFLD (nonalcoholic fatty liver disease) 40/98/1191  . Back pain 04/08/2011  . Abnormal LFTs 03/03/2011  . TOBACCO USER 07/20/2010    Past Surgical History:  Procedure Laterality Date  . lasix eye surgery Bilateral   . TONSILLECTOMY         Home Medications    Prior to Admission medications   Medication Sig Start Date End Date Taking? Authorizing Provider  ciprofloxacin (CIPRO) 500 MG tablet Take 1 tablet (500 mg total) by mouth 2 (two) times daily. 03/30/16   Ramon Dredge  Saguier, PA-C  diclofenac (VOLTAREN) 75 MG EC tablet Take 1 tablet (75 mg total) by mouth 2 (two) times daily. 03/30/16   Ramon Dredge Saguier, PA-C  metroNIDAZOLE (FLAGYL) 500 MG tablet Take 1 tablet (500 mg total) by mouth 3 (three) times daily. 03/30/16   Esperanza Richters, PA-C  Omega-3 Fatty Acids (FISH OIL PO) Take 1 capsule by mouth daily.    Historical Provider, MD  pantoprazole (PROTONIX) 40 MG tablet TAKE 1 TABLET BY MOUTH DAILY 01/17/16   [name], NP  Probiotic Product (PROBIOTIC DAILY) CAPS Take by mouth daily.    Historical Provider, MD  ranitidine (ZANTAC) 150 MG tablet Take 1 tablet (150 mg total) by mouth 2 (two) times daily. Patient not taking: Reported on 03/30/2016 10/25/15   Donato Schultz, DO  Specialty Vitamins Products (ULTIMATE FAT BURNER PO) Take 1 tablet by mouth daily.    Historical Provider, MD    Family History Family History  Problem Relation Age of Onset  . Liver disease Mother     fatty liver  . Heart disease Paternal Aunt   . Heart disease Paternal Grandmother   . Colon cancer Neg Hx   . Colon polyps Neg Hx   . Diabetes Neg Hx   . Kidney disease Neg Hx   . Gallbladder disease Neg Hx   . Esophageal cancer Neg Hx     Social History Social History  Substance Use Topics  . Smoking status: Former Smoker  Packs/day: 1.00    Years: 30.00    Types: Cigarettes    Quit date: 03/12/2015  . Smokeless tobacco: Former NeurosurgeonUser    Types: Chew  . Alcohol use 0.0 oz/week     Comment: occasional     Allergies   Review of patient's allergies indicates no known allergies.   Review of Systems Review of Systems  Constitutional: Negative for fever.  Respiratory: Negative for shortness of breath.   Cardiovascular: Negative for chest pain.  Gastrointestinal: Positive for abdominal pain.  Genitourinary: Positive for flank pain.  All other systems reviewed and are negative.    Physical Exam Updated Vital Signs BP (!) 121/104 (BP Location: Right Arm)    Pulse 90   Temp 98.7 F (37.1 C) (Oral)   Ht 6\' 2"  (1.88 m)   Wt 108.9 kg   SpO2 98%   BMI 30.81 kg/m   Physical Exam CONSTITUTIONAL: Well developed/well nourished, uncomfortable appearing HEAD: Normocephalic/atraumatic EYES: EOMI/PERRL ENMT: Mucous membranes moist NECK: supple no meningeal signs SPINE/BACK:entire spine nontender CV: S1/S2 noted, no murmurs/rubs/gallops noted LUNGS: Lungs are clear to auscultation bilaterally, no apparent distress ABDOMEN: soft, nontender, no rebound or guarding, bowel sounds noted throughout abdomen YN:WGNFGU:left cva tenderness, testicles descended bilaterally, mild left testicular tenderness but no overlying erythema/edema, no hernia noted.  Chaperone present for exam NEURO: Pt is awake/alert/appropriate, moves all extremitiesx4.  No facial droop.   EXTREMITIES: pulses normal/equal, full ROM SKIN: warm, color normal PSYCH: anxious  ED Treatments / Results  Labs (all labs ordered are listed, but only abnormal results are displayed) Labs Reviewed  BASIC METABOLIC PANEL - Abnormal; Notable for the following:       Result Value   Glucose, Bld 139 (*)    All other components within normal limits  CBC WITH DIFFERENTIAL/PLATELET  URINALYSIS, ROUTINE W REFLEX MICROSCOPIC (NOT AT [facility])    EKG  EKG Interpretation None       Radiology No results found.  Procedures Procedures (including critical care time)  Medications Ordered in ED Medications  HYDROmorphone (DILAUDID) injection 1 mg (1 mg Intravenous Given 04/02/16 0507)  ondansetron (ZOFRAN) injection 4 mg (4 mg Intravenous Given 04/02/16 0507)  ketorolac (TORADOL) 30 MG/ML injection 30 mg (30 mg Intravenous Given 04/02/16 0537)  HYDROmorphone (DILAUDID) injection 1 mg (1 mg Intravenous Given 04/02/16 0618)  sodium chloride 0.9 % bolus 1,000 mL (0 mLs Intravenous Stopped 04/02/16 0648)  tamsulosin (FLOMAX) capsule 0.4 mg (0.4 mg Oral Given 04/02/16 0734)     Initial Impression / Assessment  and Plan / ED Course  I have reviewed the triage vital signs and the nursing notes.  Pertinent labs results that were available during my care of the patient were reviewed by me and considered in my medical decision making (see chart for details).  Clinical Course    Suspect this represents ureteral stone Labs and medicines ordered He had scrotal US on 8/18 that was negative for torsion I don't feel this represents torsion at this time 7:43 AM CT scan was ordered due to persistent pain despite medications CT shows distal 5mm stone Awaiting urinalysis Once this is resulted pt will be discharged home   Final Clinical Impressions(s) / ED Diagnoses   Final diagnoses:  Ureteral stone with hydronephrosis    New Prescriptions New Prescriptions   IBUPROFEN (ADVIL,MOTRIN) 600 MG TABLET    Take 1 tablet (600 mg total) by mouth every 6 (six) hours as needed.   TAMSULOSIN (FLOMAX) 0.4 MG CAPS CAPSULE  Take 1 capsule (0.4 mg total) by mouth daily after supper.     Zadie Rhineonald Dequavious Harshberger, MD 04/02/16 [phone]

## 2016-04-02 NOTE — ED Notes (Signed)
Patient ambulatory to bathroom in NAD

## 2016-04-07 ENCOUNTER — Telehealth: Payer: Self-pay | Admitting: Medical

## 2016-04-07 NOTE — Telephone Encounter (Signed)
Opened to review. Since he went to ED.

## 2016-04-16 ENCOUNTER — Other Ambulatory Visit: Payer: Self-pay | Admitting: Family

## 2016-04-24 ENCOUNTER — Telehealth: Payer: Self-pay | Admitting: Family

## 2016-04-24 NOTE — Telephone Encounter (Signed)
Pt called in to request a receipt or itemized statement for DOS 03/30/16.  I spoke with billing, they confirmed that they are able to provide. Transferred pt to billing for further assistance. Pt called back in and stated that billing informed him that they are unable to assist with his request.   Please look into further for pt. If possible pt would like to have faxed to him at [phone]    Pt's call back if needed is : [phone]      Thanks.

## 2016-04-24 NOTE — Telephone Encounter (Signed)
Called patient to get more information... He needs an itemized bill for his lab visit and [facility]. Patient was given to customer service number to get the summary from them because we do not have access to their billing information.

## 2016-09-14 ENCOUNTER — Other Ambulatory Visit: Payer: Self-pay

## 2016-10-02 ENCOUNTER — Telehealth: Payer: Self-pay

## 2016-10-02 MED ORDER — PANTOPRAZOLE SODIUM 40 MG PO TBEC: 40.0000 mg | DELAYED_RELEASE_TABLET | Freq: Every day | ORAL | 90 refills | Status: DC

## 2016-10-02 NOTE — Telephone Encounter (Signed)
He is overdue for a cpx at his convenience.

## 2016-10-03 ENCOUNTER — Encounter: Payer: Self-pay | Admitting: *Deleted

## 2016-10-03 NOTE — Telephone Encounter (Signed)
Mailed letter to pt

## 2016-10-24 ENCOUNTER — Encounter: Payer: Self-pay | Admitting: Family

## 2016-10-24 ENCOUNTER — Ambulatory Visit (INDEPENDENT_AMBULATORY_CARE_PROVIDER_SITE_OTHER): Payer: Managed Care, Other (non HMO) | Admitting: Family

## 2016-10-24 VITALS — BP 104/80 | HR 74 | Temp 98.1°F | Resp 16 | Ht 74.0 in | Wt 238.6 lb

## 2016-10-24 DIAGNOSIS — Z0000 Encounter for general adult medical examination without abnormal findings: Secondary | ICD-10-CM | POA: Diagnosis not present

## 2016-10-24 LAB — LIPID PANEL
CHOL/HDL RATIO: 8
CHOLESTEROL: 212 mg/dL — AB (ref 0–200)
HDL: 28 mg/dL — AB (ref >39.00)

## 2016-10-24 LAB — CBC WITH DIFFERENTIAL/PLATELET
BASOS ABS: 0.1 10*3/uL (ref 0.0–0.1)
BASOS PCT: 1.2 % (ref 0.0–3.0)
Eosinophils Absolute: 0.3 10*3/uL (ref 0.0–0.7)
Eosinophils Relative: 4 % (ref 0.0–5.0)
HEMATOCRIT: 42.6 % (ref 39.0–52.0)
Hemoglobin: 14.7 g/dL (ref 13.0–17.0)
LYMPHS ABS: 2.5 10*3/uL (ref 0.7–4.0)
Lymphocytes Relative: 37.2 % (ref 12.0–46.0)
MCHC: 34.6 g/dL (ref 30.0–36.0)
MCV: 83.3 fl (ref 78.0–100.0)
Monocytes Absolute: 0.6 10*3/uL (ref 0.1–1.0)
Monocytes Relative: 9 % (ref 3.0–12.0)
NEUTROS ABS: 3.3 10*3/uL (ref 1.4–7.7)
NEUTROS PCT: 48.6 % (ref 43.0–77.0)
PLATELETS: 212 10*3/uL (ref 150.0–400.0)
RBC: 5.11 Mil/uL (ref 4.22–5.81)
RDW: 13.3 % (ref 11.5–15.5)
WBC: 6.8 10*3/uL (ref 4.0–10.5)

## 2016-10-24 LAB — URINALYSIS, ROUTINE W REFLEX MICROSCOPIC
Bilirubin Urine: NEGATIVE
Hgb urine dipstick: NEGATIVE
KETONES UR: NEGATIVE
Leukocytes, UA: NEGATIVE
Nitrite: NEGATIVE
PH: 7 (ref 5.0–8.0)
RBC / HPF: NONE SEEN (ref 0–?)
Specific Gravity, Urine: <=1.005 — AB (ref 1.000–1.030)
Total Protein, Urine: NEGATIVE
UROBILINOGEN UA: 0.2 (ref 0.0–1.0)
Urine Glucose: NEGATIVE

## 2016-10-24 LAB — HEPATIC FUNCTION PANEL
ALBUMIN: 4.5 g/dL (ref 3.5–5.2)
ALT: 108 U/L — ABNORMAL HIGH (ref 0–53)
AST: 53 U/L — ABNORMAL HIGH (ref 0–37)
Alkaline Phosphatase: 72 U/L (ref 39–117)
Bilirubin, Direct: 0 mg/dL (ref 0.0–0.3)
TOTAL PROTEIN: 7.3 g/dL (ref 6.0–8.3)
Total Bilirubin: 0.3 mg/dL (ref 0.2–1.2)

## 2016-10-24 LAB — BASIC METABOLIC PANEL
BUN: 21 mg/dL (ref 6–23)
CALCIUM: 10 mg/dL (ref 8.4–10.5)
CHLORIDE: 104 meq/L (ref 96–112)
CO2: 27 meq/L (ref 19–32)
Creatinine, Ser: 1.09 mg/dL (ref 0.40–1.50)
GFR: 79.34 mL/min (ref >60.00)
GLUCOSE: 99 mg/dL (ref 70–99)
Potassium: 3.9 mEq/L (ref 3.5–5.1)
SODIUM: 139 meq/L (ref 135–145)

## 2016-10-24 LAB — TSH: TSH: 2.37 u[IU]/mL (ref 0.35–4.50)

## 2016-10-24 LAB — LDL CHOLESTEROL, DIRECT: LDL DIRECT: 132 mg/dL

## 2016-10-24 NOTE — Progress Notes (Signed)
Subjective:    Patient ID: Gregory Fowler, male    DOB: February 20, 1976, 41 y.o.   MRN: [id number]  HPI  Patient presents today for complete physical.  Immunizations:  Tetanus is up to date.  Flu shot up to date.  Diet:  Very little bread, more fish.   Exercise: gym 5 days a week Quit smoking 2 years ago Vision:  Due  Dental: up to date  Wt Readings from Last 3 Encounters:  10/24/16 238 lb 9.6 oz (108.2 kg)  04/02/16 240 lb (108.9 kg)  03/30/16 240 lb 9.6 oz (109.1 kg)     Review of Systems  Constitutional: Negative for unexpected weight change.  HENT: Negative for hearing loss and rhinorrhea.   Eyes: Negative for visual disturbance.  Respiratory: Negative for cough.   Cardiovascular: Negative for leg swelling.  Gastrointestinal: Negative for constipation.       Occasional diarrhea  Genitourinary: Negative for dysuria and frequency.  Musculoskeletal: Negative for arthralgias and myalgias.  Skin: Negative for rash.  Neurological: Negative for headaches.  Hematological: Negative for adenopathy.  Psychiatric/Behavioral:       Denies depression/anxiety   Past Medical History:  Diagnosis Date  . Abdominal pain, other specified site   . Backache, unspecified   . Fatty liver   . Gastroparesis   . GERD (gastroesophageal reflux disease)   . Hiatal hernia   . Kidney stone   . Nonspecific elevation of levels of transaminase or lactic acid dehydrogenase (LDH)      Social History   Social History  . Marital status: Married    Spouse name: N/A  . Number of children: 2  . Years of education: N/A   Occupational History  . Shop Chemical engineer   Social History Main Topics  . Smoking status: Former Smoker    Packs/day: 1.00    Years: 30.00    Types: Cigarettes    Quit date: 03/12/2015  . Smokeless tobacco: Former Neurosurgeon    Types: Chew  . Alcohol use 0.0 oz/week     Comment: occasional  . Drug use: No  . Sexual activity: Not on file   Other  Topics Concern  . 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   Daughter-2013   Son- 2009   Married   Enjoys playing golf   Completed 12th grade       Past Surgical History:  Procedure Laterality Date  . lasix eye surgery Bilateral   . TONSILLECTOMY      Family History  Problem Relation Age of Onset  . Liver disease Mother     fatty liver  . Heart disease Paternal Aunt   . Heart disease Paternal Grandmother   . Colon cancer Neg Hx   . Colon polyps Neg Hx   . Diabetes Neg Hx   . Kidney disease Neg Hx   . Gallbladder disease Neg Hx   . Esophageal cancer Neg Hx     No Known Allergies  Current Outpatient Prescriptions on File Prior to Visit  Medication Sig Dispense Refill  . Omega-3 Fatty Acids (FISH OIL PO) Take 1 capsule by mouth daily.    . pantoprazole (PROTONIX) 40 MG tablet Take 1 tablet (40 mg total) by mouth daily. 90 tablet 90  . Probiotic Product (PROBIOTIC DAILY) CAPS Take by mouth daily.    . ranitidine (ZANTAC) 150 MG tablet Take 1 tablet (150  mg total) by mouth 2 (two) times daily. 60 tablet 2  . Specialty Vitamins Products (ULTIMATE FAT BURNER PO) Take 1 tablet by mouth daily.     No current facility-administered medications on file prior to visit.     BP 104/80 (BP Location: Right Arm, Cuff Size: Large)   Pulse 74   Temp 98.1 F (36.7 C) (Oral)   Resp 16   Ht 6\' 2"  (1.88 m)   Wt 238 lb 9.6 oz (108.2 kg)   SpO2 98% Comment: room air  BMI 30.63 kg/m       Objective:   Physical Exam  Physical Exam  Constitutional: He is oriented to person, place, and time. He appears well-developed and well-nourished. No distress.  HENT:  Head: Normocephalic and atraumatic.  Right Ear: Tympanic membrane and ear canal normal.  Left Ear: Tympanic membrane and ear canal normal.  Mouth/Throat: Oropharynx is clear and moist.  Eyes: Pupils are equal, round, and reactive to light. No scleral icterus.    Neck: Normal range of motion. No thyromegaly present.  Cardiovascular: Normal rate and regular rhythm.   No murmur heard. Pulmonary/Chest: Effort normal and breath sounds normal. No respiratory distress. He has no wheezes. He has no rales. He exhibits no tenderness.  Abdominal: Soft. Bowel sounds are normal. He exhibits no distension and no mass. There is no tenderness. There is no rebound and no guarding.  Musculoskeletal: He exhibits no edema.  Lymphadenopathy:    He has no cervical adenopathy.  Neurological: He is alert and oriented to person, place, and time. He has normal patellar reflexes. He exhibits normal muscle tone. Coordination normal.  Skin: Skin is warm and dry.  Psychiatric: He has a normal mood and affect. His behavior is normal. Judgment and thought content normal.           Assessment & Plan:   Preventative Care- discussed healthy diet exercise, weight loss.  Immunizations reviewed and up to date. Obtain routine lab work. Discussed GERD diet. Advised pt to keep upcoming apt with GI.       Assessment & Plan:  EKG tracing is personally reviewed.  EKG notes NSR.  No acute changes.

## 2016-10-24 NOTE — Patient Instructions (Addendum)
Work on [employer] diet, exercise and weight loss.  Schedule a routine eye exam. Complete lab work prior to leaving.

## 2016-10-24 NOTE — Progress Notes (Signed)
Pre visit review using our clinic review tool, if applicable. No additional management support is needed unless otherwise documented below in the visit note. 

## 2016-10-25 ENCOUNTER — Telehealth: Payer: Self-pay | Admitting: Family

## 2016-10-25 NOTE — Telephone Encounter (Signed)
Triglycerides are mildly elevated. Please work on avoiding concentrated sweets, and limiting white carbs (rice/bread/pasta/potatoes). Instead substitute whole grain versions with reasonable portions.  Was he fasting the day that he did this?  If not, please repeat fasting lipids please.    LFT's are worse most likely due to fatty liver. Continue to work on low fat diet/exercise and weight loss.  Does he drink alcohol, if so, I recommend that he discontinue.

## 2016-10-25 NOTE — Telephone Encounter (Signed)
Spoke with pt. about lab results.Pt.voiced understanding.Pt. Stated that he was fasting for labs. Pt said he has not drank alcohol in awhile. Advised pt he should discontinue drink alcohol.

## 2016-11-05 ENCOUNTER — Ambulatory Visit (INDEPENDENT_AMBULATORY_CARE_PROVIDER_SITE_OTHER): Payer: Managed Care, Other (non HMO) | Admitting: Internal Medicine

## 2016-11-05 ENCOUNTER — Encounter: Payer: Self-pay | Admitting: Internal Medicine

## 2016-11-05 VITALS — BP 114/80 | HR 64 | Ht 74.0 in | Wt 236.0 lb

## 2016-11-05 DIAGNOSIS — K219 Gastro-esophageal reflux disease without esophagitis: Secondary | ICD-10-CM

## 2016-11-05 DIAGNOSIS — K760 Fatty (change of) liver, not elsewhere classified: Secondary | ICD-10-CM

## 2016-11-05 DIAGNOSIS — E6609 Other obesity due to excess calories: Secondary | ICD-10-CM

## 2016-11-05 DIAGNOSIS — R7989 Other specified abnormal findings of blood chemistry: Secondary | ICD-10-CM

## 2016-11-05 DIAGNOSIS — R945 Abnormal results of liver function studies: Secondary | ICD-10-CM

## 2016-11-05 MED ORDER — PANTOPRAZOLE SODIUM 40 MG PO TBEC: 40.0000 mg | DELAYED_RELEASE_TABLET | Freq: Two times a day (BID) | ORAL | 3 refills | Status: DC

## 2016-11-05 NOTE — Progress Notes (Signed)
HISTORY OF PRESENT ILLNESS:  Gregory Fowler is a 41 y.o. male , Production designer, theatre/television/filmmanager of [employer], chronic GERD, fatty liver disease, and obesity who presents today with worsening reflux symptoms. The patient was initially evaluated in 11/17/2014. Previous patient of Dr. Sheryn Bisonavid Patterson. See that dictation. At that time he was advised with regards to reflux precautions, told to continue pantoprazole 40 mg daily, reassured regarding problems with belching, told to discontinue metoclopramide, and discussed the importance of weight loss and exercise for fatty liver disease. Itching follow-up in 2 years recommended. Patient reports to me that over the past month he has had worsening reflux symptoms. Lots of regurgitation, particularly with liquids. No true dysphagia. Some chest and epigastric discomfort. Remote EGD in 2012 revealed esophagitis and gastritis. Patient has been exercising. No weight loss. LFTs remain abnormal. Last blood work 10/24/2016 shows AST 53, ALT 108, alkaline phosphatase 72, total bilirubin 0.3. Normal CBC. Previous hepatitis serologies and iron testing negative or normal. He does not consume alcohol significantly. He does exercise proximal me 5 days per week and tries to watch his diet. No family history of liver disease. Perform smoker  REVIEW OF SYSTEMS:  All non-GI ROS negative upon comprehensive review  Past Medical History:  Diagnosis Date  . Abdominal pain, other specified site   . Backache, unspecified   . Fatty liver   . Gastroparesis   . GERD (gastroesophageal reflux disease)   . Hiatal hernia   . Kidney stone   . Nonspecific elevation of levels of transaminase or lactic acid dehydrogenase (LDH)     Past Surgical History:  Procedure Laterality Date  . lasix eye surgery Bilateral   . TONSILLECTOMY      Social History Gregory Fowler  reports that he quit smoking about 19 months ago. His smoking use included Cigarettes. He has a 30.00  pack-year smoking history. He has quit using smokeless tobacco. His smokeless tobacco use included Chew. He reports that he drinks alcohol. He reports that he does not use drugs.  family history includes Heart disease in his paternal aunt and paternal grandmother; Liver disease in his mother.  No Known Allergies     PHYSICAL EXAMINATION: Vital signs: BP 114/80   Pulse 64   Ht 6\' 2"  (1.88 m)   Wt 236 lb (107 kg)   BMI 30.30 kg/m   Constitutional: generally well-appearing, no acute distress Psychiatric: alert and oriented x3, cooperative Eyes: extraocular movements intact, anicteric, conjunctiva pink Mouth: oral pharynx moist, no lesions Neck: supple no lymphadenopathy Cardiovascular: heart regular rate and rhythm, no murmur Lungs: clear to auscultation bilaterally Abdomen: soft, nontender, nondistended, no obvious ascites, no peritoneal signs, normal bowel sounds, no organomegaly Rectal:Omitted Extremities: no clubbing cyanosis or lower extremity edema bilaterally Skin: no lesions on visible extremities Neuro: No focal deficits. Cranial nerves intact. No asterixis.    ASSESSMENT:  #1. Chronic GERD. Recent exacerbation of symptoms. Some lower chest and epigastric discomfort #2. Fatty liver disease with abnormal liver tests and abnormal ultrasound #3. Obesity   PLAN:  #1. Reflux precautions #2. Exercise and weight loss #3. Recommended that he see a trainer regarding exercise regimen and a nutritionist regarding diet #4. Discussed the potential for fatty liver 2, cirrhosis #5. Increase pantoprazole 40 mg twice daily. Prescribed #6. Schedule upper endoscopy.The nature of the procedure, as well as the risks, benefits, and alternatives were carefully and thoroughly reviewed with the patient. Ample time for discussion and questions allowed. The patient understood, was satisfied,  and agreed to proceed.

## 2016-11-05 NOTE — Patient Instructions (Signed)
We have sent the following medications to your pharmacy for you to pick up at your convenience:  Pantoprazole  You have been scheduled for an endoscopy. Please follow written instructions given to you at your visit today. If you use inhalers (even only as needed), please bring them with you on the day of your procedure. Your physician has requested that you go to www.startemmi.com and enter the access code given to you at your visit today. This web site gives a general overview about your procedure. However, you should still follow specific instructions given to you by our office regarding your preparation for the procedure.   Continue to work on exercise and weight loss

## 2017-01-08 ENCOUNTER — Encounter: Payer: Self-pay | Admitting: Internal Medicine

## 2017-01-08 ENCOUNTER — Encounter: Payer: Managed Care, Other (non HMO) | Admitting: Internal Medicine

## 2017-01-21 ENCOUNTER — Encounter: Payer: Managed Care, Other (non HMO) | Admitting: Internal Medicine

## 2017-01-25 ENCOUNTER — Ambulatory Visit (INDEPENDENT_AMBULATORY_CARE_PROVIDER_SITE_OTHER): Payer: Managed Care, Other (non HMO) | Admitting: Family Medicine

## 2017-01-25 ENCOUNTER — Encounter: Payer: Self-pay | Admitting: Family Medicine

## 2017-01-25 VITALS — BP 102/60 | HR 85 | Temp 97.9°F | Ht 74.0 in | Wt 239.4 lb

## 2017-01-25 DIAGNOSIS — H6591 Unspecified nonsuppurative otitis media, right ear: Secondary | ICD-10-CM

## 2017-01-25 MED ORDER — FLUTICASONE PROPIONATE 50 MCG/ACT NA SUSP: 2.0000 | Freq: Every day | NASAL | 0 refills | Status: DC

## 2017-01-25 MED ORDER — PREDNISONE 20 MG PO TABS: 40.0000 mg | ORAL_TABLET | Freq: Every day | ORAL | 0 refills | Status: AC

## 2017-01-25 NOTE — Progress Notes (Signed)
Chief Complaint  Patient presents with  . Follow-up    on sinusitis along with (R) ear pain and dizziness-[facility]    Ayesha Mohaironnie Greg Correia here for URI complaints.  Duration: 1 month  Associated symptoms: ear pain and headache Denies: rhinorrhea, ear drainage, shortness of breath and fevers/rigors Treatment to date: Amoxicillin, Augmentin, currently taking steroids Sick contacts: Yes- son and wife had sinus infections but seem to have gotten better  ROS:  Const: Denies fevers HEENT: As noted in HPI Lungs: No SOB  Past Medical History:  Diagnosis Date  . Abdominal pain, other specified site   . Backache, unspecified   . Fatty liver   . Gastroparesis   . GERD (gastroesophageal reflux disease)   . Hiatal hernia   . Kidney stone   . Nonspecific elevation of levels of transaminase or lactic acid dehydrogenase (LDH)    Family History  Problem Relation Age of Onset  . Liver disease Mother        fatty liver  . Heart disease Paternal Aunt   . Heart disease Paternal Grandmother   . Colon cancer Neg Hx   . Colon polyps Neg Hx   . Diabetes Neg Hx   . Kidney disease Neg Hx   . Gallbladder disease Neg Hx   . Esophageal cancer Neg Hx     BP 102/60 (BP Location: Left Arm, Patient Position: Sitting, Cuff Size: Large)   Pulse 85   Temp 97.9 F (36.6 C) (Oral)   Ht 6\' 2"  (1.88 m)   Wt 239 lb 6.4 oz (108.6 kg)   SpO2 96%   BMI 30.74 kg/m  General: Awake, alert, appears stated age HEENT: AT, [place], ears patent b/l and TM neg on L, mild bulging and fluid present on R, nares patent w/o discharge, pharynx pink and without exudates, MMM Neck: No masses or asymmetry Heart: RRR, no murmurs, no bruits Lungs: CTAB, no accessory muscle use Psych: Age appropriate judgment and insight, normal mood and affect   Fluid level behind tympanic membrane of right ear - Plan: fluticasone (FLONASE) 50 MCG/ACT nasal spray, predniSONE (DELTASONE) 20 MG  tablet  Orders as above. Extend steroid course by 5 days. May need ENT if this is not improved. INCS, aim towards same side eye. F/u sooner if fevers, drainage, worsening pain or new symptoms. F/u 1 one week if no better. Pt voiced understanding and agreement to the plan.  Jilda Rocheicholas Paul MaunaloaWendling, DO 01/25/17 1:55 PM

## 2017-01-25 NOTE — Patient Instructions (Addendum)
Aim towards the same side eye when you use the nasal spray.  If you start having fevers, drainage from the ear, worsening pain or new symptoms, go to an urgent care or let us know.

## 2017-02-04 ENCOUNTER — Ambulatory Visit (INDEPENDENT_AMBULATORY_CARE_PROVIDER_SITE_OTHER): Payer: Managed Care, Other (non HMO) | Admitting: Family

## 2017-02-04 ENCOUNTER — Encounter: Payer: Self-pay | Admitting: Family

## 2017-02-04 VITALS — BP 127/74 | HR 73 | Temp 97.9°F | Resp 18 | Ht 74.0 in | Wt 234.6 lb

## 2017-02-04 DIAGNOSIS — L609 Nail disorder, unspecified: Secondary | ICD-10-CM

## 2017-02-04 DIAGNOSIS — J019 Acute sinusitis, unspecified: Secondary | ICD-10-CM | POA: Diagnosis not present

## 2017-02-04 DIAGNOSIS — H9201 Otalgia, right ear: Secondary | ICD-10-CM

## 2017-02-04 MED ORDER — LEVOFLOXACIN 500 MG PO TABS: 500.0000 mg | ORAL_TABLET | Freq: Every day | ORAL | 0 refills | Status: DC

## 2017-02-04 NOTE — Progress Notes (Signed)
Subjective:     Patient ID: Gregory Fowler, male   DOB: 09/18/1975, 41 y.o.   MRN: [id number]  HPI   [name] is a 41 yr old male who presents today with chief complaint of headache and otalgia.  Reports that symptoms have been present x 1 month.  He saw Dr. Carmelia Roller on 01/25/17 and was given rx for flonase, prednisone course was extended by 5 more days.  Previous antibiotic treatments include amoxicillin and augmentin.    He reports that his right ear "hurts like crazy, teeth hurt, and my whole head is hurting." Denies fever or feeling unusually run down.    Nail problem- reports nails are peeling off of fingers/toes.     Review of Systems See HPI  Past Medical History:  Diagnosis Date  . Abdominal pain, other specified site   . Backache, unspecified   . Fatty liver   . Gastroparesis   . GERD (gastroesophageal reflux disease)   . Hiatal hernia   . Kidney stone   . Nonspecific elevation of levels of transaminase or lactic acid dehydrogenase (LDH)      Social History   Social History  . Marital status: Married    Spouse name: N/A  . Number of children: 2  . Years of education: N/A   Occupational History  . Shop Chemical engineer   Social History Main Topics  . Smoking status: Former Smoker    Packs/day: 1.00    Years: 30.00    Types: Cigarettes    Quit date: 03/12/2015  . Smokeless tobacco: Former Neurosurgeon    Types: Chew  . Alcohol use 0.0 oz/week     Comment: occasional  . Drug use: No  . Sexual activity: Not on file   Other Topics Concern  . 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   Daughter-2013   Son- 2009   Married   Enjoys playing golf   Completed 12th grade       Past Surgical History:  Procedure Laterality Date  . lasix eye surgery Bilateral   . TONSILLECTOMY      Family History  Problem Relation Age of Onset  . Liver disease Mother        fatty liver  .  Heart disease Paternal Aunt   . Heart disease Paternal Grandmother   . Colon cancer Neg Hx   . Colon polyps Neg Hx   . Diabetes Neg Hx   . Kidney disease Neg Hx   . Gallbladder disease Neg Hx   . Esophageal cancer Neg Hx     No Known Allergies  Current Outpatient Prescriptions on File Prior to Visit  Medication Sig Dispense Refill  . fluticasone (FLONASE) 50 MCG/ACT nasal spray Place 2 sprays into both nostrils daily. 16 g 0  . Omega-3 Fatty Acids (FISH OIL PO) Take 1 capsule by mouth daily.    . pantoprazole (PROTONIX) 40 MG tablet Take 1 tablet (40 mg total) by mouth 2 (two) times daily. 180 tablet 3  . Probiotic Product (PROBIOTIC DAILY) CAPS Take by mouth daily.    Marland Kitchen Specialty Vitamins Products (ULTIMATE FAT BURNER PO) Take 1 tablet by mouth daily.     No current facility-administered medications on file prior to visit.     BP 127/74 (BP Location: Right Arm, Cuff Size: Large)   Pulse 73   Temp 97.9 F (36.6 C) (Oral)  Resp 18   Ht 6\' 2"  (1.88 m)   Wt 234 lb 9.6 oz (106.4 kg)   SpO2 99%   BMI 30.12 kg/m      Objective:   Physical Exam  Constitutional: He is oriented to person, place, and time. He appears well-developed and well-nourished. No distress.  HENT:  Head: Normocephalic and atraumatic.  Right tympanic membrane appears dull. He appears tympanic membrane. There is no bulging or obvious fluid level, Or erythema.  Left tympanic membrane is mildly dull without scarring. No erythema   Cardiovascular: Normal rate and regular rhythm.   No murmur heard. Pulmonary/Chest: Effort normal and breath sounds normal. No respiratory distress. He has no wheezes. He has no rales.  Musculoskeletal: He exhibits no edema.  Neurological: He is alert and oriented to person, place, and time.  Skin: Skin is warm and dry.  He has several fingernails and several toenails which are peeling and  cracked. They are not particularly thickened  Psychiatric: He has a normal mood and affect.  His behavior is normal. Thought content normal.        Assessment:     #1 otalgia/sinusitis- patient with one-month history right thyroid otalgia. Lobectomy eustachian tube dysfunction. He continues to have aching in his teeth. Most likely sinusitis. Will begin Levaquin. Will also refer him to ENT.  #2 nail problem-I am not sure if this is actually fungal or not. Will refer to dermatology for further evaluation      Plan:

## 2017-02-04 NOTE — Patient Instructions (Signed)
Continue probiotic. Begin Levaquin (antibiotic) once daily.  You will be contacted about your referral to dermatology and Ear nose and throat doctor.

## 2017-02-06 ENCOUNTER — Telehealth: Payer: Self-pay | Admitting: *Deleted

## 2017-02-06 NOTE — Telephone Encounter (Signed)
Received call from Dr Nicholes RoughApplebaum [phone] cell#) stating pt in office now and had to have infected tooth extracted. She is wanting to start pt on Clindamycin and is aware that we had prescribed him Levaquin on 02/04/17 for possible sinusitis/otalgia. Per verbal from PCP, ok to stop Levaquin now and start Clindamycin per dentist recommendation. Verbal given to Dr Nicholes RoughApplebaum.

## 2017-04-12 ENCOUNTER — Encounter: Payer: Self-pay | Admitting: Family Medicine

## 2017-04-12 ENCOUNTER — Ambulatory Visit (INDEPENDENT_AMBULATORY_CARE_PROVIDER_SITE_OTHER): Payer: Self-pay | Admitting: Family Medicine

## 2017-04-12 VITALS — BP 118/67 | HR 87 | Temp 98.1°F | Resp 16 | Ht 72.5 in | Wt 241.6 lb

## 2017-04-12 DIAGNOSIS — Z024 Encounter for examination for driving license: Secondary | ICD-10-CM

## 2017-04-12 NOTE — Progress Notes (Signed)
Subjective:    Patient ID: Gregory Fowler, male    DOB: 1976/07/06, 41 y.o.   MRN: [id number]  04/12/2017  DOT PHYSICAL   HPI This 41 y.o. male presents for DOT exam. Last DOB 04/18/2017.   No glasses or contacts.    Visual Acuity Screening   Right eye Left eye Both eyes  Without correction: 20/20 20/20 20/20   With correction:     Comments: Periph: R  85 degrees  L  85 degrees Colors: 6/6  Hearing Screening Comments: Whisper test: R-10 ft  L-10 ft  BP Readings from Last 3 Encounters:  04/12/17 118/67  02/04/17 127/74  01/25/17 102/60    Review of Systems  Constitutional: Negative for activity change, appetite change, chills, diaphoresis, fatigue, fever and unexpected weight change.  HENT: Negative for congestion, dental problem, drooling, ear discharge, ear pain, facial swelling, hearing loss, mouth sores, nosebleeds, postnasal drip, rhinorrhea, sinus pressure, sneezing, sore throat, tinnitus, trouble swallowing and voice change.   Eyes: Negative for photophobia, pain, discharge, redness, itching and visual disturbance.  Respiratory: Negative for apnea, cough, choking, chest tightness, shortness of breath, wheezing and stridor.   Cardiovascular: Negative for chest pain, palpitations and leg swelling.  Gastrointestinal: Negative for abdominal pain, blood in stool, constipation, diarrhea, nausea and vomiting.  Endocrine: Negative for cold intolerance, heat intolerance, polydipsia, polyphagia and polyuria.  Genitourinary: Negative for decreased urine volume, difficulty urinating, discharge, dysuria, enuresis, flank pain, frequency, genital sores, hematuria, penile pain, penile swelling, scrotal swelling, testicular pain and urgency.       Nocturia x 0.  Musculoskeletal: Negative for arthralgias, back pain, gait problem, joint swelling, myalgias, neck pain and neck stiffness.  Skin: Negative for color change, pallor, rash and wound.  Allergic/Immunologic: Negative for  environmental allergies, food allergies and immunocompromised state.  Neurological: Negative for dizziness, tremors, seizures, syncope, facial asymmetry, speech difficulty, weakness, light-headedness, numbness and headaches.  Hematological: Negative for adenopathy. Does not bruise/bleed easily.  Psychiatric/Behavioral: Negative for agitation, behavioral problems, confusion, decreased concentration, dysphoric mood, hallucinations, self-injury, sleep disturbance and suicidal ideas. The patient is not nervous/anxious and is not hyperactive.        Bedtime 1000; wakes up 500-630.   Past Medical History:  Diagnosis Date  . Abdominal pain, other specified site   . Backache, unspecified   . Fatty liver   . Gastroparesis   . GERD (gastroesophageal reflux disease)   . Hiatal hernia   . Kidney stone   . Nonspecific elevation of levels of transaminase or lactic acid dehydrogenase (LDH)    Past Surgical History:  Procedure Laterality Date  . lasix eye surgery Bilateral   . TONSILLECTOMY     No Known Allergies Current Outpatient Prescriptions on File Prior to Visit  Medication Sig Dispense Refill  . Omega-3 Fatty Acids (FISH OIL PO) Take 1 capsule by mouth daily.    . pantoprazole (PROTONIX) 40 MG tablet Take 1 tablet (40 mg total) by mouth 2 (two) times daily. 180 tablet 3  . Probiotic Product (PROBIOTIC DAILY) CAPS Take by mouth daily.    Marland Kitchen. Specialty Vitamins Products (ULTIMATE FAT BURNER PO) Take 1 tablet by mouth daily.     No current facility-administered medications on file prior to visit.    Social History   Social History  . Marital status: Married    Spouse name: N/A  . Number of children: 2  . Years of education: N/A   Occupational History  . Shop Chemical engineerManager [employer]  Social History Main Topics  . Smoking status: Former Smoker    Packs/day: 1.00    Years: 30.00    Types: Cigarettes    Quit date: 03/12/2015  . Smokeless tobacco: Former Neurosurgeon    Types: Chew  .  Alcohol use 0.6 oz/week    1 Cans of beer per week     Comment: occasional  . Drug use: No  . Sexual activity: Not on file   Other Topics Concern  . 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   Daughter-2013   Son- 2009   Married   Enjoys playing golf   Completed 12th grade   No DWI.    Family History  Problem Relation Age of Onset  . Liver disease Mother        fatty liver  . Heart disease Paternal Aunt   . Heart disease Paternal Grandmother   . Colon cancer Neg Hx   . Colon polyps Neg Hx   . Diabetes Neg Hx   . Kidney disease Neg Hx   . Gallbladder disease Neg Hx   . Esophageal cancer Neg Hx         Objective:    BP 118/67 (BP Location: Right Arm, Patient Position: Sitting, Cuff Size: Large)   Pulse 87   Temp 98.1 F (36.7 C) (Oral)   Resp 16   Ht 6' 0.5" (1.842 m)   Wt 241 lb 9.6 oz (109.6 kg)   SpO2 96%   BMI 32.32 kg/m  Physical Exam  Constitutional: He is oriented to person, place, and time. He appears well-developed and well-nourished. No distress.  HENT:  Head: Normocephalic and atraumatic.  Right Ear: External ear normal.  Left Ear: External ear normal.  Nose: Nose normal.  Mouth/Throat: Oropharynx is clear and moist.  Eyes: Pupils are equal, round, and reactive to light. Conjunctivae and EOM are normal.  Neck: Normal range of motion. Neck supple. Carotid bruit is not present. No thyromegaly present.  Cardiovascular: Normal rate, regular rhythm, normal heart sounds and intact distal pulses.  Exam reveals no gallop and no friction rub.   No murmur heard. Pulmonary/Chest: Effort normal and breath sounds normal. He has no wheezes. He has no rales.  Abdominal: Soft. Bowel sounds are normal. He exhibits no distension and no mass. There is no tenderness. There is no rebound and no guarding. Hernia confirmed negative in the right inguinal area and confirmed negative in the left inguinal area.    Genitourinary: Testes normal and penis normal.  Musculoskeletal:       Right shoulder: Normal.       Left shoulder: Normal.       Right elbow: Normal.      Left elbow: Normal.       Right wrist: Normal.       Left wrist: Normal.       Right knee: Normal.       Left knee: Normal.       Right ankle: Normal.       Left ankle: Normal.       Cervical back: Normal.       Lumbar back: Normal. He exhibits normal range of motion.       Right hand: Normal.       Left hand: Normal.       Right foot: Normal.       Left foot: Normal.  Lymphadenopathy:  He has no cervical adenopathy.  Neurological: He is alert and oriented to person, place, and time. He has normal reflexes. No cranial nerve deficit. He exhibits normal muscle tone. Coordination normal.  Skin: Skin is warm and dry. No rash noted. He is not diaphoretic.  Psychiatric: He has a normal mood and affect. His behavior is normal. Judgment and thought content normal.   Depression screen [facility] 2/9 04/12/2017 04/19/2015  Decreased Interest 0 0  Down, Depressed, Hopeless 0 0  PHQ - 2 Score 0 0       Assessment & Plan:   1. 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 -normal vision and hearing.   No orders of the defined types were placed in this encounter.  No orders of the defined types were placed in this encounter.   Return in about 2 years (around 04/13/2019).   Gregory Fowler Paulita Fujita, M.D. Primary Care at [facility] [address] [place], [place]  [address] [phone] phone [phone] fax

## 2017-04-12 NOTE — Patient Instructions (Signed)
     IF you received an x-ray today, you will receive an invoice from [facility]. Please contact [facility] at [phone] with questions or concerns regarding your invoice.   IF you received labwork today, you will receive an invoice from [facility]. Please contact [facility] at [phone] with questions or concerns regarding your invoice.   Our billing staff will not be able to assist you with questions regarding bills from these companies.  You will be contacted with the lab results as soon as they are available. The fastest way to get your results is to activate your My Chart account. Instructions are located on the last page of this paperwork. If you have not heard from us regarding the results in 2 weeks, please contact this office.     

## 2017-05-14 ENCOUNTER — Telehealth: Payer: Self-pay | Admitting: Family

## 2017-05-14 NOTE — Telephone Encounter (Signed)
Caller name: Rumaldo  Relation to pt: self Call back number: [phone] Pharmacy: [facility] [address] [facility]  Reason for call: Pt called stating was prescribe Chantix helps

## 2017-05-15 NOTE — Telephone Encounter (Signed)
Needs office visit please before we can rx chantix. I don't see that we have given it to him before.

## 2017-05-17 NOTE — Telephone Encounter (Signed)
Notified pt and scheduled rx for 05/22/17 at 12:45pm.

## 2017-05-22 ENCOUNTER — Encounter: Payer: Self-pay | Admitting: Family

## 2017-05-22 ENCOUNTER — Ambulatory Visit (INDEPENDENT_AMBULATORY_CARE_PROVIDER_SITE_OTHER): Payer: BLUE CROSS/BLUE SHIELD | Admitting: Family

## 2017-05-22 VITALS — BP 122/80 | HR 80 | Temp 98.5°F | Resp 16 | Ht 73.0 in | Wt 231.2 lb

## 2017-05-22 DIAGNOSIS — Z23 Encounter for immunization: Secondary | ICD-10-CM | POA: Diagnosis not present

## 2017-05-22 DIAGNOSIS — Z720 Tobacco use: Secondary | ICD-10-CM

## 2017-05-22 DIAGNOSIS — R6889 Other general symptoms and signs: Secondary | ICD-10-CM

## 2017-05-22 DIAGNOSIS — K760 Fatty (change of) liver, not elsewhere classified: Secondary | ICD-10-CM

## 2017-05-22 LAB — HEPATIC FUNCTION PANEL
ALK PHOS: 68 U/L (ref 39–117)
ALT: 56 U/L — ABNORMAL HIGH (ref 0–53)
AST: 33 U/L (ref 0–37)
Albumin: 4.6 g/dL (ref 3.5–5.2)
BILIRUBIN DIRECT: 0.1 mg/dL (ref 0.0–0.3)
BILIRUBIN TOTAL: 0.4 mg/dL (ref 0.2–1.2)
Total Protein: 7.3 g/dL (ref 6.0–8.3)

## 2017-05-22 LAB — TSH: TSH: 1.14 u[IU]/mL (ref 0.35–4.50)

## 2017-05-22 MED ORDER — VARENICLINE TARTRATE 0.5 MG X 11 & 1 MG X 42 PO MISC: ORAL | 0 refills | Status: DC

## 2017-05-22 NOTE — Progress Notes (Signed)
Subjective:    Patient ID: Gregory Fowler, male    DOB: December 01, 1975, 41 y.o.   MRN: [id number]  HPI  Tobacco abuse- smoking 1PPD since the end of august. Quit previously using chantix.    Fatty liver- Reports no ETOH in >1 month.   Reports that he gets hot very easily.    Review of Systems    see HPI  Past Medical History:  Diagnosis Date  . Abdominal pain, other specified site   . Backache, unspecified   . Fatty liver   . Gastroparesis   . GERD (gastroesophageal reflux disease)   . Hiatal hernia   . Kidney stone   . Nonspecific elevation of levels of transaminase or lactic acid dehydrogenase (LDH)      Social History   Social History  . Marital status: Married    Spouse name: N/A  . Number of children: 2  . Years of education: N/A   Occupational History  . Shop Chemical engineer   Social History Main Topics  . Smoking status: Current Every Day Smoker    Packs/day: 1.00    Years: 30.00    Types: Cigarettes    Start date: 04/13/2017  . Smokeless tobacco: Former Neurosurgeon    Types: Chew  . Alcohol use 0.6 oz/week    1 Cans of beer per week     Comment: occasional  . Drug use: No  . Sexual activity: Not on file   Other Topics Concern  . 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   Daughter-2013   Son- 2009   Married   Enjoys playing golf   Completed 12th grade   No DWI.     Past Surgical History:  Procedure Laterality Date  . lasix eye surgery Bilateral   . TONSILLECTOMY      Family History  Problem Relation Age of Onset  . Liver disease Mother        fatty liver  . Heart disease Paternal Aunt   . Heart disease Paternal Grandmother   . Colon cancer Neg Hx   . Colon polyps Neg Hx   . Diabetes Neg Hx   . Kidney disease Neg Hx   . Gallbladder disease Neg Hx   . Esophageal cancer Neg Hx     No Known Allergies  Current Outpatient Prescriptions on File Prior to  Visit  Medication Sig Dispense Refill  . Omega-3 Fatty Acids (FISH OIL PO) Take 1 capsule by mouth daily.    . pantoprazole (PROTONIX) 40 MG tablet Take 1 tablet (40 mg total) by mouth 2 (two) times daily. 180 tablet 3  . Probiotic Product (PROBIOTIC DAILY) CAPS Take by mouth daily.    Marland Kitchen Specialty Vitamins Products (ULTIMATE FAT BURNER PO) Take 1 tablet by mouth daily.     No current facility-administered medications on file prior to visit.     BP 122/80 (BP Location: Right Arm, Cuff Size: Large)   Pulse 80   Temp 98.5 F (36.9 C) (Oral)   Resp 16   Ht  (1.854 m)   Wt 231 lb 3.2 oz (104.9 kg)   SpO2 98%   BMI 30.50 kg/m    Objective:   Physical Exam  Constitutional: He is oriented to person, place, and time. He appears well-developed and well-nourished. No distress.  HENT:  Head: Normocephalic and atraumatic.  Cardiovascular: Normal rate and regular  rhythm.   No murmur heard. Pulmonary/Chest: Effort normal and breath sounds normal. No respiratory distress. He has no wheezes. He has no rales.  Musculoskeletal: He exhibits no edema.  Neurological: He is alert and oriented to person, place, and time.  Skin: Skin is warm and dry.  Psychiatric: He has a normal mood and affect. His behavior is normal. Thought content normal.          Assessment & Plan:  Tobacco abuse- rx with chantix.  He has tolerated previously.  Heat intolerance- check TSH

## 2017-05-22 NOTE — Patient Instructions (Addendum)
Start chantix.  You can smoke for the first week on chantix, then stop smoking. Send me a message via mychart in 3 weeks to let me know how it is going with chantix and I will send refills.  You will need to complete 12 weeks on chantix.  Complete lab work prior to leaving.

## 2017-05-23 ENCOUNTER — Encounter: Payer: Self-pay | Admitting: Family

## 2017-05-23 NOTE — Assessment & Plan Note (Signed)
Repeat lft's improved. Continue low fat/low cholesterol diet.

## 2017-06-14 ENCOUNTER — Encounter: Payer: Self-pay | Admitting: Family

## 2017-06-17 ENCOUNTER — Other Ambulatory Visit: Payer: Self-pay | Admitting: Family

## 2017-06-17 MED ORDER — VARENICLINE TARTRATE 1 MG PO TABS: 1.0000 mg | ORAL_TABLET | Freq: Two times a day (BID) | ORAL | 1 refills | Status: DC

## 2017-07-01 ENCOUNTER — Telehealth: Payer: Self-pay | Admitting: Internal Medicine

## 2017-07-24 ENCOUNTER — Encounter: Payer: Self-pay | Admitting: Family

## 2017-07-26 NOTE — Telephone Encounter (Signed)
PA for Pantoprazole approved

## 2017-11-13 ENCOUNTER — Encounter: Payer: Self-pay | Admitting: Family

## 2017-11-13 ENCOUNTER — Ambulatory Visit (INDEPENDENT_AMBULATORY_CARE_PROVIDER_SITE_OTHER): Payer: BLUE CROSS/BLUE SHIELD | Admitting: Family

## 2017-11-13 VITALS — BP 112/83 | HR 74 | Temp 98.0°F | Resp 16 | Ht 73.0 in | Wt 229.0 lb

## 2017-11-13 DIAGNOSIS — Z0000 Encounter for general adult medical examination without abnormal findings: Secondary | ICD-10-CM

## 2017-11-13 LAB — BASIC METABOLIC PANEL
BUN: 19 mg/dL (ref 6–23)
CHLORIDE: 101 meq/L (ref 96–112)
CO2: 30 mEq/L (ref 19–32)
Calcium: 9.6 mg/dL (ref 8.4–10.5)
Creatinine, Ser: 0.93 mg/dL (ref 0.40–1.50)
GFR: 94.79 mL/min (ref >60.00)
Glucose, Bld: 92 mg/dL (ref 70–99)
POTASSIUM: 4.3 meq/L (ref 3.5–5.1)
SODIUM: 138 meq/L (ref 135–145)

## 2017-11-13 LAB — CBC WITH DIFFERENTIAL/PLATELET
BASOS PCT: 1 % (ref 0.0–3.0)
Basophils Absolute: 0.1 10*3/uL (ref 0.0–0.1)
Eosinophils Absolute: 0.2 10*3/uL (ref 0.0–0.7)
Eosinophils Relative: 2.6 % (ref 0.0–5.0)
HEMATOCRIT: 44.6 % (ref 39.0–52.0)
HEMOGLOBIN: 15.3 g/dL (ref 13.0–17.0)
LYMPHS PCT: 35.2 % (ref 12.0–46.0)
Lymphs Abs: 2.5 10*3/uL (ref 0.7–4.0)
MCHC: 34.3 g/dL (ref 30.0–36.0)
MCV: 84.2 fl (ref 78.0–100.0)
MONOS PCT: 7.4 % (ref 3.0–12.0)
Monocytes Absolute: 0.5 10*3/uL (ref 0.1–1.0)
NEUTROS ABS: 3.8 10*3/uL (ref 1.4–7.7)
Neutrophils Relative %: 53.8 % (ref 43.0–77.0)
PLATELETS: 198 10*3/uL (ref 150.0–400.0)
RBC: 5.29 Mil/uL (ref 4.22–5.81)
RDW: 13.5 % (ref 11.5–15.5)
WBC: 7.1 10*3/uL (ref 4.0–10.5)

## 2017-11-13 LAB — URINALYSIS, ROUTINE W REFLEX MICROSCOPIC
BILIRUBIN URINE: NEGATIVE
HGB URINE DIPSTICK: NEGATIVE
KETONES UR: NEGATIVE
LEUKOCYTES UA: NEGATIVE
NITRITE: NEGATIVE
RBC / HPF: NONE SEEN (ref 0–?)
Specific Gravity, Urine: 1.025 (ref 1.000–1.030)
Total Protein, Urine: NEGATIVE
UROBILINOGEN UA: 0.2 (ref 0.0–1.0)
Urine Glucose: NEGATIVE
pH: 6.5 (ref 5.0–8.0)

## 2017-11-13 LAB — HEPATIC FUNCTION PANEL
ALBUMIN: 4.3 g/dL (ref 3.5–5.2)
ALT: 39 U/L (ref 0–53)
AST: 24 U/L (ref 0–37)
Alkaline Phosphatase: 69 U/L (ref 39–117)
BILIRUBIN DIRECT: 0.1 mg/dL (ref 0.0–0.3)
TOTAL PROTEIN: 7.3 g/dL (ref 6.0–8.3)
Total Bilirubin: 0.4 mg/dL (ref 0.2–1.2)

## 2017-11-13 LAB — LIPID PANEL
Cholesterol: 217 mg/dL — ABNORMAL HIGH (ref 0–200)
HDL: 32.3 mg/dL — AB (ref >39.00)
NonHDL: 184.3
TRIGLYCERIDES: 222 mg/dL — AB (ref 0.0–149.0)
Total CHOL/HDL Ratio: 7
VLDL: 44.4 mg/dL — ABNORMAL HIGH (ref 0.0–40.0)

## 2017-11-13 LAB — TSH: TSH: 1.24 u[IU]/mL (ref 0.35–4.50)

## 2017-11-13 LAB — LDL CHOLESTEROL, DIRECT: LDL DIRECT: 161 mg/dL

## 2017-11-13 MED ORDER — PANTOPRAZOLE SODIUM 40 MG PO TBEC: 40.0000 mg | DELAYED_RELEASE_TABLET | Freq: Two times a day (BID) | ORAL | 3 refills | Status: DC

## 2017-11-13 NOTE — Progress Notes (Signed)
Subjective:    Patient ID: Gregory Fowler, male    DOB: Mar 31, 1976, 42 y.o.   MRN: [id number]  HPI  Patient presents today for complete physical.  Immunizations: tetanus 2016 Diet: reports diet is healthy Exercise: weights/cardio Wt Readings from Last 3 Encounters:  11/13/17 229 lb (103.9 kg)  05/22/17 231 lb 3.2 oz (104.9 kg)  04/12/17 241 lb 9.6 oz (109.6 kg)  dental: up to date Vision: several years back  Tobacco abuse- plans to "get through summer then quit."    Review of Systems  Constitutional: Negative for unexpected weight change.  HENT: Negative for hearing loss and rhinorrhea.   Eyes: Negative for visual disturbance.  Respiratory: Negative for cough.   Gastrointestinal: Negative for blood in stool, constipation and diarrhea.  Genitourinary: Negative for dysuria, frequency and hematuria.  Musculoskeletal: Negative for arthralgias and myalgias.  Skin: Negative for rash.  Neurological: Negative for headaches.  Hematological: Negative for adenopathy.  Psychiatric/Behavioral:       Denies depression anxiety   Past Medical History:  Diagnosis Date  . Abdominal pain, other specified site   . Backache, unspecified   . Fatty liver   . Gastroparesis   . GERD (gastroesophageal reflux disease)   . Hiatal hernia   . Kidney stone   . Nonspecific elevation of levels of transaminase or lactic acid dehydrogenase (LDH)      Social History   Socioeconomic History  . Marital status: Married    Spouse name: Not on file  . Number of children: 2  . Years of education: Not on file  . Highest education level: Not on file  Occupational History  . Occupation: Film/video editorhop Manager    Employer: [employer]  Social Needs  . Financial resource strain: Not on file  . Food insecurity:    Worry: Not on file    Inability: Not on file  . Transportation needs:    Medical: Not on file    Non-medical: Not on file  Tobacco Use  . Smoking status: Current Every Day  Smoker    Packs/day: 1.00    Years: 30.00    Pack years: 30.00    Types: Cigarettes    Start date: 04/13/2017  . Smokeless tobacco: Former NeurosurgeonUser    Types: Chew  Substance and Sexual Activity  . Alcohol use: Yes    Alcohol/week: 0.6 oz    Types: 1 Cans of beer per week    Comment: occasional  . Drug use: No  . Sexual activity: Not on file  Lifestyle  . Physical activity:    Days per week: Not on file    Minutes per session: Not on file  . Stress: Not on file  Relationships  . Social connections:    Talks on phone: Not on file    Gets together: Not on file    Attends religious service: Not on file    Active member of club or organization: Not on file    Attends meetings of clubs or organizations: Not on file    Relationship status: Not on file  . Intimate partner violence:    Fear of current or ex partner: Not on file    Emotionally abused: Not on file    Physically abused: Not on file    Forced sexual activity: Not on file  Other Topics Concern  . 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  Daughter-2013   Son- 2009   Married   Enjoys playing golf   Completed 12th grade   No DWI.     Past Surgical History:  Procedure Laterality Date  . lasix eye surgery Bilateral   . TONSILLECTOMY      Family History  Problem Relation Age of Onset  . Liver disease Mother        fatty liver  . Heart disease Paternal Aunt   . Heart disease Paternal Grandmother   . Colon cancer Neg Hx   . Colon polyps Neg Hx   . Diabetes Neg Hx   . Kidney disease Neg Hx   . Gallbladder disease Neg Hx   . Esophageal cancer Neg Hx     No Known Allergies  Current Outpatient Medications on File Prior to Visit  Medication Sig Dispense Refill  . Multiple Vitamin (DAILY VITAMIN PO) Take 1 packet by mouth daily. MEN'S SPORT VITAMIN PACK.  Omega 3, Grape Seed Extract, Milk Thistle Extract, Tribulus Terrestris, Vit D3.    . Omega-3 Fatty  Acids (FISH OIL PO) Take 1 capsule by mouth daily.    . pantoprazole (PROTONIX) 40 MG tablet Take 1 tablet (40 mg total) by mouth 2 (two) times daily. 180 tablet 3  . Probiotic Product (PROBIOTIC DAILY) CAPS Take by mouth daily.    Marland Kitchen Specialty Vitamins Products (ULTIMATE FAT BURNER PO) Take 1 tablet by mouth daily.    . varenicline (CHANTIX CONTINUING MONTH PAK) 1 MG tablet Take 1 tablet (1 mg total) 2 (two) times daily by mouth. 60 tablet 1   No current facility-administered medications on file prior to visit.     BP 112/83 (BP Location: Left Arm, Patient Position: Sitting, Cuff Size: Large)   Pulse 74   Temp 98 F (36.7 C) (Oral)   Resp 16   Ht 6\' 1"  (1.854 m)   Wt 229 lb (103.9 kg)   SpO2 100%   BMI 30.21 kg/m        Objective:   Physical Exam  Physical Exam  Constitutional: He is oriented to person, place, and time. He appears well-developed and well-nourished. No distress.  HENT:  Head: Normocephalic and atraumatic.  Right Ear: Tympanic membrane and ear canal normal.  Left Ear: Tympanic membrane and ear canal normal.  Mouth/Throat: Oropharynx is clear and moist.  Eyes: Pupils are equal, round, and reactive to light. No scleral icterus.  Neck: Normal range of motion. No thyromegaly present.  Cardiovascular: Normal rate and regular rhythm.   No murmur heard. Pulmonary/Chest: Effort normal and breath sounds normal. No respiratory distress. He has no wheezes. He has no rales. He exhibits no tenderness.  Abdominal: Soft. Bowel sounds are normal. He exhibits no distension and no mass. There is no tenderness. There is no rebound and no guarding.  Musculoskeletal: He exhibits no edema.  Lymphadenopathy:    He has no cervical adenopathy.  Neurological: He is alert and oriented to person, place, and time. He has normal patellar reflexes. He exhibits normal muscle tone. Coordination normal.  Skin: Skin is warm and dry.  Psychiatric: He has a normal mood and affect. His behavior  is normal. Judgment and thought content normal.           Assessment & Plan:   Preventative care- discussed continuing healthy diet, exercise and weight loss. Obtain routine lab work. Discussed importance of smoking cessation.       Assessment & Plan:  EKG tracing is personally reviewed.  EKG notes  NSR.  No acute changes.

## 2017-11-13 NOTE — Patient Instructions (Addendum)
Please schedule  Routine eye exam. Continue your work on healthy diet, exercise and weight loss. Please work on quitting smoking.

## 2018-01-02 ENCOUNTER — Encounter: Payer: Self-pay | Admitting: Family Medicine

## 2018-03-07 ENCOUNTER — Encounter: Payer: Self-pay | Admitting: Family

## 2018-04-16 ENCOUNTER — Encounter: Payer: Self-pay | Admitting: Family

## 2018-04-16 ENCOUNTER — Ambulatory Visit: Payer: BLUE CROSS/BLUE SHIELD | Admitting: Family

## 2018-04-16 VITALS — BP 115/79 | HR 81 | Temp 98.7°F | Resp 16 | Ht 73.0 in | Wt 226.4 lb

## 2018-04-16 DIAGNOSIS — Z720 Tobacco use: Secondary | ICD-10-CM

## 2018-04-16 MED ORDER — VARENICLINE TARTRATE 1 MG PO TABS: 1.0000 mg | ORAL_TABLET | Freq: Two times a day (BID) | ORAL | 0 refills | Status: DC

## 2018-04-16 MED ORDER — VARENICLINE TARTRATE 0.5 MG X 11 & 1 MG X 42 PO MISC: ORAL | 0 refills | Status: DC

## 2018-04-16 NOTE — Patient Instructions (Signed)
Start chantix. Quit smoking 1 week after you start chantix. Call if you develop depression symptoms while on chantix.

## 2018-04-16 NOTE — Progress Notes (Signed)
Subjective:    Patient ID: Ayesha Mohair, male    DOB: [birth year], 42 y.o.   MRN: [id number]  HPI   Patient is a 42 year old male who presents today to discuss tobacco cessation.  He is currently smoking a little bit more than 1 pack of cigarettes per day.  He has tried Chantix twice in the past.  The first time he used Chantix he quit successfully for 2 years.  The second time that he tried Chantix he admits that he was not motivated to quit.  He was only successful in quitting for a few days despite taking the Chantix for a total of 2 months.  He reports that he is now motivated to quit smoking.    Review of Systems See HPI  Past Medical History:  Diagnosis Date  . Abdominal pain, other specified site   . Backache, unspecified   . Fatty liver   . Gastroparesis   . GERD (gastroesophageal reflux disease)   . Hiatal hernia   . Kidney stone   . Nonspecific elevation of levels of transaminase or lactic acid dehydrogenase (LDH)      Social History   Socioeconomic History  . Marital status: Married    Spouse name: Not on file  . Number of children: 2  . Years of education: Not on file  . Highest education level: Not on file  Occupational History  . Occupation: Film/video editor: [employer]  Social Needs  . Financial resource strain: Not on file  . Food insecurity:    Worry: Not on file    Inability: Not on file  . Transportation needs:    Medical: Not on file    Non-medical: Not on file  Tobacco Use  . Smoking status: Current Every Day Smoker    Packs/day: 1.00    Years: 30.00    Pack years: 30.00    Types: Cigarettes    Start date: 04/13/2017  . Smokeless tobacco: Former Neurosurgeon    Types: Chew  Substance and Sexual Activity  . Alcohol use: Yes    Alcohol/week: 1.0 standard drinks    Types: 1 Cans of beer per week    Comment: occasional  . Drug use: No  . Sexual activity: Not on file  Lifestyle  . Physical activity:    Days per week:  Not on file    Minutes per session: Not on file  . Stress: Not on file  Relationships  . Social connections:    Talks on phone: Not on file    Gets together: Not on file    Attends religious service: Not on file    Active member of club or organization: Not on file    Attends meetings of clubs or organizations: Not on file    Relationship status: Not on file  . Intimate partner violence:    Fear of current or ex partner: Not on file    Emotionally abused: Not on file    Physically abused: Not on file    Forced sexual activity: Not on file  Other Topics Concern  . 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   Daughter-2013   Son- 2009   Married   Enjoys playing golf   Completed 12th grade   No DWI.     Past Surgical History:  Procedure Laterality Date  . lasix eye surgery Bilateral   .  TONSILLECTOMY      Family History  Problem Relation Age of Onset  . Liver disease Mother        fatty liver- she is overweight  . Heart disease Paternal Aunt   . Colon cancer Neg Hx   . Colon polyps Neg Hx   . Diabetes Neg Hx   . Kidney disease Neg Hx   . Gallbladder disease Neg Hx   . Esophageal cancer Neg Hx     No Known Allergies  Current Outpatient Medications on File Prior to Visit  Medication Sig Dispense Refill  . Multiple Vitamin (DAILY VITAMIN PO) Take 1 packet by mouth daily. MEN'S SPORT VITAMIN PACK.  Omega 3, Grape Seed Extract, Milk Thistle Extract, Tribulus Terrestris, Vit D3.    . Omega-3 Fatty Acids (FISH OIL PO) Take 1 capsule by mouth daily.    . pantoprazole (PROTONIX) 40 MG tablet Take 1 tablet (40 mg total) by mouth 2 (two) times daily. 180 tablet 3  . Probiotic Product (PROBIOTIC DAILY) CAPS Take by mouth daily.    Marland Kitchen Specialty Vitamins Products (ULTIMATE FAT BURNER PO) Take 1 tablet by mouth daily.     No current facility-administered medications on file prior to visit.     BP 115/79 (BP Location:  Right Arm, Cuff Size: Large)   Pulse 81   Temp 98.7 F (37.1 C) (Oral)   Resp 16   Ht 6\' 1"  (1.854 m)   Wt 226 lb 6.4 oz (102.7 kg)   SpO2 99%   BMI 29.87 kg/m       Objective:   Physical Exam  Constitutional: He is oriented to person, place, and time. He appears well-developed and well-nourished. No distress.  HENT:  Head: Normocephalic and atraumatic.  Neurological: He is alert and oriented to person, place, and time.  Psychiatric: He has a normal mood and affect. His behavior is normal. Thought content normal.          Assessment & Plan:  Tobacco abuse- I suggested that he retry Chantix now that he is motivated to quit.  I think he is more likely to have success on the Chantix and he is the Wellbutrin.  We did discuss that if he is unsuccessful on Chantix we could then try Wellbutrin.  He is agreeable to this plan.  He denies previous history of depression symptoms on Chantix but he understands to give [place] a call should this occur.  I gave him a printed copy of the continuation month to fill after he completes the starter month.  I will plan to see him back in about 6 weeks for follow-up.   A total of 15  minutes were spent face-to-face with the patient during this encounter and over half of that time was spent on counseling and coordination of care. The patient was counseled on tobacco cessation.

## 2018-05-02 ENCOUNTER — Telehealth: Payer: Self-pay | Admitting: Family

## 2018-05-02 NOTE — Telephone Encounter (Signed)
Patient has been disputing 11-13-17 CPE Lab charges. Coding for this visit is valid. Patient asked that I call his insurance to see if codes were covered under preventative. Insurance states the procedures were not covered with the cpe dx.   Patient wants to know why he was asked to do the following procedures if they are not covered under CPE visit.:  93000 EKG $78.00 36415 Collection $12.00 81001 Urinalysis $9.00 80050 General Health $50.00 82248 Bilirubin $12.00  Please advise.

## 2018-05-02 NOTE — Telephone Encounter (Signed)
Those are tests that I order on all physicals for patients his age. Most insurance covers those tests, no problem. Not sure why his is choosing not to cover.

## 2018-05-25 ENCOUNTER — Encounter: Payer: Self-pay | Admitting: Family

## 2018-05-26 MED ORDER — VARENICLINE TARTRATE 1 MG PO TABS: 1.0000 mg | ORAL_TABLET | Freq: Two times a day (BID) | ORAL | 1 refills | Status: DC

## 2018-08-19 ENCOUNTER — Telehealth: Payer: Self-pay

## 2018-08-19 NOTE — Telephone Encounter (Signed)
PA initiated via Covermymeds; KEY: [id number]. Awaiting determination.

## 2018-08-20 NOTE — Telephone Encounter (Signed)
PA approved. Effective 08/19/2018 to 08/20/2019.

## 2018-11-10 DIAGNOSIS — L720 Epidermal cyst: Secondary | ICD-10-CM | POA: Diagnosis not present

## 2019-03-26 ENCOUNTER — Other Ambulatory Visit: Payer: Self-pay | Admitting: Family

## 2019-04-02 ENCOUNTER — Other Ambulatory Visit: Payer: Self-pay

## 2019-04-03 ENCOUNTER — Encounter: Payer: Self-pay | Admitting: Family

## 2019-04-03 ENCOUNTER — Ambulatory Visit (INDEPENDENT_AMBULATORY_CARE_PROVIDER_SITE_OTHER): Payer: Managed Care, Other (non HMO) | Admitting: Family

## 2019-04-03 VITALS — BP 114/79 | HR 84 | Temp 97.6°F | Resp 16 | Ht 73.0 in | Wt 213.0 lb

## 2019-04-03 DIAGNOSIS — Z0000 Encounter for general adult medical examination without abnormal findings: Secondary | ICD-10-CM | POA: Diagnosis not present

## 2019-04-03 LAB — CBC WITH DIFFERENTIAL/PLATELET
Basophils Absolute: 0.1 10*3/uL (ref 0.0–0.1)
Basophils Relative: 1.1 % (ref 0.0–3.0)
Eosinophils Absolute: 0.2 10*3/uL (ref 0.0–0.7)
Eosinophils Relative: 2.2 % (ref 0.0–5.0)
HCT: 44.2 % (ref 39.0–52.0)
Hemoglobin: 15.4 g/dL (ref 13.0–17.0)
Lymphocytes Relative: 29.1 % (ref 12.0–46.0)
Lymphs Abs: 2.5 10*3/uL (ref 0.7–4.0)
MCHC: 34.9 g/dL (ref 30.0–36.0)
MCV: 84.4 fl (ref 78.0–100.0)
Monocytes Absolute: 0.7 10*3/uL (ref 0.1–1.0)
Monocytes Relative: 8.5 % (ref 3.0–12.0)
Neutro Abs: 5 10*3/uL (ref 1.4–7.7)
Neutrophils Relative %: 59.1 % (ref 43.0–77.0)
Platelets: 221 10*3/uL (ref 150.0–400.0)
RBC: 5.23 Mil/uL (ref 4.22–5.81)
RDW: 13.3 % (ref 11.5–15.5)
WBC: 8.5 10*3/uL (ref 4.0–10.5)

## 2019-04-03 LAB — LIPID PANEL
Cholesterol: 252 mg/dL — ABNORMAL HIGH (ref 0–200)
HDL: 34.5 mg/dL — ABNORMAL LOW (ref >39.00)
NonHDL: 217.53
Total CHOL/HDL Ratio: 7
Triglycerides: 399 mg/dL — ABNORMAL HIGH (ref 0.0–149.0)
VLDL: 79.8 mg/dL — ABNORMAL HIGH (ref 0.0–40.0)

## 2019-04-03 LAB — BASIC METABOLIC PANEL
BUN: 19 mg/dL (ref 6–23)
CO2: 26 mEq/L (ref 19–32)
Calcium: 10.1 mg/dL (ref 8.4–10.5)
Chloride: 103 mEq/L (ref 96–112)
Creatinine, Ser: 1.06 mg/dL (ref 0.40–1.50)
GFR: 76.18 mL/min (ref >60.00)
Glucose, Bld: 81 mg/dL (ref 70–99)
Potassium: 3.9 mEq/L (ref 3.5–5.1)
Sodium: 136 mEq/L (ref 135–145)

## 2019-04-03 LAB — HEPATIC FUNCTION PANEL
ALT: 29 U/L (ref 0–53)
AST: 21 U/L (ref 0–37)
Albumin: 5 g/dL (ref 3.5–5.2)
Alkaline Phosphatase: 70 U/L (ref 39–117)
Bilirubin, Direct: 0.1 mg/dL (ref 0.0–0.3)
Total Bilirubin: 0.4 mg/dL (ref 0.2–1.2)
Total Protein: 7.6 g/dL (ref 6.0–8.3)

## 2019-04-03 LAB — LDL CHOLESTEROL, DIRECT: Direct LDL: 172 mg/dL

## 2019-04-03 LAB — TSH: TSH: 1.15 u[IU]/mL (ref 0.35–4.50)

## 2019-04-03 MED ORDER — PANTOPRAZOLE SODIUM 40 MG PO TBEC: 40.0000 mg | DELAYED_RELEASE_TABLET | Freq: Every day | ORAL | 1 refills | Status: DC

## 2019-04-03 NOTE — Progress Notes (Signed)
Subjective:    Patient ID: Gregory Fowler, male    DOB: 1975-08-21, 43 y.o.   MRN: [id number]  HPI   Patient presents today for complete physical.  Immunizations: tdap 2016 Diet:  Reports that he has been trying to lose weight Wt Readings from Last 3 Encounters:  04/03/19 213 lb (96.6 kg)  04/16/18 226 lb 6.4 oz (102.7 kg)  11/13/17 229 lb (103.9 kg)  Exercise: goes to [employer] Tobacco abuse- reports that he quit twice (once for a few years and another time for 2 years). Unable to quit the third time he quit with chantix.  Review of Systems  Constitutional: Negative for unexpected weight change.  HENT: Negative for hearing loss (feels like his hearing is not as good it used to be, declines audiology referral) and rhinorrhea.   Eyes: Negative for visual disturbance.  Respiratory: Negative for cough and shortness of breath.   Cardiovascular: Negative for chest pain.  Gastrointestinal:       Reports gerd symptoms have been stable on protonix once daily   Genitourinary: Negative for dysuria, frequency and hematuria.  Musculoskeletal: Negative for arthralgias and myalgias.  Skin: Negative for rash.  Neurological: Negative for headaches.  Hematological: Negative for adenopathy.  Psychiatric/Behavioral:       Denies depression/anxiety       Past Medical History:  Diagnosis Date  . Abdominal pain, other specified site   . Backache, unspecified   . Fatty liver   . Gastroparesis   . GERD (gastroesophageal reflux disease)   . Hiatal hernia   . Kidney stone   . Nonspecific elevation of levels of transaminase or lactic acid dehydrogenase (LDH)      Social History   Socioeconomic History  . Marital status: Married    Spouse name: Not on file  . Number of children: 2  . Years of education: Not on file  . Highest education level: Not on file  Occupational History  . Occupation: Comptroller: [employer]  . Financial resource  strain: Not on file  . Food insecurity    Worry: Not on file    Inability: Not on file  . Transportation needs    Medical: Not on file    Non-medical: Not on file  Tobacco Use  . Smoking status: Current Every Day Smoker    Packs/day: 1.00    Years: 30.00    Pack years: 30.00    Types: Cigarettes    Start date: 04/13/2017  . Smokeless tobacco: Former Systems developer    Types: Chew  Substance and Sexual Activity  . Alcohol use: Yes    Alcohol/week: 1.0 standard drinks    Types: 1 Cans of beer per week    Comment: occasional  . Drug use: No  . Sexual activity: Not on file  Lifestyle  . Physical activity    Days per week: Not on file    Minutes per session: Not on file  . Stress: Not on file  Relationships  . Social Herbalist on phone: Not on file    Gets together: Not on file    Attends religious service: Not on file    Active member of club or organization: Not on file    Attends meetings of clubs or organizations: Not on file    Relationship status: Not on file  . Intimate partner violence    Fear of current or ex partner: Not on file  Emotionally abused: Not on file    Physically abused: Not on file    Forced sexual activity: Not on file  Other Topics Concern  . 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   Daughter-2013   Son- 2009   Married   Enjoys playing golf   Completed 12th grade   No DWI.     Past Surgical History:  Procedure Laterality Date  . lasix eye surgery Bilateral   . TONSILLECTOMY      Family History  Problem Relation Age of Onset  . Liver disease Mother        fatty liver- she is overweight  . Heart disease Paternal Aunt   . Colon cancer Neg Hx   . Colon polyps Neg Hx   . Diabetes Neg Hx   . Kidney disease Neg Hx   . Gallbladder disease Neg Hx   . Esophageal cancer Neg Hx     No Known Allergies  Current Outpatient Medications on File Prior to Visit  Medication Sig  Dispense Refill  . Multiple Vitamin (DAILY VITAMIN PO) Take 1 packet by mouth daily. MEN'S SPORT VITAMIN PACK.  Omega 3, Grape Seed Extract, Milk Thistle Extract, Tribulus Terrestris, Vit D3.    . Omega-3 Fatty Acids (FISH OIL PO) Take 1 capsule by mouth daily.    . Probiotic Product (PROBIOTIC DAILY) CAPS Take by mouth daily.    Marland Kitchen. Specialty Vitamins Products (ULTIMATE FAT BURNER PO) Take 1 tablet by mouth daily.     No current facility-administered medications on file prior to visit.     BP 114/79 (BP Location: Left Arm, Patient Position: Sitting, Cuff Size: Large)   Pulse 84   Temp 97.6 F (36.4 C) (Temporal)   Resp 16   Ht 6\' 1"  (1.854 m)   Wt 213 lb (96.6 kg)   SpO2 99%   BMI 28.10 kg/m    Objective:   Physical Exam Physical Exam  Constitutional: He is oriented to person, place, and time. He appears well-developed and well-nourished. No distress.  HENT:  Head: Normocephalic and atraumatic.  Right Ear: Tympanic membrane and ear canal normal.  Left Ear: Tympanic membrane and ear canal normal.  Mouth/Throat: Oropharynx is clear and moist.  Eyes: Pupils are equal, round, and reactive to light. No scleral icterus.  Neck: Normal range of motion. No thyromegaly present.  Cardiovascular: Normal rate and regular rhythm.   No murmur heard. Pulmonary/Chest: Effort normal and breath sounds normal. No respiratory distress. He has no wheezes. He has no rales. He exhibits no tenderness.  Abdominal: Soft. Bowel sounds are normal. He exhibits no distension and no mass. There is no tenderness. There is no rebound and no guarding.  Musculoskeletal: He exhibits no edema.  Lymphadenopathy:    He has no cervical adenopathy.  Neurological: He is alert and oriented to person, place, and time. He has normal patellar reflexes. He exhibits normal muscle tone. Coordination normal.  Skin: Skin is warm and dry.  Psychiatric: He has a normal mood and affect. His behavior is normal. Judgment and thought  content normal.           Assessment & Plan:   Preventative care- encouraged pt to continue healthy diet, exercise and weight loss efforts. Obtain routine lab work. Advised pt to get a flu shot this fall.        Assessment & Plan:

## 2019-04-03 NOTE — Patient Instructions (Signed)
Please complete lab work prior to leaving.   

## 2019-04-06 ENCOUNTER — Other Ambulatory Visit: Payer: Self-pay | Admitting: Family

## 2019-04-06 ENCOUNTER — Other Ambulatory Visit: Payer: Self-pay

## 2019-04-06 DIAGNOSIS — E785 Hyperlipidemia, unspecified: Secondary | ICD-10-CM

## 2019-04-06 MED ORDER — ATORVASTATIN CALCIUM 20 MG PO TABS: 20.0000 mg | ORAL_TABLET | Freq: Every day | ORAL | 3 refills | Status: DC

## 2019-04-06 NOTE — Telephone Encounter (Signed)
Please advise pt that his cholesterol and triglycerides are quite elevated.  I would recommend that he work on low fat/low cholesterol diet as well as avoiding concentrated sweets/white carbs such as white bread, potatoes and rice. Instead substitute reasonable portions of whole grains.  I would also recommend that he add atorvastatin since his numbers are so high. I have pended the rx.  If he is agreeable to start then he should repeat fasting lipids in 6 weeks.

## 2019-04-06 NOTE — Progress Notes (Signed)
.  li

## 2019-04-06 NOTE — Telephone Encounter (Signed)
Results explained to patient, he verbalized understanding and will start low cholesterol diet. He will start medication as well. Appointment was scheduled for 05-20-19 for repeat lipid profile

## 2019-04-09 ENCOUNTER — Encounter: Payer: Self-pay | Admitting: Family

## 2019-04-16 ENCOUNTER — Ambulatory Visit (HOSPITAL_BASED_OUTPATIENT_CLINIC_OR_DEPARTMENT_OTHER)
Admission: RE | Admit: 2019-04-16 | Discharge: 2019-04-16 | Disposition: A | Discharge status: Home / Self Care | Payer: Managed Care, Other (non HMO) | Source: Ambulatory Visit | Attending: Internal Medicine | Admitting: Internal Medicine

## 2019-04-16 ENCOUNTER — Other Ambulatory Visit: Payer: Self-pay

## 2019-04-16 ENCOUNTER — Ambulatory Visit (INDEPENDENT_AMBULATORY_CARE_PROVIDER_SITE_OTHER): Payer: Managed Care, Other (non HMO) | Admitting: Internal Medicine

## 2019-04-16 ENCOUNTER — Encounter: Payer: Self-pay | Admitting: Internal Medicine

## 2019-04-16 VITALS — BP 108/63 | HR 97 | Temp 98.1°F | Resp 16 | Ht 73.0 in | Wt 213.2 lb

## 2019-04-16 DIAGNOSIS — M533 Sacrococcygeal disorders, not elsewhere classified: Secondary | ICD-10-CM | POA: Diagnosis not present

## 2019-04-16 DIAGNOSIS — K611 Rectal abscess: Secondary | ICD-10-CM | POA: Diagnosis not present

## 2019-04-16 NOTE — Progress Notes (Signed)
Subjective:    Patient ID: Gregory Fowler, male    DOB: 01-08-1976, 43 y.o.   MRN: [id number]  DOS:  04/16/2019 Type of visit - description: Acute Symptoms started approximately 3 days ago: Having pain on the buttocks right around the anus. He has not been constipated, BMs are normal without pain or bleeding. No rectal itching. He denies any injury or fall. Pain is worse when he walks or sit down   Review of Systems Denies nausea, vomiting.  No abdominal pain per se No fever chills Occasionally he gets lightheaded.   Past Medical History:  Diagnosis Date  . Abdominal pain, other specified site   . Backache, unspecified   . Fatty liver   . Gastroparesis   . GERD (gastroesophageal reflux disease)   . Hiatal hernia   . Kidney stone   . Nonspecific elevation of levels of transaminase or lactic acid dehydrogenase (LDH)     Past Surgical History:  Procedure Laterality Date  . lasix eye surgery Bilateral   . TONSILLECTOMY      Social History   Socioeconomic History  . Marital status: Married    Spouse name: Not on file  . Number of children: 2  . Years of education: Not on file  . Highest education level: Not on file  Occupational History  . Occupation: Comptroller: [employer]  . Financial resource strain: Not on file  . Food insecurity    Worry: Not on file    Inability: Not on file  . Transportation needs    Medical: Not on file    Non-medical: Not on file  Tobacco Use  . Smoking status: Current Every Day Smoker    Packs/day: 1.00    Years: 30.00    Pack years: 30.00    Types: Cigarettes    Start date: 04/13/2017  . Smokeless tobacco: Former Systems developer    Types: Chew  Substance and Sexual Activity  . Alcohol use: Yes    Alcohol/week: 1.0 standard drinks    Types: 1 Cans of beer per week    Comment: occasional  . Drug use: No  . Sexual activity: Not on file  Lifestyle  . Physical activity    Days per week: Not  on file    Minutes per session: Not on file  . Stress: Not on file  Relationships  . Social Herbalist on phone: Not on file    Gets together: Not on file    Attends religious service: Not on file    Active member of club or organization: Not on file    Attends meetings of clubs or organizations: Not on file    Relationship status: Not on file  . Intimate partner violence    Fear of current or ex partner: Not on file    Emotionally abused: Not on file    Physically abused: Not on file    Forced sexual activity: Not on file  Other Topics Concern  . 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   Daughter-2013   Son- 2009   Married   Enjoys playing golf   Completed 12th grade   No DWI.       Allergies as of 04/16/2019   No Known Allergies     Medication List       Accurate as of April 16, 2019 11:59 PM. If you have any questions, ask your nurse or doctor.        atorvastatin 20 MG tablet Commonly known as: LIPITOR Take 1 tablet (20 mg total) by mouth daily.   co-enzyme Q-10 30 MG capsule Take 30 mg by mouth 3 (three) times daily.   DAILY VITAMIN PO Take 1 packet by mouth daily. MEN'S SPORT VITAMIN PACK.  Omega 3, Grape Seed Extract, Milk Thistle Extract, Tribulus Terrestris, Vit D3.   FISH OIL PO Take 1 capsule by mouth daily.   pantoprazole 40 MG tablet Commonly known as: PROTONIX Take 1 tablet (40 mg total) by mouth daily.   Probiotic Daily Caps Take by mouth daily.   ULTIMATE FAT BURNER PO Take 1 tablet by mouth daily.           Objective:   Physical Exam Skin:        BP 108/63 (BP Location: Left Arm, Patient Position: Sitting, Cuff Size: Small)   Pulse 97   Temp 98.1 F (36.7 C) (Temporal)   Resp 16   Ht 6\' 1"  (1.854 m)   Wt 213 lb 4 oz (96.7 kg)   SpO2 97%   BMI 28.13 kg/m   General:   Well developed, NAD, BMI noted.  HEENT:  Normocephalic . Face symmetric,  atraumatic  Abdomen:  Not distended, soft, non-tender. No rebound or rigidity.   Skin: Mild redness and maceration between the buttocks, no evidence of cellulitis or an abscess (no warmness, swelling) DRE: Normal sphincter tone, prostate normal, the DRE itself is really not causing pain.  No mass Anoscopy: + Internal hemorrhoids without active bleeding. MSK: TTP at the buttocks, see graphic Neurologic:  alert & oriented X3.  Speech normal, gait and posture somewhat antalgic because the pain increased by walking or sitting down Psych--  Cognition and judgment appear intact.  Cooperative with normal attention span and concentration.  Behavior appropriate. No anxious or depressed appearing.     Assessment    [age] year old male, PMH includes high cholesterol, fatty liver, GERD, kidney stones, presents with   Sacral pain: There is no evidence of  a rectal abscess, perianal cellulitis, thrombosed  hemorrhoids. He does have   fungal skin infection and recommend to use over-the-counter antifungals.. The pain however seems to be MSK by description despite the fact that he did not sustain a fall.  He does do heavy lifting at work. Plan:  X-rays of the lumbosacral area, Tylenol, ibuprofen, see AVS. Call if not better, call if fever, chills or swelling All of the above was carefully discussed with the patient who verbalized understanding

## 2019-04-16 NOTE — Patient Instructions (Signed)
Please get the x-rays downstairs  For pain:  Ice pack twice a day  Alternate Tylenol and ibuprofen, see below   Tylenol  500 mg OTC 2 tabs a day every 8 hours as needed for pain  IBUPROFEN (Advil or Motrin) 200 mg 2 tablets every 8  hours as needed for pain.  Always take it with food because may cause gastritis and ulcers.  If you notice nausea, stomach pain, change in the color of stools --->  Stop the medicine and let us know

## 2019-04-16 NOTE — Progress Notes (Signed)
Pre visit review using our clinic review tool, if applicable. No additional management support is needed unless otherwise documented below in the visit note. 

## 2019-04-17 ENCOUNTER — Emergency Department (HOSPITAL_BASED_OUTPATIENT_CLINIC_OR_DEPARTMENT_OTHER): Payer: Managed Care, Other (non HMO)

## 2019-04-17 ENCOUNTER — Encounter: Payer: Self-pay | Admitting: Internal Medicine

## 2019-04-17 ENCOUNTER — Other Ambulatory Visit: Payer: Self-pay

## 2019-04-17 ENCOUNTER — Encounter (HOSPITAL_BASED_OUTPATIENT_CLINIC_OR_DEPARTMENT_OTHER): Payer: Self-pay | Admitting: Adult Health

## 2019-04-17 ENCOUNTER — Inpatient Hospital Stay (HOSPITAL_BASED_OUTPATIENT_CLINIC_OR_DEPARTMENT_OTHER)
Admission: EM | Admit: 2019-04-17 | Discharge: 2019-04-19 | DRG: 346 | Disposition: A | Discharge status: Home / Self Care | Payer: Managed Care, Other (non HMO) | Attending: Surgery | Admitting: Surgery

## 2019-04-17 DIAGNOSIS — K760 Fatty (change of) liver, not elsewhere classified: Secondary | ICD-10-CM | POA: Diagnosis present

## 2019-04-17 DIAGNOSIS — Z20828 Contact with and (suspected) exposure to other viral communicable diseases: Secondary | ICD-10-CM | POA: Diagnosis not present

## 2019-04-17 DIAGNOSIS — Z6827 Body mass index (BMI) 27.0-27.9, adult: Secondary | ICD-10-CM

## 2019-04-17 DIAGNOSIS — F17210 Nicotine dependence, cigarettes, uncomplicated: Secondary | ICD-10-CM | POA: Diagnosis not present

## 2019-04-17 DIAGNOSIS — K449 Diaphragmatic hernia without obstruction or gangrene: Secondary | ICD-10-CM | POA: Diagnosis present

## 2019-04-17 DIAGNOSIS — Z79899 Other long term (current) drug therapy: Secondary | ICD-10-CM | POA: Diagnosis not present

## 2019-04-17 DIAGNOSIS — Z8249 Family history of ischemic heart disease and other diseases of the circulatory system: Secondary | ICD-10-CM | POA: Diagnosis not present

## 2019-04-17 DIAGNOSIS — E663 Overweight: Secondary | ICD-10-CM | POA: Diagnosis not present

## 2019-04-17 DIAGNOSIS — K219 Gastro-esophageal reflux disease without esophagitis: Secondary | ICD-10-CM | POA: Diagnosis present

## 2019-04-17 DIAGNOSIS — K611 Rectal abscess: Principal | ICD-10-CM | POA: Diagnosis present

## 2019-04-17 LAB — CBC WITH DIFFERENTIAL/PLATELET
Abs Immature Granulocytes: 0.04 10*3/uL (ref 0.00–0.07)
Basophils Absolute: 0 10*3/uL (ref 0.0–0.1)
Basophils Relative: 0 %
Eosinophils Absolute: 0.1 10*3/uL (ref 0.0–0.5)
Eosinophils Relative: 1 %
HCT: 38.9 % — ABNORMAL LOW (ref 39.0–52.0)
Hemoglobin: 13.2 g/dL (ref 13.0–17.0)
Immature Granulocytes: 0 %
Lymphocytes Relative: 10 %
Lymphs Abs: 1.2 10*3/uL (ref 0.7–4.0)
MCH: 29 pg (ref 26.0–34.0)
MCHC: 33.9 g/dL (ref 30.0–36.0)
MCV: 85.5 fL (ref 80.0–100.0)
Monocytes Absolute: 0.7 10*3/uL (ref 0.1–1.0)
Monocytes Relative: 6 %
Neutro Abs: 9.5 10*3/uL — ABNORMAL HIGH (ref 1.7–7.7)
Neutrophils Relative %: 83 %
Platelets: 205 10*3/uL (ref 150–400)
RBC: 4.55 MIL/uL (ref 4.22–5.81)
RDW: 12.2 % (ref 11.5–15.5)
WBC: 11.5 10*3/uL — ABNORMAL HIGH (ref 4.0–10.5)
nRBC: 0 % (ref 0.0–0.2)

## 2019-04-17 LAB — COMPREHENSIVE METABOLIC PANEL
ALT: 47 U/L — ABNORMAL HIGH (ref 0–44)
AST: 35 U/L (ref 15–41)
Albumin: 3.8 g/dL (ref 3.5–5.0)
Alkaline Phosphatase: 74 U/L (ref 38–126)
Anion gap: 12 (ref 5–15)
BUN: 14 mg/dL (ref 6–20)
CO2: 23 mmol/L (ref 22–32)
Calcium: 9 mg/dL (ref 8.9–10.3)
Chloride: 101 mmol/L (ref 98–111)
Creatinine, Ser: 1.02 mg/dL (ref 0.61–1.24)
GFR calc Af Amer: >60 mL/min (ref >60)
GFR calc non Af Amer: >60 mL/min (ref >60)
Glucose, Bld: 141 mg/dL — ABNORMAL HIGH (ref 70–99)
Potassium: 3.4 mmol/L — ABNORMAL LOW (ref 3.5–5.1)
Sodium: 136 mmol/L (ref 135–145)
Total Bilirubin: 0.5 mg/dL (ref 0.3–1.2)
Total Protein: 7.1 g/dL (ref 6.5–8.1)

## 2019-04-17 LAB — PROTIME-INR
INR: 1 (ref 0.8–1.2)
Prothrombin Time: 13.2 seconds (ref 11.4–15.2)

## 2019-04-17 LAB — LACTIC ACID, PLASMA: Lactic Acid, Venous: 1.9 mmol/L (ref 0.5–1.9)

## 2019-04-17 LAB — APTT: aPTT: 31 seconds (ref 24–36)

## 2019-04-17 LAB — SARS CORONAVIRUS 2 BY RT PCR (HOSPITAL ORDER, PERFORMED IN ~~LOC~~ HOSPITAL LAB): SARS Coronavirus 2: NEGATIVE

## 2019-04-17 MED ORDER — FENTANYL CITRATE (PF) 100 MCG/2ML IJ SOLN
50.0000 ug | Freq: Once | INTRAMUSCULAR | Status: AC
  Administered 2019-04-17: 50 ug via INTRAVENOUS
  Filled 2019-04-17: qty 2

## 2019-04-17 MED ORDER — IOHEXOL 300 MG/ML  SOLN
100.0000 mL | Freq: Once | INTRAMUSCULAR | Status: AC | PRN
  Administered 2019-04-17: 100 mL via INTRAVENOUS

## 2019-04-17 MED ORDER — SODIUM CHLORIDE 0.9 % IV SOLN
INTRAVENOUS | Status: DC | PRN
  Administered 2019-04-17: 500 mL via INTRAVENOUS

## 2019-04-17 MED ORDER — ONDANSETRON HCL 4 MG/2ML IJ SOLN
4.0000 mg | Freq: Once | INTRAMUSCULAR | Status: AC
  Administered 2019-04-17: 4 mg via INTRAVENOUS
  Filled 2019-04-17: qty 2

## 2019-04-17 MED ORDER — SODIUM CHLORIDE 0.9% FLUSH
3.0000 mL | Freq: Once | INTRAVENOUS | Status: DC
  Filled 2019-04-17: qty 3

## 2019-04-17 MED ORDER — SODIUM CHLORIDE 0.9 % IV SOLN
3.0000 g | Freq: Four times a day (QID) | INTRAVENOUS | Status: DC
  Administered 2019-04-18: 3 g via INTRAVENOUS
  Filled 2019-04-17: qty 8
  Filled 2019-04-17: qty 3

## 2019-04-17 MED ORDER — METRONIDAZOLE IN NACL 5-0.79 MG/ML-% IV SOLN
500.0000 mg | Freq: Once | INTRAVENOUS | Status: AC
  Administered 2019-04-17: 500 mg via INTRAVENOUS
  Filled 2019-04-17: qty 100

## 2019-04-17 MED ORDER — KCL IN DEXTROSE-NACL 20-5-0.45 MEQ/L-%-% IV SOLN
INTRAVENOUS | Status: DC
  Administered 2019-04-18: 02:00:00 via INTRAVENOUS
  Filled 2019-04-17 (×2): qty 1000

## 2019-04-17 MED ORDER — ACETAMINOPHEN 325 MG PO TABS
650.0000 mg | ORAL_TABLET | Freq: Once | ORAL | Status: AC
  Administered 2019-04-17: 20:00:00 650 mg via ORAL
  Filled 2019-04-17: qty 2

## 2019-04-17 MED ORDER — ONDANSETRON 4 MG PO TBDP: 4.0000 mg | ORAL_TABLET | Freq: Four times a day (QID) | ORAL | Status: DC | PRN

## 2019-04-17 MED ORDER — HYDROCODONE-ACETAMINOPHEN 5-325 MG PO TABS: 1.0000 | ORAL_TABLET | ORAL | Status: DC | PRN

## 2019-04-17 MED ORDER — VANCOMYCIN HCL IN DEXTROSE 1-5 GM/200ML-% IV SOLN
1000.0000 mg | Freq: Once | INTRAVENOUS | Status: AC
  Administered 2019-04-17: 1000 mg via INTRAVENOUS
  Filled 2019-04-17: qty 200

## 2019-04-17 MED ORDER — ONDANSETRON HCL 4 MG/2ML IJ SOLN: 4.0000 mg | Freq: Four times a day (QID) | INTRAMUSCULAR | Status: DC | PRN

## 2019-04-17 MED ORDER — ACETAMINOPHEN 650 MG RE SUPP: 650.0000 mg | Freq: Four times a day (QID) | RECTAL | Status: DC | PRN

## 2019-04-17 MED ORDER — SODIUM CHLORIDE 0.9 % IV SOLN
2.0000 g | Freq: Once | INTRAVENOUS | Status: AC
  Administered 2019-04-17: 2 g via INTRAVENOUS
  Filled 2019-04-17: qty 2

## 2019-04-17 MED ORDER — SODIUM CHLORIDE 0.9 % IV SOLN
INTRAVENOUS | Status: DC | PRN
  Administered 2019-04-17: 1000 mL via INTRAVENOUS

## 2019-04-17 MED ORDER — HYDROMORPHONE HCL 1 MG/ML IJ SOLN
1.0000 mg | Freq: Once | INTRAMUSCULAR | Status: AC
  Administered 2019-04-18: 1 mg via INTRAVENOUS
  Filled 2019-04-17: qty 1

## 2019-04-17 MED ORDER — ACETAMINOPHEN 325 MG PO TABS
650.0000 mg | ORAL_TABLET | Freq: Four times a day (QID) | ORAL | Status: DC | PRN
  Administered 2019-04-18: 650 mg via ORAL
  Filled 2019-04-17: qty 2

## 2019-04-17 MED ORDER — HYDROMORPHONE HCL 1 MG/ML IJ SOLN
1.0000 mg | INTRAMUSCULAR | Status: DC | PRN
  Administered 2019-04-18: 1 mg via INTRAVENOUS
  Filled 2019-04-17: qty 1

## 2019-04-17 NOTE — ED Notes (Signed)
Pt on monitor 

## 2019-04-17 NOTE — ED Notes (Signed)
ED TO INPATIENT HANDOFF REPORT  ED Nurse Name and Phone #: Nehal Witting RN [phone]  S Name/Age/Gender Gregory Fowler 43 y.o. male Room/Bed: WA16/WA16  Code Status   Code Status: Full Code  Home/SNF/Other Home Patient oriented to: self, place, time and situation Is this baseline? Yes   Triage Complete: Triage complete  Chief Complaint RECTAL PAIN  Triage Note Presents with rectal pain That began Tuesday, he was seen by his primary care doctor yesterday and had blood work and x rays and told everything was fine. Last night he began running a fever and today the fever is 103.0. He reports that sometimes it hard to have a BM since the pain began.  He denies urinary symptoms and hematuria. He had a prostate exam yesterday and was told it was normal.  He received a flu shot on Sunday.    Allergies No Known Allergies  Level of Care/Admitting Diagnosis ED Disposition    ED Disposition Condition Comment   Admit  [facility] [[id number]]  Level of Care: Med-Surg [16]  Covid Evaluation: Confirmed COVID Negative  Diagnosis: Perirectal abscess [454098][171471]  Admitting Physician: CCS, MD [[id number]]  Attending Physician: CCS, MD [[id number]]  PT Class (Do Not Modify): Observation [104]  PT Acc Code (Do Not Modify): Observation [10022]       B Medical/Surgery History Past Medical History:  Diagnosis Date  . Abdominal pain, other specified site   . Backache, unspecified   . Fatty liver   . Gastroparesis   . GERD (gastroesophageal reflux disease)   . Hiatal hernia   . Kidney stone   . Nonspecific elevation of levels of transaminase or lactic acid dehydrogenase (LDH)    Past Surgical History:  Procedure Laterality Date  . lasix eye surgery Bilateral   . TONSILLECTOMY       A IV Location/Drains/Wounds Patient Lines/Drains/Airways Status   Active Line/Drains/Airways    Name:   Placement date:   Placement time:   Site:   Days:   Peripheral IV 04/17/19 Right  Antecubital   04/17/19    1855    Antecubital   less than 1   Peripheral IV 04/17/19 Left Antecubital   04/17/19    1910    Antecubital   less than 1          Intake/Output Last 24 hours  Intake/Output Summary (Last 24 hours) at 04/17/2019 2357 Last data filed at 04/17/2019 2133 Gross per 24 hour  Intake 398.45 ml  Output -  Net 398.45 ml    Labs/Imaging Results for orders placed or performed during the hospital encounter of 04/17/19 (from the past 48 hour(s))  Lactic acid, plasma     Status: None   Collection Time: 04/17/19  6:52 PM  Result Value Ref Range   Lactic Acid, Venous 1.9 0.5 - 1.9 mmol/L    Comment: Performed at [facility], [address] [place], [place] [id number]  Comprehensive metabolic panel     Status: Abnormal   Collection Time: 04/17/19  6:52 PM  Result Value Ref Range   Sodium 136 135 - 145 mmol/L   Potassium 3.4 (L) 3.5 - 5.1 mmol/L   Chloride 101 98 - 111 mmol/L   CO2 23 22 - 32 mmol/L   Glucose, Bld 141 (H) 70 - 99 mg/dL   BUN 14 6 - 20 mg/dL   Creatinine, Ser 7.821.02 0.61 - 1.24 mg/dL   Calcium 9.0 8.9 - 95.610.3 mg/dL  Total Protein 7.1 6.5 - 8.1 g/dL   Albumin 3.8 3.5 - 5.0 g/dL   AST 35 15 - 41 U/L   ALT 47 (H) 0 - 44 U/L   Alkaline Phosphatase 74 38 - 126 U/L   Total Bilirubin 0.5 0.3 - 1.2 mg/dL   GFR calc non Af Amer >60 >60 mL/min   GFR calc Af Amer >60 >60 mL/min   Anion gap 12 5 - 15    Comment: Performed at [facility], [address] [place], [place] [address]  CBC with Differential     Status: Abnormal   Collection Time: 04/17/19  6:52 PM  Result Value Ref Range   WBC 11.5 (H) 4.0 - 10.5 K/uL   RBC 4.55 4.22 - 5.81 MIL/uL   Hemoglobin 13.2 13.0 - 17.0 g/dL   HCT 01.0 (L) 27.2 - 53.6 %   MCV 85.5 80.0 - 100.0 fL   MCH 29.0 26.0 - 34.0 pg   MCHC 33.9 30.0 - 36.0 g/dL   RDW 64.4 03.4 - 74.2 %   Platelets 205 150 - 400 K/uL   nRBC 0.0 0.0 - 0.2 %   Neutrophils Relative % 83 %   Neutro Abs 9.5 (H) 1.7 - 7.7  K/uL   Lymphocytes Relative 10 %   Lymphs Abs 1.2 0.7 - 4.0 K/uL   Monocytes Relative 6 %   Monocytes Absolute 0.7 0.1 - 1.0 K/uL   Eosinophils Relative 1 %   Eosinophils Absolute 0.1 0.0 - 0.5 K/uL   Basophils Relative 0 %   Basophils Absolute 0.0 0.0 - 0.1 K/uL   Immature Granulocytes 0 %   Abs Immature Granulocytes 0.04 0.00 - 0.07 K/uL    Comment: Performed at [facility], [address] [place], [place] [address]  APTT     Status: None   Collection Time: 04/17/19  6:52 PM  Result Value Ref Range   aPTT 31 24 - 36 seconds    Comment: Performed at [facility], [address] [place], [place] [address]  Protime-INR     Status: None   Collection Time: 04/17/19  6:52 PM  Result Value Ref Range   Prothrombin Time 13.2 11.4 - 15.2 seconds   INR 1.0 0.8 - 1.2    Comment: (NOTE) INR goal varies based on device and disease states. Performed at [facility], [address] [place], [place] [address]   SARS Coronavirus 2 [facility] order, Performed in [facility] hospital lab) Nasopharyngeal Nasopharyngeal Swab     Status: None   Collection Time: 04/17/19  7:41 PM   Specimen: Nasopharyngeal Swab  Result Value Ref Range   SARS Coronavirus 2 NEGATIVE NEGATIVE    Comment: (NOTE) If result is NEGATIVE SARS-CoV-2 target nucleic acids are NOT DETECTED. The SARS-CoV-2 RNA is generally detectable in upper and lower  respiratory specimens during the acute phase of infection. The lowest  concentration of SARS-CoV-2 viral copies this assay can detect is 250  copies / mL. A negative result does not preclude SARS-CoV-2 infection  and should not be used as the sole basis for treatment or other  patient management decisions.  A negative result may occur with  improper specimen collection / handling, submission of specimen other  than nasopharyngeal swab, presence of viral mutation(s) within the  areas targeted by this assay, and inadequate number of viral  copies  (<250 copies / mL). A negative result must be  combined with clinical  observations, patient history, and epidemiological information. If result is POSITIVE SARS-CoV-2 target nucleic acids are DETECTED. The SARS-CoV-2 RNA is generally detectable in upper and lower  respiratory specimens dur ing the acute phase of infection.  Positive  results are indicative of active infection with SARS-CoV-2.  Clinical  correlation with patient history and other diagnostic information is  necessary to determine patient infection status.  Positive results do  not rule out bacterial infection or co-infection with other viruses. If result is PRESUMPTIVE POSTIVE SARS-CoV-2 nucleic acids MAY BE PRESENT.   A presumptive positive result was obtained on the submitted specimen  and confirmed on repeat testing.  While 2019 novel coronavirus  (SARS-CoV-2) nucleic acids may be present in the submitted sample  additional confirmatory testing may be necessary for epidemiological  and / or clinical management purposes  to differentiate between  SARS-CoV-2 and other Sarbecovirus currently known to infect humans.  If clinically indicated additional testing with an alternate test  methodology [phone]) is advised. The SARS-CoV-2 RNA is generally  detectable in upper and lower respiratory sp ecimens during the acute  phase of infection. The expected result is Negative. Fact Sheet for Patients:  StrictlyIdeas.no Fact Sheet for Healthcare Providers: BankingDealers.co.za This test is not yet approved or cleared by the [place] FDA and has been authorized for detection and/or diagnosis of SARS-CoV-2 by FDA under an Emergency Use Authorization (EUA).  This EUA will remain in effect (meaning this test can be used) for the duration of the COVID-19 declaration under Section 564(b)(1) of the Act, 21 U.S.C. section 360bbb-3(b)(1), unless the authorization is terminated  or revoked sooner. Performed at [facility], [address] [place], [place] [address]    Dg Lumbar Spine Complete  Result Date: 04/17/2019 CLINICAL DATA:  Sacral pain sudden onset 3 days ago, does lifting at job, uncertain if related EXAM: [place] 4+ VIEW COMPARISON:  None Correlation: CT abdomen and pelvis 04/02/2016 FINDINGS: Osseous mineralization normal. Five non-rib-bearing lumbar vertebra. Vertebral body and disc space heights maintained. No fracture, subluxation, bone destruction or spondylolysis. SI joints preserved. IMPRESSION: No acute osseous abnormalities. Electronically Signed   By: Lavonia Dana M.D.   On: 04/17/2019 09:21   Dg Sacrum/coccyx  Result Date: 04/17/2019 CLINICAL DATA:  It has acute onset acral pain 3 days ago EXAM: SACRUM AND COCCYX - 2+ VIEW COMPARISON:  None FINDINGS: Osseous mineralization normal. Symmetric appearance of sacral foramina and SI joints, which appear preserved. No sacrococcygeal fracture or bone destruction. Hip joints appear preserved. IMPRESSION: Normal exam. Electronically Signed   By: Lavonia Dana M.D.   On: 04/17/2019 09:36   Ct Pelvis W Contrast  Result Date: 04/17/2019 CLINICAL DATA:  Per ED notes: Presents with rectal pain That began Tuesday, he was seen by his primary care doctor yesterday and had blood work and x rays and told everything was fine. Last night he began running a fever and today the fever is 103.0. Hard to have a bowel movement since pain began. Normal prostate exam yesterday. Flu shot on Sunday. EXAM: CT PELVIS WITH CONTRAST TECHNIQUE: Multidetector CT imaging of the pelvis was performed using the standard protocol following the bolus administration of intravenous contrast. CONTRAST:  144mL OMNIPAQUE IOHEXOL 300 MG/ML  SOLN COMPARISON:  04/02/2016 CT FINDINGS: Urinary Tract:  Distal ureters and urinary bladder are unremarkable. Bowel: A 2.4 x 2.3 x 3.4 centimeter low-attenuation lesion is identified  immediately posterior to the anus, associated with  adjacent subcutaneous fat stranding. Findings are consistent with perirectal abscess. Visualized loops of large and small bowel are unremarkable. The appendix is well seen and has a normal appearance. Vascular/Lymphatic: Small numerous inguinal lymph nodes are likely reactive, not reaching grade tear for pathologic enlargement. No retroperitoneal adenopathy. Reproductive: Coarse calcifications are identified within the prostate, otherwise normal in appearance. Other:  No ascites. Anterior abdominal wall is unremarkable. Musculoskeletal: No suspicious bone lesions identified. IMPRESSION: 1. Posterior perirectal abscess measuring 2.4 x 2.3 x 3.4 centimeter. 2. Adjacent subcutaneous fat inflammatory changes. 3. No evidence for acute appendicitis. Electronically Signed   By: Norva Pavlov M.D.   On: 04/17/2019 20:07   Dg Chest Port 1 View  Result Date: 04/17/2019 CLINICAL DATA:  Fever EXAM: PORTABLE CHEST 1 VIEW COMPARISON:  None. FINDINGS: The heart size and mediastinal contours are within normal limits. Both lungs are clear. The visualized skeletal structures are unremarkable. IMPRESSION: No acute cardiopulmonary disease. Electronically Signed   By: Jonna Clark M.D.   On: 04/17/2019 19:10    Pending Labs Unresulted Labs (From admission, onward)    Start     Ordered   04/17/19 2342  HIV antibody (Routine Testing)  Once,   STAT     04/17/19 2344   04/17/19 1821  Blood Culture (routine x 2)  BLOOD CULTURE X 2,   STAT     04/17/19 1823   04/17/19 1821  Urine culture  ONCE - STAT,   STAT     04/17/19 1823   04/17/19 1815  Lactic acid, plasma  Now then every 2 hours,   STAT     04/17/19 1814   04/17/19 1815  Urinalysis, Routine w reflex microscopic  ONCE - STAT,   STAT     04/17/19 1814          Vitals/Pain Today's Vitals   04/17/19 2031 04/17/19 2032 04/17/19 2100 04/17/19 2134  BP: (!) 104/58  102/66 (!) 104/55  Pulse: 98  (!) 102 96   Resp: 15  18 20   Temp: (!) 102.7 F (39.3 C)     TempSrc: Oral     SpO2: 96%  95% 96%  PainSc:  7   4     Isolation Precautions No active isolations  Medications Medications  sodium chloride flush (NS) 0.9 % injection 3 mL (3 mLs Intravenous Not Given 04/17/19 1951)  0.9 %  sodium chloride infusion ( Intravenous Stopped 04/17/19 2029)  0.9 %  sodium chloride infusion ( Intravenous Transfusing/Transfer 04/17/19 2132)  HYDROmorphone (DILAUDID) injection 1 mg (has no administration in time range)  dextrose 5 % and 0.45 % NaCl with KCl 20 mEq/L infusion (has no administration in time range)  Ampicillin-Sulbactam (UNASYN) 3 g in sodium chloride 0.9 % 100 mL IVPB (has no administration in time range)  acetaminophen (TYLENOL) tablet 650 mg (has no administration in time range)    Or  acetaminophen (TYLENOL) suppository 650 mg (has no administration in time range)  HYDROcodone-acetaminophen (NORCO/VICODIN) 5-325 MG per tablet 1-2 tablet (has no administration in time range)  HYDROmorphone (DILAUDID) injection 1-2 mg (has no administration in time range)  ondansetron (ZOFRAN-ODT) disintegrating tablet 4 mg (has no administration in time range)    Or  ondansetron (ZOFRAN) injection 4 mg (has no administration in time range)  acetaminophen (TYLENOL) tablet 650 mg (650 mg Oral Given 04/17/19 1933)  metroNIDAZOLE (FLAGYL) IVPB 500 mg ( Intravenous Stopped 04/17/19 2132)  ceFEPIme (MAXIPIME) 2 g in sodium chloride 0.9 % 100 mL  IVPB ( Intravenous Stopped 04/17/19 2016)  vancomycin (VANCOCIN) IVPB 1000 mg/200 mL premix ( Intravenous Stopped 04/17/19 2124)  fentaNYL (SUBLIMAZE) injection 50 mcg (50 mcg Intravenous Given 04/17/19 1928)  ondansetron (ZOFRAN) injection 4 mg (4 mg Intravenous Given 04/17/19 1931)  iohexol (OMNIPAQUE) 300 MG/ML solution 100 mL (100 mLs Intravenous Contrast Given 04/17/19 1948)  fentaNYL (SUBLIMAZE) injection 50 mcg (50 mcg Intravenous Given 04/17/19 2048)    Mobility walks Low fall  risk   Focused Assessments   R Recommendations: See Admitting Provider Note

## 2019-04-17 NOTE — ED Notes (Signed)
Pt transferred from [facility] to see a surgeon for his perirectal abscess

## 2019-04-17 NOTE — ED Provider Notes (Signed)
[facility] EMERGENCY DEPARTMENT Provider Note   CSN: [id number] Arrival date & time: 04/17/19  1757     History   Chief Complaint Chief Complaint  Patient presents with   Rectal Pain    HPI Gregory Fowler is a 43 y.o. male who presents emergency department chief complaint of buttock and rectal pain.  Patient states that he had onset of pain in his buttocks and rectum 4 days ago.  Patient states that the pain became almost unbearable yesterday and he went to see his primary care doctor yesterday.  He had an anoscopy performed and there is no evidence of mass or internal hemorrhoid.  He also had a sacral x-ray that was negative.  Patient states that when he left he developed a fever.Marland Kitchen.  He states that this morning he felt a little bit better and was able to go to work but by the time he got home his pain was severe and he was running a high fever.  Patient denies abdominal pain, nausea or vomiting.     HPI  Past Medical History:  Diagnosis Date   Abdominal pain, other specified site    Backache, unspecified    Fatty liver    Gastroparesis    GERD (gastroesophageal reflux disease)    Hiatal hernia    Kidney stone    Nonspecific elevation of levels of transaminase or lactic acid dehydrogenase (LDH)     Patient Active Problem List   Diagnosis Date Noted   Preventative health care 09/08/2014   Situational anxiety 03/31/2013   Gastroparesis 10/11/2012   GERD (gastroesophageal reflux disease) 05/01/2011   Eructation 05/01/2011   NAFLD (nonalcoholic fatty liver disease) 40/98/119109/18/2012   Back pain 04/08/2011   Abnormal LFTs 03/03/2011   TOBACCO USER 07/20/2010    Past Surgical History:  Procedure Laterality Date   lasix eye surgery Bilateral    TONSILLECTOMY          Home Medications    Prior to Admission medications   Medication Sig Fowler Date End Date Taking? Authorizing Provider  atorvastatin (LIPITOR) 20 MG tablet Take 1 tablet (20  mg total) by mouth daily. 04/06/19   [name]'Sullivan, Melissa, NP  co-enzyme Q-10 30 MG capsule Take 30 mg by mouth 3 (three) times daily.    [provider]  Multiple Vitamin (DAILY VITAMIN PO) Take 1 packet by mouth daily. MEN'S SPORT VITAMIN PACK.  Omega 3, Grape Seed Extract, Milk Thistle Extract, Tribulus Terrestris, Vit D3.    [provider]  Omega-3 Fatty Acids (FISH OIL PO) Take 1 capsule by mouth daily.    [provider]  pantoprazole (PROTONIX) 40 MG tablet Take 1 tablet (40 mg total) by mouth daily. 04/03/19   [name]'Sullivan, Melissa, NP  Probiotic Product (PROBIOTIC DAILY) CAPS Take by mouth daily.    [provider]  Specialty Vitamins Products (ULTIMATE FAT BURNER PO) Take 1 tablet by mouth daily.    [provider]    Family History Family History  Problem Relation Age of Onset   Liver disease Mother        fatty liver- she is overweight   Heart disease Paternal Aunt    Colon cancer Neg Hx    Colon polyps Neg Hx    Diabetes Neg Hx    Kidney disease Neg Hx    Gallbladder disease Neg Hx    Esophageal cancer Neg Hx     Social History Social History   Tobacco Use   Smoking  status: Current Every Day Smoker    Packs/day: 1.00    Years: 30.00    Pack years: 30.00    Types: Cigarettes    Fowler date: 04/13/2017   Smokeless tobacco: Former User    Types: Chew  Substance Use Topics   Alcohol use: Yes    Alcohol/week: 1.0 standard drinks    Types: 1 Cans of beer per week    Comment: occasional   Drug use: No     Allergies   [name].   Review of Systems Review of Systems  Ten systems reviewed and are negative for acute change, except as noted in the HPI.   Physical Exam Updated Vital Signs BP (!) 104/55    Pulse 96    Temp (!) 102.7 F (39.3 C) (Oral)    Resp 20    SpO2 96%   Physical Exam Vitals signs and nursing note reviewed. Exam conducted with a chaperone present.  Constitutional:        General: He is not in acute distress.    Appearance: He is well-developed. He is toxic-appearing. He is not diaphoretic.  HENT:     Head: Normocephalic and atraumatic.  Eyes:     General: No scleral icterus.    Conjunctiva/sclera: Conjunctivae normal.  Neck:     Musculoskeletal: Normal range of motion and neck supple.  Cardiovascular:     Rate and Rhythm: Normal rate and regular rhythm.     Heart sounds: Normal heart sounds.  Pulmonary:     Effort: Pulmonary effort is normal. No respiratory distress.     Breath sounds: Normal breath sounds.  Abdominal:     General: There is no distension.     Palpations: Abdomen is soft.     Tenderness: There is no abdominal tenderness. There is no guarding.  Genitourinary:    Comments: Erythema and maceration in the gluteal cleft  Tenderness and fullness of the anus at 6:00 with palpation.  Brown stool on examining finger. Skin:    General: Skin is warm and dry.  Neurological:     General: No focal deficit present.     Mental Status: He is alert and oriented to person, place, and time.  Psychiatric:        Behavior: Behavior normal.      ED Treatments / Results  Labs (all labs ordered are listed, but only abnormal results are displayed) Labs Reviewed  COMPREHENSIVE METABOLIC PANEL - Abnormal; Notable for the following components:      Result Value   Potassium 3.4 (*)    Glucose, Bld 141 (*)    ALT 47 (*)    All other components within normal limits  CBC WITH DIFFERENTIAL/PLATELET - Abnormal; Notable for the following components:   WBC 11.5 (*)    HCT 38.9 (*)    Neutro Abs 9.5 (*)    All other components within normal limits  SARS CORONAVIRUS 2 (HOSPITAL ORDER, PERFORMED IN [facility])  CULTURE, BLOOD (ROUTINE X 2)  CULTURE, BLOOD (ROUTINE X 2)  URINE CULTURE  LACTIC ACID, PLASMA  APTT  PROTIME-INR  LACTIC ACID, PLASMA  URINALYSIS, ROUTINE W REFLEX MICROSCOPIC    EKG EKG  Interpretation  Date/Time:  Friday April 17 2019 19:13:59 EDT Ventricular Rate:  105 PR Interval:    QRS Duration: 99 QT Interval:  311 QTC Calculation: 411 R Axis:   81 Text Interpretation:  Sinus tachycardia No previous ECGs available Confirmed by Vanetta MuldersZackowski, Scott [phone]([id number])  on 04/17/2019 7:18:16 PM   Radiology Dg Lumbar Spine Complete  Result Date: 04/17/2019 CLINICAL DATA:  Sacral pain sudden onset 3 days ago, does lifting at job, uncertain if related EXAM: [place] 4+ VIEW COMPARISON:  None Correlation: CT abdomen and pelvis 04/02/2016 FINDINGS: Osseous mineralization normal. Five non-rib-bearing lumbar vertebra. Vertebral body and disc space heights maintained. No fracture, subluxation, bone destruction or spondylolysis. SI joints preserved. IMPRESSION: No acute osseous abnormalities. Electronically Signed   By: Lavonia Dana M.D.   On: 04/17/2019 09:21   Dg Sacrum/coccyx  Result Date: 04/17/2019 CLINICAL DATA:  It has acute onset acral pain 3 days ago EXAM: SACRUM AND COCCYX - 2+ VIEW COMPARISON:  None FINDINGS: Osseous mineralization normal. Symmetric appearance of sacral foramina and SI joints, which appear preserved. No sacrococcygeal fracture or bone destruction. Hip joints appear preserved. IMPRESSION: Normal exam. Electronically Signed   By: Lavonia Dana M.D.   On: 04/17/2019 09:36   Ct Pelvis W Contrast  Result Date: 04/17/2019 CLINICAL DATA:  Per ED notes: Presents with rectal pain That began Tuesday, he was seen by his primary care doctor yesterday and had blood work and x rays and told everything was fine. Last night he began running a fever and today the fever is 103.0. Hard to have a bowel movement since pain began. Normal prostate exam yesterday. Flu shot on Sunday. EXAM: CT PELVIS WITH CONTRAST TECHNIQUE: Multidetector CT imaging of the pelvis was performed using the standard protocol following the bolus administration of intravenous contrast. CONTRAST:  129mL  OMNIPAQUE IOHEXOL 300 MG/ML  SOLN COMPARISON:  04/02/2016 CT FINDINGS: Urinary Tract:  Distal ureters and urinary bladder are unremarkable. Bowel: A 2.4 x 2.3 x 3.4 centimeter low-attenuation lesion is identified immediately posterior to the anus, associated with adjacent subcutaneous fat stranding. Findings are consistent with perirectal abscess. Visualized loops of large and small bowel are unremarkable. The appendix is well seen and has a normal appearance. Vascular/Lymphatic: Small numerous inguinal lymph nodes are likely reactive, not reaching grade tear for pathologic enlargement. No retroperitoneal adenopathy. Reproductive: Coarse calcifications are identified within the prostate, otherwise normal in appearance. Other:  No ascites. Anterior abdominal wall is unremarkable. Musculoskeletal: No suspicious bone lesions identified. IMPRESSION: 1. Posterior perirectal abscess measuring 2.4 x 2.3 x 3.4 centimeter. 2. Adjacent subcutaneous fat inflammatory changes. 3. No evidence for acute appendicitis. Electronically Signed   By: Nolon Nations M.D.   On: 04/17/2019 20:07   Dg Chest Port 1 View  Result Date: 04/17/2019 CLINICAL DATA:  Fever EXAM: PORTABLE CHEST 1 VIEW COMPARISON:  None. FINDINGS: The heart size and mediastinal contours are within normal limits. Both lungs are clear. The visualized skeletal structures are unremarkable. IMPRESSION: No acute cardiopulmonary disease. Electronically Signed   By: Prudencio Pair M.D.   On: 04/17/2019 19:10    Procedures Procedures (including critical care time)  Medications Ordered in ED Medications  sodium chloride flush (NS) 0.9 % injection 3 mL (3 mLs Intravenous Not Given 04/17/19 1951)  0.9 %  sodium chloride infusion ( Intravenous Stopped 04/17/19 2029)  0.9 %  sodium chloride infusion ( Intravenous Transfusing/Transfer 04/17/19 2132)  acetaminophen (TYLENOL) tablet 650 mg (650 mg Oral Given 04/17/19 1933)  metroNIDAZOLE (FLAGYL) IVPB 500 mg ( Intravenous  Stopped 04/17/19 2132)  ceFEPIme (MAXIPIME) 2 g in sodium chloride 0.9 % 100 mL IVPB ( Intravenous Stopped 04/17/19 2016)  vancomycin (VANCOCIN) IVPB 1000 mg/200 mL premix ( Intravenous Stopped 04/17/19 2124)  fentaNYL (SUBLIMAZE) injection 50 mcg (  50 mcg Intravenous Given 04/17/19 1928)  ondansetron (ZOFRAN) injection 4 mg (4 mg Intravenous Given 04/17/19 1931)  iohexol (OMNIPAQUE) 300 MG/ML solution 100 mL (100 mLs Intravenous Contrast Given 04/17/19 1948)  fentaNYL (SUBLIMAZE) injection 50 mcg (50 mcg Intravenous Given 04/17/19 2048)     Initial Impression / Assessment and Plan / ED Course  I have reviewed the triage vital signs and the nursing notes.  Pertinent labs & imaging results that were available during my care of the patient were reviewed by me and considered in my medical decision making (see chart for details).  Clinical Course as of Apr 16 2229  Fri Apr 17, 2019  2007 WBC(!): 11.5 [AH]    Clinical Course User Index [AH] Arthor Captain, PA-C       CC: The pain and fever VS:  Vitals:   04/17/19 1915 04/17/19 2031 04/17/19 2100 04/17/19 2134  BP: 108/67 (!) 104/58 102/66 (!) 104/55  Pulse: (!) 106 98 (!) 102 96  Resp: 17 15 18 20   Temp:  (!) 102.7 F (39.3 C)    TempSrc:  Oral    SpO2: 94% 96% 95% 96%    ZO:XWRUEAV is gathered by the patient and EMR and wife at bedside. DDX: Differential diagnosis includes proctitis, prostatitis, diverticulitis, anal fissure, hemorrhoid, deep tissue abscess and sepsis. Labs: I reviewed the labs which show elevated white blood cell count, negative lactic acid, normal PT/INR and APTT.  CMP shows slightly low potassium, elevated blood glucose likely due to acute phase reaction.  Coronavirus test is negative, urine is pending. Imaging: I personally reviewed the images (portable chest x-ray and CT pelvis with contrast) which show(s) chest x-ray negative for acute abnormality.  CT pelvis shows fluid collection consistent with perirectal  abscess. EKG:  EKG Interpretation  Date/Time:  Friday April 17 2019 19:13:59 EDT Ventricular Rate:  105 PR Interval:    QRS Duration: 99 QT Interval:  311 QTC Calculation: 411 R Axis:   81 Text Interpretation:  Sinus tachycardia No previous ECGs available Confirmed by Vanetta Mulders [phone]) on 04/17/2019 7:18:16 PM       MDM:.  Patient with perirectal abscess and fever.  I spoken with Dr. Gerrit Friends who will take the patient to the OR for for drainage.  I have discussed this with the patient who agrees with plan of care.  Patient was transferred to the [facility] long emergency department.  I spoke with Dr. Lockie Mola who is excepted the patient to the ER. Patient disposition: Admit Patient condition: Stable. The patient appears reasonably stabilized for admission considering the current resources, flow, and capabilities available in the ED at this time, and I doubt any other [facility] requiring further screening and/or treatment in the ED prior to admission.  Gregory Fowler was evaluated in Emergency Department on 04/17/2019 for the symptoms described in the history of present illness. He was evaluated in the context of the global COVID-19 pandemic, which necessitated consideration that the patient might be at risk for infection with the SARS-CoV-2 virus that causes COVID-19. Institutional protocols and algorithms that pertain to the evaluation of patients at risk for COVID-19 are in a state of rapid change based on information released by regulatory bodies including the CDC and federal and state organizations. These policies and algorithms were followed during the patient's care in the ED.  Final Clinical Impressions(s) / ED Diagnoses   Final diagnoses:  Perirectal abscess    ED Discharge Orders    None  Arthor Captain, PA-C 04/17/19 2230    Vanetta Mulders, MD 04/23/19 1818

## 2019-04-17 NOTE — ED Notes (Signed)
Pt. Reports seeing Dr. Wilburn Mylar with anal pain and feeling this pain since Tuesday.  Pt. Reports the Dr. Rockey Situ him this was nothing.  Pt. Is fevered and warm to touch.  Pt. Has noted pain in his rectum.

## 2019-04-17 NOTE — ED Triage Notes (Addendum)
Presents with rectal pain That began Tuesday, he was seen by his primary care doctor yesterday and had blood work and x rays and told everything was fine. Last night he began running a fever and today the fever is 103.0. He reports that sometimes it hard to have a BM since the pain began.  He denies urinary symptoms and hematuria. He had a prostate exam yesterday and was told it was normal.  He received a flu shot on Sunday.

## 2019-04-17 NOTE — ED Provider Notes (Signed)
  10:47 PM Patient arrived from [facility] for general surgery consultation due to perirectal abscess.  He reports a great deal of pain still.  Ordered additional meds. Temp down to 99.71F on my re-check. General surgery consulted to notify of arrival in ED.  11:33 PM Dr. Harlow Asa has evaluated in the ED, will place admission orders.  Plan for OR in the morning.   Larene Pickett, PA-C 04/17/19 2333    Lennice Sites, DO 04/18/19 [id number]

## 2019-04-17 NOTE — ED Notes (Signed)
Assisted PA C Abby with rectal exam.  Pt. Tolerated well.  Pt. Has stool in his colon and reports the exam is slightly painful.

## 2019-04-17 NOTE — H&P (Signed)
Gregory Fowler is an 43 y.o. male.    General Surgery [facility]  Chief Complaint: perirectal abscess  HPI: Patient is a 43 year old male who developed perirectal pain 3 days prior to admission.  Pain persisted and became more severe.  He was seen at the office of his primary care physician and evaluated.  He was told this was musculoskeletal in origin and sent home.  Patient developed fever.  He presented to the emergency department for evaluation.  White blood cell count was elevated at 11.5.  CT scan of the abdomen and pelvis demonstrated a 3.4 cm abscess immediately posterior to the anus.  Patient was transported to [facility] for surgical assessment and management.  Patient has no prior history of perirectal abscess or anorectal disease.  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  He has a history of gastroesophageal reflux.  Past Medical History:  Diagnosis Date  . Abdominal pain, other specified site   . Backache, unspecified   . Fatty liver   . Gastroparesis   . GERD (gastroesophageal reflux disease)   . Hiatal hernia   . Kidney stone   . Nonspecific elevation of levels of transaminase or lactic acid dehydrogenase (LDH)     Past Surgical History:  Procedure Laterality Date  . lasix eye surgery Bilateral   . TONSILLECTOMY      Family History  Problem Relation Age of Onset  . Liver disease Mother        fatty liver- she is overweight  . Heart disease Paternal Aunt   . Colon cancer Neg Hx   . Colon polyps Neg Hx   . Diabetes Neg Hx   . Kidney disease Neg Hx   . Gallbladder disease Neg Hx   . Esophageal cancer Neg Hx    Social History:  reports that he has been smoking cigarettes. He started smoking about 2 years ago. He has a 30.00 pack-year smoking history. He has quit using smokeless tobacco.  His smokeless tobacco use included chew. He reports current alcohol use of about 1.0 standard drinks of alcohol per week. He reports that  he does not use drugs.  Allergies: No Known Allergies  (Not in a hospital admission)   Results for orders placed or performed during the hospital encounter of 04/17/19 (from the past 48 hour(s))  Lactic acid, plasma     Status: None   Collection Time: 04/17/19  6:52 PM  Result Value Ref Range   Lactic Acid, Venous 1.9 0.5 - 1.9 mmol/L    Comment: Performed at [facility], [address] [place], [place] [address]  Comprehensive metabolic panel     Status: Abnormal   Collection Time: 04/17/19  6:52 PM  Result Value Ref Range   Sodium 136 135 - 145 mmol/L   Potassium 3.4 (L) 3.5 - 5.1 mmol/L   Chloride 101 98 - 111 mmol/L   CO2 23 22 - 32 mmol/L   Glucose, Bld 141 (H) 70 - 99 mg/dL   BUN 14 6 - 20 mg/dL   Creatinine, Ser 0.09 0.61 - 1.24 mg/dL   Calcium 9.0 8.9 - 38.1 mg/dL   Total Protein 7.1 6.5 - 8.1 g/dL   Albumin 3.8 3.5 - 5.0 g/dL   AST 35 15 - 41 U/L   ALT 47 (H) 0 - 44 U/L   Alkaline Phosphatase 74 38 - 126 U/L   Total Bilirubin 0.5 0.3 - 1.2 mg/dL  GFR calc non Af Amer >60 >60 mL/min   GFR calc Af Amer >60 >60 mL/min   Anion gap 12 5 - 15    Comment: Performed at [facility] [place], [address] [place], [place] [address]  CBC with Differential     Status: Abnormal   Collection Time: 04/17/19  6:52 PM  Result Value Ref Range   WBC 11.5 (H) 4.0 - 10.5 K/uL   RBC 4.55 4.22 - 5.81 MIL/uL   Hemoglobin 13.2 13.0 - 17.0 g/dL   HCT 21.338.9 (L) 08.639.0 - 57.852.0 %   MCV 85.5 80.0 - 100.0 fL   MCH 29.0 26.0 - 34.0 pg   MCHC 33.9 30.0 - 36.0 g/dL   RDW 46.912.2 62.911.5 - 52.815.5 %   Platelets 205 150 - 400 K/uL   nRBC 0.0 0.0 - 0.2 %   Neutrophils Relative % 83 %   Neutro Abs 9.5 (H) 1.7 - 7.7 K/uL   Lymphocytes Relative 10 %   Lymphs Abs 1.2 0.7 - 4.0 K/uL   Monocytes Relative 6 %   Monocytes Absolute 0.7 0.1 - 1.0 K/uL   Eosinophils Relative 1 %   Eosinophils Absolute 0.1 0.0 - 0.5 K/uL   Basophils Relative 0 %   Basophils Absolute 0.0 0.0 - 0.1 K/uL    Immature Granulocytes 0 %   Abs Immature Granulocytes 0.04 0.00 - 0.07 K/uL    Comment: Performed at [facility] [place], [address] [place], [place] [address]  APTT     Status: None   Collection Time: 04/17/19  6:52 PM  Result Value Ref Range   aPTT 31 24 - 36 seconds    Comment: Performed at [facility] [place], [address] [place], [place] [address]  Protime-INR     Status: None   Collection Time: 04/17/19  6:52 PM  Result Value Ref Range   Prothrombin Time 13.2 11.4 - 15.2 seconds   INR 1.0 0.8 - 1.2    Comment: (NOTE) INR goal varies based on device and disease states. Performed at [facility] [place], [address] [place], [place] [address]   SARS Coronavirus 2 [facility](Hospital order, Performed in [facility] hospital lab) Nasopharyngeal Nasopharyngeal Swab     Status: None   Collection Time: 04/17/19  7:41 PM   Specimen: Nasopharyngeal Swab  Result Value Ref Range   SARS Coronavirus 2 NEGATIVE NEGATIVE    Comment: (NOTE) If result is NEGATIVE SARS-CoV-2 target nucleic acids are NOT DETECTED. The SARS-CoV-2 RNA is generally detectable in upper and lower  respiratory specimens during the acute phase of infection. The lowest  concentration of SARS-CoV-2 viral copies this assay can detect is 250  copies / mL. A negative result does not preclude SARS-CoV-2 infection  and should not be used as the sole basis for treatment or other  patient management decisions.  A negative result may occur with  improper specimen collection / handling, submission of specimen other  than nasopharyngeal swab, presence of viral mutation(s) within the  areas targeted by this assay, and inadequate number of viral copies  (<250 copies / mL). A negative result must be combined with clinical  observations, patient history, and epidemiological information. If result is POSITIVE SARS-CoV-2 target nucleic acids are DETECTED. The SARS-CoV-2 RNA is generally detectable in upper and lower   respiratory specimens dur ing the acute phase of infection.  Positive  results are indicative of active infection with SARS-CoV-2.  Clinical  correlation with patient history and other diagnostic information is  necessary to determine patient infection status.  Positive results do  not rule out bacterial infection or co-infection with other viruses. If result is PRESUMPTIVE POSTIVE SARS-CoV-2 nucleic acids MAY BE PRESENT.   A presumptive positive result was obtained on the submitted specimen  and confirmed on repeat testing.  While 2019 novel coronavirus  (SARS-CoV-2) nucleic acids may be present in the submitted sample  additional confirmatory testing may be necessary for epidemiological  and / or clinical management purposes  to differentiate between  SARS-CoV-2 and other Sarbecovirus currently known to infect humans.  If clinically indicated additional testing with an alternate test  methodology [phone]) is advised. The SARS-CoV-2 RNA is generally  detectable in upper and lower respiratory sp ecimens during the acute  phase of infection. The expected result is Negative. Fact Sheet for Patients:  StrictlyIdeas.no Fact Sheet for Healthcare Providers: BankingDealers.co.za This test is not yet approved or cleared by the [place] FDA and has been authorized for detection and/or diagnosis of SARS-CoV-2 by FDA under an Emergency Use Authorization (EUA).  This EUA will remain in effect (meaning this test can be used) for the duration of the COVID-19 declaration under Section 564(b)(1) of the Act, 21 U.S.C. section 360bbb-3(b)(1), unless the authorization is terminated or revoked sooner. Performed at [facility], [address] [place], [place] [address]    Dg Lumbar Spine Complete  Result Date: 04/17/2019 CLINICAL DATA:  Sacral pain sudden onset 3 days ago, does lifting at job, uncertain if related EXAM: [place] 4+ VIEW COMPARISON:  None Correlation: CT abdomen and pelvis 04/02/2016 FINDINGS: Osseous mineralization normal. Five non-rib-bearing lumbar vertebra. Vertebral body and disc space heights maintained. No fracture, subluxation, bone destruction or spondylolysis. SI joints preserved. IMPRESSION: No acute osseous abnormalities. Electronically Signed   By: Lavonia Dana M.D.   On: 04/17/2019 09:21   Dg Sacrum/coccyx  Result Date: 04/17/2019 CLINICAL DATA:  It has acute onset acral pain 3 days ago EXAM: SACRUM AND COCCYX - 2+ VIEW COMPARISON:  None FINDINGS: Osseous mineralization normal. Symmetric appearance of sacral foramina and SI joints, which appear preserved. No sacrococcygeal fracture or bone destruction. Hip joints appear preserved. IMPRESSION: Normal exam. Electronically Signed   By: Lavonia Dana M.D.   On: 04/17/2019 09:36   Ct Pelvis W Contrast  Result Date: 04/17/2019 CLINICAL DATA:  Per ED notes: Presents with rectal pain That began Tuesday, he was seen by his primary care doctor yesterday and had blood work and x rays and told everything was fine. Last night he began running a fever and today the fever is 103.0. Hard to have a bowel movement since pain began. Normal prostate exam yesterday. Flu shot on Sunday. EXAM: CT PELVIS WITH CONTRAST TECHNIQUE: Multidetector CT imaging of the pelvis was performed using the standard protocol following the bolus administration of intravenous contrast. CONTRAST:  177mL OMNIPAQUE IOHEXOL 300 MG/ML  SOLN COMPARISON:  04/02/2016 CT FINDINGS: Urinary Tract:  Distal ureters and urinary bladder are unremarkable. Bowel: A 2.4 x 2.3 x 3.4 centimeter low-attenuation lesion is identified immediately posterior to the anus, associated with adjacent subcutaneous fat stranding. Findings are consistent with perirectal abscess. Visualized loops of large and small bowel are unremarkable. The appendix is well seen and has a normal appearance. Vascular/Lymphatic: Small  numerous inguinal lymph nodes are likely reactive, not reaching grade tear for pathologic enlargement. No retroperitoneal adenopathy. Reproductive: Coarse calcifications are  identified within the prostate, otherwise normal in appearance. Other:  No ascites. Anterior abdominal wall is unremarkable. Musculoskeletal: No suspicious bone lesions identified. IMPRESSION: 1. Posterior perirectal abscess measuring 2.4 x 2.3 x 3.4 centimeter. 2. Adjacent subcutaneous fat inflammatory changes. 3. No evidence for acute appendicitis. Electronically Signed   By: Norva PavlovElizabeth  Brown M.D.   On: 04/17/2019 20:07   Dg Chest Port 1 View  Result Date: 04/17/2019 CLINICAL DATA:  Fever EXAM: PORTABLE CHEST 1 VIEW COMPARISON:  None. FINDINGS: The heart size and mediastinal contours are within normal limits. Both lungs are clear. The visualized skeletal structures are unremarkable. IMPRESSION: No acute cardiopulmonary disease. Electronically Signed   By: Jonna ClarkBindu  Avutu M.D.   On: 04/17/2019 19:10    Review of Systems  Constitutional: Positive for fever. Negative for chills and diaphoresis.  HENT: Negative.   Eyes: Negative.   Respiratory: Negative.   Cardiovascular: Negative.   Gastrointestinal:       Rectal pain  Genitourinary: Negative.   Musculoskeletal: Negative.   Skin: Negative.   Neurological: Negative.   Endo/Heme/Allergies: Negative.   Psychiatric/Behavioral: Negative.     Blood pressure (!) 104/55, pulse 96, temperature (!) 102.7 F (39.3 C), temperature source Oral, resp. rate 20, SpO2 96 %. Physical Exam  Constitutional: He is oriented to person, place, and time. He appears well-developed and well-nourished. No distress.  HENT:  Head: Normocephalic and atraumatic.  Right Ear: External ear normal.  Left Ear: External ear normal.  Eyes: Pupils are equal, round, and reactive to light. Conjunctivae are normal. No scleral icterus.  Neck: Normal range of motion. Neck supple. No tracheal deviation present. No  thyromegaly present.  Cardiovascular: Normal rate, regular rhythm and normal heart sounds.  No murmur heard. Respiratory: Effort normal and breath sounds normal. No respiratory distress. He has no wheezes.  GI: Bowel sounds are normal. He exhibits no distension. There is no abdominal tenderness.  Genitourinary:    Genitourinary Comments: Anoderm is normal.  There is tenderness in the posterior midline approximately 2 cm from the anus with underlying induration.  No sign of drainage or fistula.   Musculoskeletal: Normal range of motion.        General: No deformity or edema.  Neurological: He is alert and oriented to person, place, and time.  Skin: Skin is warm and dry. He is not diaphoretic.  Psychiatric: He has a normal mood and affect. His behavior is normal.     Assessment/Plan Perirectal abscess  Admit to surgical service  IV abx's started at [facility] this evening - will continue  Rx for pain as needed  NPO  Plan OR at 7 AM 9/5 under anesthesia for I&D  The risks and benefits of the procedure have been discussed at length with the patient.  The patient understands the proposed procedure, potential alternative treatments, and the course of recovery to be expected.  All of the patient's questions have been answered at this time.  The patient wishes to proceed with surgery.  Darnell Levelodd Damariz Paganelli, MD [facility] Office: [phone]    Darnell Levelodd Pasty Manninen, MD 04/17/2019, 11:34 PM

## 2019-04-18 ENCOUNTER — Observation Stay (HOSPITAL_COMMUNITY): Payer: Managed Care, Other (non HMO) | Admitting: Certified Registered Nurse Anesthetist

## 2019-04-18 ENCOUNTER — Encounter (HOSPITAL_COMMUNITY): Admission: EM | Disposition: A | Discharge status: Home / Self Care | Payer: Self-pay | Source: Home / Self Care

## 2019-04-18 ENCOUNTER — Encounter (HOSPITAL_COMMUNITY): Payer: Self-pay | Admitting: Surgery

## 2019-04-18 HISTORY — PX: INCISION AND DRAINAGE PERIRECTAL ABSCESS: SHX1804

## 2019-04-18 LAB — SURGICAL PCR SCREEN
MRSA, PCR: NEGATIVE
Staphylococcus aureus: NEGATIVE

## 2019-04-18 SURGERY — INCISION AND DRAINAGE, ABSCESS, PERIRECTAL
Anesthesia: General | Site: Rectum

## 2019-04-18 MED ORDER — SODIUM CHLORIDE 0.9 % IV SOLN
3.0000 g | Freq: Four times a day (QID) | INTRAVENOUS | Status: DC
  Administered 2019-04-18 – 2019-04-19 (×5): 3 g via INTRAVENOUS
  Filled 2019-04-18: qty 8
  Filled 2019-04-18 (×4): qty 3

## 2019-04-18 MED ORDER — LIDOCAINE 2% (20 MG/ML) 5 ML SYRINGE
INTRAMUSCULAR | Status: DC | PRN
  Administered 2019-04-18: 50 mg via INTRAVENOUS

## 2019-04-18 MED ORDER — ONDANSETRON 4 MG PO TBDP: 4.0000 mg | ORAL_TABLET | Freq: Four times a day (QID) | ORAL | Status: DC | PRN

## 2019-04-18 MED ORDER — HYDROMORPHONE HCL 1 MG/ML IJ SOLN: 1.0000 mg | INTRAMUSCULAR | Status: DC | PRN

## 2019-04-18 MED ORDER — 0.9 % SODIUM CHLORIDE (POUR BTL) OPTIME
TOPICAL | Status: DC | PRN
  Administered 2019-04-18: 1000 mL

## 2019-04-18 MED ORDER — DEXAMETHASONE SODIUM PHOSPHATE 4 MG/ML IJ SOLN
INTRAMUSCULAR | Status: DC | PRN
  Administered 2019-04-18: 8 mg via INTRAVENOUS

## 2019-04-18 MED ORDER — FENTANYL CITRATE (PF) 100 MCG/2ML IJ SOLN
INTRAMUSCULAR | Status: AC
  Filled 2019-04-18: qty 2

## 2019-04-18 MED ORDER — LIDOCAINE 2% (20 MG/ML) 5 ML SYRINGE
INTRAMUSCULAR | Status: AC
  Filled 2019-04-18: qty 5

## 2019-04-18 MED ORDER — LACTATED RINGERS IV BOLUS
500.0000 mL | Freq: Once | INTRAVENOUS | Status: AC
  Administered 2019-04-18: 500 mL via INTRAVENOUS

## 2019-04-18 MED ORDER — MUPIROCIN 2 % EX OINT: 1.0000 "application " | TOPICAL_OINTMENT | Freq: Two times a day (BID) | CUTANEOUS | Status: DC

## 2019-04-18 MED ORDER — LACTATED RINGERS IV SOLN
INTRAVENOUS | Status: DC | PRN
  Administered 2019-04-18: 07:00:00 via INTRAVENOUS

## 2019-04-18 MED ORDER — HYDROMORPHONE HCL 1 MG/ML IJ SOLN
INTRAMUSCULAR | Status: AC
  Filled 2019-04-18: qty 1

## 2019-04-18 MED ORDER — HYDROMORPHONE HCL 1 MG/ML IJ SOLN
0.2500 mg | INTRAMUSCULAR | Status: DC | PRN
  Administered 2019-04-18: 0.25 mg via INTRAVENOUS

## 2019-04-18 MED ORDER — KCL IN DEXTROSE-NACL 20-5-0.45 MEQ/L-%-% IV SOLN
INTRAVENOUS | Status: DC
  Administered 2019-04-18 (×2): via INTRAVENOUS
  Filled 2019-04-18 (×2): qty 1000

## 2019-04-18 MED ORDER — MEPERIDINE HCL 50 MG/ML IJ SOLN: 6.2500 mg | INTRAMUSCULAR | Status: DC | PRN

## 2019-04-18 MED ORDER — ACETAMINOPHEN 500 MG PO TABS
1000.0000 mg | ORAL_TABLET | Freq: Four times a day (QID) | ORAL | Status: DC
  Administered 2019-04-18 – 2019-04-19 (×2): 1000 mg via ORAL
  Filled 2019-04-18 (×2): qty 2

## 2019-04-18 MED ORDER — ONDANSETRON HCL 4 MG/2ML IJ SOLN
INTRAMUSCULAR | Status: DC | PRN
  Administered 2019-04-18: 4 mg via INTRAVENOUS

## 2019-04-18 MED ORDER — PROMETHAZINE HCL 25 MG/ML IJ SOLN: 6.2500 mg | INTRAMUSCULAR | Status: DC | PRN

## 2019-04-18 MED ORDER — MIDAZOLAM HCL 2 MG/2ML IJ SOLN
INTRAMUSCULAR | Status: DC | PRN
  Administered 2019-04-18: 2 mg via INTRAVENOUS

## 2019-04-18 MED ORDER — ONDANSETRON HCL 4 MG/2ML IJ SOLN: 4.0000 mg | Freq: Four times a day (QID) | INTRAMUSCULAR | Status: DC | PRN

## 2019-04-18 MED ORDER — FENTANYL CITRATE (PF) 100 MCG/2ML IJ SOLN
INTRAMUSCULAR | Status: DC | PRN
  Administered 2019-04-18 (×4): 25 ug via INTRAVENOUS

## 2019-04-18 MED ORDER — MIDAZOLAM HCL 2 MG/2ML IJ SOLN: 0.5000 mg | Freq: Once | INTRAMUSCULAR | Status: DC | PRN

## 2019-04-18 MED ORDER — PROPOFOL 10 MG/ML IV BOLUS
INTRAVENOUS | Status: DC | PRN
  Administered 2019-04-18: 200 mg via INTRAVENOUS
  Administered 2019-04-18: 50 mg via INTRAVENOUS

## 2019-04-18 MED ORDER — PROPOFOL 10 MG/ML IV BOLUS
INTRAVENOUS | Status: AC
  Filled 2019-04-18: qty 20

## 2019-04-18 MED ORDER — TRAMADOL HCL 50 MG PO TABS
50.0000 mg | ORAL_TABLET | Freq: Four times a day (QID) | ORAL | Status: DC | PRN
  Administered 2019-04-18: 50 mg via ORAL
  Filled 2019-04-18: qty 1

## 2019-04-18 MED ORDER — HYDROCODONE-ACETAMINOPHEN 5-325 MG PO TABS
1.0000 | ORAL_TABLET | ORAL | Status: DC | PRN
  Administered 2019-04-18 – 2019-04-19 (×2): 2 via ORAL
  Filled 2019-04-18 (×2): qty 2

## 2019-04-18 MED ORDER — MIDAZOLAM HCL 2 MG/2ML IJ SOLN
INTRAMUSCULAR | Status: AC
  Filled 2019-04-18: qty 2

## 2019-04-18 SURGICAL SUPPLY — 27 items
BLADE SURG 15 STRL LF DISP TIS (BLADE) ×1 IMPLANT
BLADE SURG 15 STRL SS (BLADE) ×1
BRIEF STRETCH FOR OB PAD LRG (UNDERPADS AND DIAPERS) ×2 IMPLANT
COVER SURGICAL LIGHT HANDLE (MISCELLANEOUS) ×2 IMPLANT
COVER WAND RF STERILE (DRAPES) IMPLANT
DRAIN PENROSE 18X1/2 LTX STRL (DRAIN) IMPLANT
DRESSING DUODERM 4X4 STERILE (GAUZE/BANDAGES/DRESSINGS) ×2 IMPLANT
DRSG PAD ABDOMINAL 8X10 ST (GAUZE/BANDAGES/DRESSINGS) ×2 IMPLANT
ELECT PENCIL ROCKER SW 15FT (MISCELLANEOUS) ×2 IMPLANT
ELECT REM PT RETURN 15FT ADLT (MISCELLANEOUS) ×2 IMPLANT
GAUZE PACKING IODOFORM 1/2 (PACKING) ×2 IMPLANT
GAUZE SPONGE 4X4 12PLY STRL (GAUZE/BANDAGES/DRESSINGS) ×2 IMPLANT
GLOVE SURG ORTHO 8.0 STRL STRW (GLOVE) ×2 IMPLANT
GOWN STRL REUS W/TWL XL LVL3 (GOWN DISPOSABLE) ×4 IMPLANT
KIT BASIN OR (CUSTOM PROCEDURE TRAY) ×2 IMPLANT
KIT TURNOVER KIT A (KITS) IMPLANT
PACK LITHOTOMY IV (CUSTOM PROCEDURE TRAY) ×2 IMPLANT
PACKING VAGINAL (PACKING) IMPLANT
SPONGE LAP 18X18 RF (DISPOSABLE) ×2 IMPLANT
SURGILUBE 2OZ TUBE FLIPTOP (MISCELLANEOUS) ×2 IMPLANT
SUT ETHILON 2 0 PS N (SUTURE) IMPLANT
SWAB COLLECTION DEVICE MRSA (MISCELLANEOUS) ×2 IMPLANT
SWAB CULTURE ESWAB REG 1ML (MISCELLANEOUS) ×2 IMPLANT
TOWEL OR 17X26 10 PK STRL BLUE (TOWEL DISPOSABLE) ×2 IMPLANT
TOWEL OR NON WOVEN STRL DISP B (DISPOSABLE) ×2 IMPLANT
UNDERPAD 30X36 HEAVY ABSORB (UNDERPADS AND DIAPERS) ×2 IMPLANT
YANKAUER SUCT BULB TIP 10FT TU (MISCELLANEOUS) ×2 IMPLANT

## 2019-04-18 NOTE — Op Note (Signed)
Operative Note  Pre-operative Diagnosis: Perirectal abscess  Post-operative Diagnosis: Same  Surgeon:  Armandina Gemma, MD  Assistant: None   Procedure: Examination under anesthesia, incision, drainage, and open packing of perirectal abscess  Anesthesia: General  Estimated Blood Loss: Minimal  Drains: None         Specimen: Aerobic and anaerobic cultures to laboratory  Indications: Patient is a 43 year old male who presents to the emergency department with 3-day history of rectal pain.  CT scan confirmed perirectal abscess.  Patient now comes to surgery for management.  Procedure Details:  The patient was seen in the pre-op holding area. The risks, benefits, complications, treatment options, and expected outcomes were previously discussed with the patient. The patient agreed with the proposed plan and has signed the informed consent form.  The patient was brought to the operating room by the surgical team, identified as Glynn Octave and the procedure verified. A "time out" was completed and the above information confirmed.  Following administration of general anesthesia, the patient was placed in lithotomy and then prepped and draped in the usual aseptic fashion.  After ascertaining that an adequate level of anesthesia been achieved, palpation around the perineum revealed induration posteriorly involving both medial buttocks.  Incision was made in the midline using the electrocautery for hemostasis.  A small ellipse of skin was excised.  Dissection was carried through the subcutaneous tissues and a abscess cavity was entered.  Creamy purulent fluid was evacuated.  Cavity was explored digitally and measures approximately 5 x 3 x 4 cm in size.  Cavity was irrigated with warm saline.  Prior to irrigation, aerobic and anaerobic cultures were obtained and submitted to the laboratory.  Good hemostasis was noted.  Cavity was packed with half-inch iodoform gauze packing.  Dry gauze dressing was  applied.  Patient was awakened from anesthesia and brought to the recovery room.  The patient tolerated the procedure well.   Armandina Gemma, MD [facility] Office: [phone]

## 2019-04-18 NOTE — Anesthesia Postprocedure Evaluation (Signed)
Anesthesia Post Note  Patient: Evrett Hakim  Procedure(s) Performed: IRRIGATION AND DEBRIDEMENT PERIRECTAL ABSCESS EXAM UNDER ANESTHESIA (N/A Rectum)     Patient location during evaluation: PACU Anesthesia Type: General Level of consciousness: awake and alert, patient cooperative and oriented Pain management: pain level controlled Vital Signs Assessment: post-procedure vital signs reviewed and stable Respiratory status: spontaneous breathing, nonlabored ventilation and respiratory function stable Cardiovascular status: blood pressure returned to baseline and stable Postop Assessment: no apparent nausea or vomiting Anesthetic complications: no    Last Vitals:  Vitals:   04/18/19 0900 04/18/19 0940  BP: (!) 85/52 97/62  Pulse:  76  Resp:  14  Temp:  37.2 C  SpO2: 95% 94%    Last Pain:  Vitals:   04/18/19 0940  TempSrc: Axillary  PainSc: 6                  Rada Zegers,E. Lela Murfin

## 2019-04-18 NOTE — Transfer of Care (Signed)
Immediate Anesthesia Transfer of Care Note  Patient: Gregory Fowler  Procedure(s) Performed: Procedure(s): IRRIGATION AND DEBRIDEMENT PERIRECTAL ABSCESS EXAM UNDER ANESTHESIA (N/A)  Patient Location: PACU  Anesthesia Type:General  Level of Consciousness:  sedated, patient cooperative and responds to stimulation  Airway & Oxygen Therapy:Patient Spontanous Breathing and Patient connected to face mask oxgen  Post-op Assessment:  Report given to PACU RN and Post -op Vital signs reviewed and stable  Post vital signs:  Reviewed and stable  Last Vitals:  Vitals:   04/18/19 0451 04/18/19 0547  BP: 95/63 96/64  Pulse: 90 86  Resp: 16 20  Temp: (!) 39.3 C (!) 38.7 C  SpO2: 55% 73%    Complications: No apparent anesthesia complications

## 2019-04-18 NOTE — Anesthesia Procedure Notes (Signed)
Procedure Name: LMA Insertion Date/Time: 04/18/2019 7:11 AM Performed by: Anne Fu, CRNA Pre-anesthesia Checklist: Patient identified, Emergency Drugs available, Suction available, Patient being monitored and Timeout performed Patient Re-evaluated:Patient Re-evaluated prior to induction Oxygen Delivery Method: Circle system utilized Preoxygenation: Pre-oxygenation with 100% oxygen Induction Type: IV induction Ventilation: Mask ventilation without difficulty LMA: LMA inserted LMA Size: 4.0 Number of attempts: 1 Placement Confirmation: positive ETCO2 and breath sounds checked- equal and bilateral Tube secured with: Tape

## 2019-04-18 NOTE — Progress Notes (Signed)
Paged Ccs, Md, MD at [facility] due to patient's yellow MEWS score. Ccs, Md, MD referred me to page Dr. Armandina Gemma. Paged Dr. Armandina Gemma to report yellow MEWS score/temperature of 102.2 and reported the intervention implemented (Tylenol 650 mg given at 0044). Dr. Armandina Gemma didn't have any further orders. Will continue to monitor the patient's temperature/vital signs and will encourage use of incentive spirometer.

## 2019-04-18 NOTE — Anesthesia Preprocedure Evaluation (Signed)
Anesthesia Evaluation  Patient identified by MRN, date of birth, ID band Patient awake    Reviewed: Allergy & Precautions, NPO status , Patient's Chart, lab work & pertinent test results  History of Anesthesia Complications Negative for: history of anesthetic complications  Airway Mallampati: I  TM Distance: >3 FB Neck ROM: Full    Dental  (+) Dental Advisory Given, Caps   Pulmonary Current SmokerPatient did not abstain from smoking.,  04/17/2019 SARS coronavirus NEG   breath sounds clear to auscultation       Cardiovascular negative cardio ROS   Rhythm:Regular Rate:Normal     Neuro/Psych negative neurological ROS     GI/Hepatic Neg liver ROS, GERD  Medicated and Controlled,  Endo/Other  negative endocrine ROS  Renal/GU negative Renal ROS     Musculoskeletal   Abdominal   Peds  Hematology negative hematology ROS (+)   Anesthesia Other Findings   Reproductive/Obstetrics                             Anesthesia Physical Anesthesia Plan  ASA: II  Anesthesia Plan: General   Post-op Pain Management:    Induction: Intravenous  PONV Risk Score and Plan: 1 and Ondansetron and Dexamethasone  Airway Management Planned: LMA  Additional Equipment:   Intra-op Plan:   Post-operative Plan:   Informed Consent: I have reviewed the patients History and Physical, chart, labs and discussed the procedure including the risks, benefits and alternatives for the proposed anesthesia with the patient or authorized representative who has indicated his/her understanding and acceptance.     Dental advisory given  Plan Discussed with: CRNA and Surgeon  Anesthesia Plan Comments:         Anesthesia Quick Evaluation

## 2019-04-18 NOTE — Interval H&P Note (Signed)
History and Physical Interval Note:  04/18/2019 6:43 AM  Gregory Fowler  has presented today for surgery, with the diagnosis of Peri-rectal abcess.  The various methods of treatment have been discussed with the patient and family. After consideration of risks, benefits and other options for treatment, the patient has consented to    Procedure(s): IRRIGATION AND [place] (N/A) as a surgical intervention.    The patient's history has been reviewed, patient examined, no change in status, stable for surgery.  I have reviewed the patient's chart and labs.  Questions were answered to the patient's satisfaction.    Armandina Gemma, [place] [facility] Office: [place]

## 2019-04-18 NOTE — Progress Notes (Signed)
MEWS score now green.  

## 2019-04-19 ENCOUNTER — Encounter (HOSPITAL_COMMUNITY): Payer: Self-pay | Admitting: Surgery

## 2019-04-19 DIAGNOSIS — E663 Overweight: Secondary | ICD-10-CM | POA: Diagnosis present

## 2019-04-19 DIAGNOSIS — Z8249 Family history of ischemic heart disease and other diseases of the circulatory system: Secondary | ICD-10-CM | POA: Diagnosis not present

## 2019-04-19 DIAGNOSIS — Z79899 Other long term (current) drug therapy: Secondary | ICD-10-CM | POA: Diagnosis not present

## 2019-04-19 DIAGNOSIS — Z6827 Body mass index (BMI) 27.0-27.9, adult: Secondary | ICD-10-CM | POA: Diagnosis not present

## 2019-04-19 DIAGNOSIS — K760 Fatty (change of) liver, not elsewhere classified: Secondary | ICD-10-CM | POA: Diagnosis present

## 2019-04-19 DIAGNOSIS — K449 Diaphragmatic hernia without obstruction or gangrene: Secondary | ICD-10-CM | POA: Diagnosis present

## 2019-04-19 DIAGNOSIS — F17210 Nicotine dependence, cigarettes, uncomplicated: Secondary | ICD-10-CM | POA: Diagnosis present

## 2019-04-19 DIAGNOSIS — K611 Rectal abscess: Secondary | ICD-10-CM | POA: Diagnosis present

## 2019-04-19 DIAGNOSIS — K219 Gastro-esophageal reflux disease without esophagitis: Secondary | ICD-10-CM | POA: Diagnosis present

## 2019-04-19 DIAGNOSIS — Z20828 Contact with and (suspected) exposure to other viral communicable diseases: Secondary | ICD-10-CM | POA: Diagnosis present

## 2019-04-19 LAB — BASIC METABOLIC PANEL
Anion gap: 12 (ref 5–15)
BUN: 11 mg/dL (ref 6–20)
CO2: 24 mmol/L (ref 22–32)
Calcium: 9 mg/dL (ref 8.9–10.3)
Chloride: 101 mmol/L (ref 98–111)
Creatinine, Ser: 0.74 mg/dL (ref 0.61–1.24)
GFR calc Af Amer: >60 mL/min (ref >60)
GFR calc non Af Amer: >60 mL/min (ref >60)
Glucose, Bld: 187 mg/dL — ABNORMAL HIGH (ref 70–99)
Potassium: 4 mmol/L (ref 3.5–5.1)
Sodium: 137 mmol/L (ref 135–145)

## 2019-04-19 LAB — HIV ANTIBODY (ROUTINE TESTING W REFLEX): HIV Screen 4th Generation wRfx: NONREACTIVE

## 2019-04-19 MED ORDER — TRAMADOL HCL 50 MG PO TABS: 50.0000 mg | ORAL_TABLET | Freq: Four times a day (QID) | ORAL | 0 refills | Status: DC | PRN

## 2019-04-19 MED ORDER — AMOXICILLIN-POT CLAVULANATE 875-125 MG PO TABS: 1.0000 | ORAL_TABLET | Freq: Two times a day (BID) | ORAL | 0 refills | Status: AC

## 2019-04-19 NOTE — Discharge Summary (Signed)
Physician Discharge Summary [facility]  Patient ID: Gregory Fowler MRN: [id number] DOB/AGE: 1976/05/02 43 y.o.  Admit date: 04/17/2019 Discharge date: 04/19/2019  Admission Diagnoses:  Perirectal abscess  Discharge Diagnoses:  Principal Problem:   Perirectal abscess   Discharged Condition: good  Hospital Course: Patient was admitted for observation following drainage of perirectal abscess surgery.  Post op course was uncomplicated.  Pain was well controlled.  Tolerated diet.  Dressing was changed prior to discharge.  Patient was prepared for discharge home on POD#1.  Consults: None  Treatments: surgery: I&D of perirectal abscess  Discharge Exam: Blood pressure (!) 96/57, pulse (!) 55, temperature 97.8 F (36.6 C), temperature source Oral, resp. rate 17, height 6\' 2"  (1.88 m), weight 96.6 kg, SpO2 95 %. HEENT - clear Neck - soft Chest - clear bilaterally Cor - RRR GU - dressing dry and intact; mild tenderness in area  Disposition: Home  Discharge Instructions    Admit to Inpatient (patient's expected length of stay will be greater than 2 midnights or inpatient only procedure)   Complete by: As directed    Hospital Area: [place]   Level of Care: Med-Surg   Covid Evaluation: Confirmed COVID Negative   Diagnosis: Perirectal abscess   Level of Care: Med-Surg   Admitting Physician: Harlow Asa, Ege Muckey   Estimated length of stay: past midnight tomorrow   Certification: I certify this patient will need inpatient services for at least 2 midnights   Attending Physician: Harlow Asa, Sendy Pluta   Diet - low sodium heart healthy   Complete by: As directed    Discharge instructions   Complete by: As directed    [place]:   Carry a list of your medications and allergies with you at all times  Call your pharmacy at least 1 week in advance to refill prescriptions  Do not mix any prescribed  pain medicine with alcohol  Do not drive any motor vehicles while taking pain medication  Take medications with food unless otherwise directed  Follow-up appointments (date to return to physician): Please call [phone] to confirm your follow up appointment with your surgeon.  Call your Surgeon if you have:  Temperature greater than 101.0  Persistent nausea and vomiting  Severe uncontrolled pain  Redness, tenderness, or signs of infection (pain, swelling, redness, odor or    green/yellow discharge around the site)  Difficulty breathing, headache or visual disturbances  Hives  Persistent dizziness or light-headedness  Any other questions or concerns you may have after discharge  In an emergency, call 911 or go to an Emergency Department at a nearby hospital.   Diet: Begin with liquids, and if they are tolerated, resume your usual diet.  Avoid spicy, greasy or heavy foods.  If you have nausea or vomiting, go back to liquids.  If you cannot keep liquids down, call your doctor.  Avoid alcohol consumption while on prescription pain medications. Good nutrition promotes healing. Increase fiber and fluids.   ADDITIONAL INSTRUCTIONS: May shower.  Use handheld shower to clean area around wound.  May also do tub soaks.  Wear dry pad (Kotex pad) until drainage stops.  Earnstine Regal, MD, [facility] Office: [phone]   Discharge wound care:   Complete by: As directed    See discharge instructions.   Increase activity slowly   Complete by: As directed      Allergies as of 04/19/2019  No Known Allergies     Medication List    TAKE these medications   amoxicillin-clavulanate 875-125 MG tablet Commonly known as: Augmentin Take 1 tablet by mouth every 12 (twelve) hours for 5 days.   atorvastatin 20 MG tablet Commonly known as: LIPITOR Take 1 tablet (20 mg total) by mouth daily.   DAILY VITAMIN PO Take 1 packet by mouth daily. MEN'S SPORT VITAMIN PACK.   Omega 3, Grape Seed Extract, Milk Thistle Extract, Tribulus Terrestris, Vit D3.   FISH OIL PO Take 1 capsule by mouth daily.   milk thistle 175 MG tablet Take 175 mg by mouth daily.   pantoprazole 40 MG tablet Commonly known as: PROTONIX Take 1 tablet (40 mg total) by mouth daily.   Probiotic Daily Caps Take by mouth daily.   traMADol 50 MG tablet Commonly known as: ULTRAM Take 1-2 tablets (50-100 mg total) by mouth every 6 (six) hours as needed.            Discharge Care Instructions  (From admission, onward)         Start     Ordered   04/19/19 0000  Discharge wound care:    Comments: See discharge instructions.   04/19/19 0813         Follow-up Information    [facility] Schedule an appointment as soon as possible for a visit in 2 week(s).   Specialty: General Surgery Contact information: [address] [place] [id number] [phone]          Velora Hecklerodd M. Darvin Dials, MD, [facility] Office: [phone]   Signed: Darnell Levelodd Aadon Gorelik 04/19/2019, 8:13 AM

## 2019-04-22 LAB — CULTURE, BLOOD (ROUTINE X 2)
Culture: NO GROWTH
Culture: NO GROWTH
Special Requests: ADEQUATE
Special Requests: ADEQUATE

## 2019-04-22 LAB — AEROBIC/ANAEROBIC CULTURE W GRAM STAIN (SURGICAL/DEEP WOUND): Gram Stain: NONE SEEN

## 2019-04-29 ENCOUNTER — Encounter: Payer: Self-pay | Admitting: Family

## 2019-05-20 ENCOUNTER — Other Ambulatory Visit (INDEPENDENT_AMBULATORY_CARE_PROVIDER_SITE_OTHER): Payer: Managed Care, Other (non HMO)

## 2019-05-20 ENCOUNTER — Other Ambulatory Visit: Payer: Self-pay | Admitting: Family

## 2019-05-20 ENCOUNTER — Other Ambulatory Visit: Payer: Self-pay

## 2019-05-20 DIAGNOSIS — E785 Hyperlipidemia, unspecified: Secondary | ICD-10-CM

## 2019-05-20 LAB — LIPID PANEL
Cholesterol: 219 mg/dL — ABNORMAL HIGH (ref 0–200)
HDL: 34.6 mg/dL — ABNORMAL LOW (ref >39.00)
NonHDL: 184.46
Total CHOL/HDL Ratio: 6
Triglycerides: 270 mg/dL — ABNORMAL HIGH (ref 0.0–149.0)
VLDL: 54 mg/dL — ABNORMAL HIGH (ref 0.0–40.0)

## 2019-05-20 LAB — LDL CHOLESTEROL, DIRECT: Direct LDL: 173 mg/dL

## 2019-05-20 NOTE — Telephone Encounter (Signed)
Patient reported he has not been taking the 20 mg, he "has been taking other things some people recommended". He said he will message provider about it.  Patient was advised to start his 20 mg, he said "is going to message provider about it before making a decision"

## 2019-05-20 NOTE — Progress Notes (Signed)
ldl

## 2019-05-20 NOTE — Telephone Encounter (Signed)
Cholesterol has improved slightly but still above goal.  Please confirm that he has been taking lipitor, and if so, I would like him to increase from 20mg  to 40mg  once daily.

## 2019-05-20 NOTE — Telephone Encounter (Signed)
Repeat lipids in 6 weeks.

## 2019-05-22 ENCOUNTER — Encounter: Payer: Self-pay | Admitting: Family

## 2019-05-23 ENCOUNTER — Encounter: Payer: Self-pay | Admitting: Family

## 2019-07-01 ENCOUNTER — Other Ambulatory Visit: Payer: Self-pay

## 2019-07-01 ENCOUNTER — Other Ambulatory Visit (INDEPENDENT_AMBULATORY_CARE_PROVIDER_SITE_OTHER): Payer: Managed Care, Other (non HMO)

## 2019-07-01 DIAGNOSIS — E785 Hyperlipidemia, unspecified: Secondary | ICD-10-CM | POA: Diagnosis not present

## 2019-07-01 LAB — LIPID PANEL
Cholesterol: 229 mg/dL — ABNORMAL HIGH (ref 0–200)
HDL: 35.7 mg/dL — ABNORMAL LOW (ref >39.00)
NonHDL: 193.26
Total CHOL/HDL Ratio: 6
Triglycerides: 216 mg/dL — ABNORMAL HIGH (ref 0.0–149.0)
VLDL: 43.2 mg/dL — ABNORMAL HIGH (ref 0.0–40.0)

## 2019-07-01 LAB — LDL CHOLESTEROL, DIRECT: Direct LDL: 164 mg/dL

## 2019-07-02 ENCOUNTER — Encounter: Payer: Self-pay | Admitting: Family

## 2019-07-28 ENCOUNTER — Emergency Department (HOSPITAL_COMMUNITY): Payer: Managed Care, Other (non HMO)

## 2019-07-28 ENCOUNTER — Emergency Department (HOSPITAL_COMMUNITY): Payer: Managed Care, Other (non HMO) | Admitting: Anesthesiology

## 2019-07-28 ENCOUNTER — Inpatient Hospital Stay (HOSPITAL_COMMUNITY)
Admission: EM | Admit: 2019-07-28 | Discharge: 2019-07-30 | DRG: 346 | Disposition: A | Discharge status: Home / Self Care | Payer: Managed Care, Other (non HMO) | Attending: General Surgery | Admitting: General Surgery

## 2019-07-28 ENCOUNTER — Encounter (HOSPITAL_COMMUNITY): Admission: EM | Disposition: A | Discharge status: Home / Self Care | Payer: Self-pay | Source: Home / Self Care

## 2019-07-28 ENCOUNTER — Encounter (HOSPITAL_COMMUNITY): Payer: Self-pay

## 2019-07-28 ENCOUNTER — Other Ambulatory Visit: Payer: Self-pay

## 2019-07-28 ENCOUNTER — Telehealth: Payer: Self-pay | Admitting: Family

## 2019-07-28 DIAGNOSIS — K644 Residual hemorrhoidal skin tags: Secondary | ICD-10-CM | POA: Diagnosis present

## 2019-07-28 DIAGNOSIS — K611 Rectal abscess: Secondary | ICD-10-CM

## 2019-07-28 DIAGNOSIS — Z87442 Personal history of urinary calculi: Secondary | ICD-10-CM

## 2019-07-28 DIAGNOSIS — Z79899 Other long term (current) drug therapy: Secondary | ICD-10-CM

## 2019-07-28 DIAGNOSIS — K219 Gastro-esophageal reflux disease without esophagitis: Secondary | ICD-10-CM | POA: Diagnosis present

## 2019-07-28 DIAGNOSIS — F17210 Nicotine dependence, cigarettes, uncomplicated: Secondary | ICD-10-CM | POA: Diagnosis present

## 2019-07-28 DIAGNOSIS — K3184 Gastroparesis: Secondary | ICD-10-CM | POA: Diagnosis present

## 2019-07-28 DIAGNOSIS — Z20828 Contact with and (suspected) exposure to other viral communicable diseases: Secondary | ICD-10-CM | POA: Diagnosis present

## 2019-07-28 DIAGNOSIS — K760 Fatty (change of) liver, not elsewhere classified: Secondary | ICD-10-CM | POA: Diagnosis present

## 2019-07-28 DIAGNOSIS — K5900 Constipation, unspecified: Secondary | ICD-10-CM | POA: Diagnosis present

## 2019-07-28 DIAGNOSIS — K648 Other hemorrhoids: Secondary | ICD-10-CM | POA: Diagnosis present

## 2019-07-28 HISTORY — DX: Rectal abscess: K61.1

## 2019-07-28 LAB — BASIC METABOLIC PANEL
Anion gap: 10 (ref 5–15)
BUN: 13 mg/dL (ref 6–20)
CO2: 26 mmol/L (ref 22–32)
Calcium: 9.4 mg/dL (ref 8.9–10.3)
Chloride: 102 mmol/L (ref 98–111)
Creatinine, Ser: 0.95 mg/dL (ref 0.61–1.24)
GFR calc Af Amer: >60 mL/min (ref >60)
GFR calc non Af Amer: >60 mL/min (ref >60)
Glucose, Bld: 94 mg/dL (ref 70–99)
Potassium: 3.6 mmol/L (ref 3.5–5.1)
Sodium: 138 mmol/L (ref 135–145)

## 2019-07-28 LAB — CBC WITH DIFFERENTIAL/PLATELET
Abs Immature Granulocytes: 0.05 10*3/uL (ref 0.00–0.07)
Basophils Absolute: 0.1 10*3/uL (ref 0.0–0.1)
Basophils Relative: 0 %
Eosinophils Absolute: 0.1 10*3/uL (ref 0.0–0.5)
Eosinophils Relative: 1 %
HCT: 43.2 % (ref 39.0–52.0)
Hemoglobin: 14.5 g/dL (ref 13.0–17.0)
Immature Granulocytes: 0 %
Lymphocytes Relative: 14 %
Lymphs Abs: 1.9 10*3/uL (ref 0.7–4.0)
MCH: 28.6 pg (ref 26.0–34.0)
MCHC: 33.6 g/dL (ref 30.0–36.0)
MCV: 85.2 fL (ref 80.0–100.0)
Monocytes Absolute: 1.3 10*3/uL — ABNORMAL HIGH (ref 0.1–1.0)
Monocytes Relative: 10 %
Neutro Abs: 9.9 10*3/uL — ABNORMAL HIGH (ref 1.7–7.7)
Neutrophils Relative %: 75 %
Platelets: 184 10*3/uL (ref 150–400)
RBC: 5.07 MIL/uL (ref 4.22–5.81)
RDW: 13.2 % (ref 11.5–15.5)
WBC: 13.2 10*3/uL — ABNORMAL HIGH (ref 4.0–10.5)
nRBC: 0 % (ref 0.0–0.2)

## 2019-07-28 LAB — RESPIRATORY PANEL BY RT PCR (FLU A&B, COVID)
Influenza A by PCR: NEGATIVE
Influenza B by PCR: NEGATIVE
SARS Coronavirus 2 by RT PCR: NEGATIVE

## 2019-07-28 SURGERY — INCISION AND DRAINAGE, ABSCESS
Anesthesia: General

## 2019-07-28 MED ORDER — SUFENTANIL CITRATE 50 MCG/ML IV SOLN
INTRAVENOUS | Status: AC
  Filled 2019-07-28: qty 1

## 2019-07-28 MED ORDER — ONDANSETRON 4 MG PO TBDP: 4.0000 mg | ORAL_TABLET | Freq: Four times a day (QID) | ORAL | Status: DC | PRN

## 2019-07-28 MED ORDER — TRAMADOL HCL 50 MG PO TABS: 50.0000 mg | ORAL_TABLET | Freq: Four times a day (QID) | ORAL | Status: DC | PRN

## 2019-07-28 MED ORDER — MIDAZOLAM HCL 2 MG/2ML IJ SOLN
INTRAMUSCULAR | Status: AC
  Filled 2019-07-28: qty 2

## 2019-07-28 MED ORDER — PIPERACILLIN-TAZOBACTAM 3.375 G IVPB
3.3750 g | Freq: Three times a day (TID) | INTRAVENOUS | Status: DC
  Administered 2019-07-28 – 2019-07-30 (×5): 3.375 g via INTRAVENOUS
  Filled 2019-07-28 (×9): qty 50

## 2019-07-28 MED ORDER — SODIUM CHLORIDE 0.9 % IV SOLN
3.0000 g | Freq: Once | INTRAVENOUS | Status: AC
  Administered 2019-07-28: 3 g via INTRAVENOUS
  Filled 2019-07-28: qty 8

## 2019-07-28 MED ORDER — MORPHINE SULFATE (PF) 4 MG/ML IV SOLN
8.0000 mg | Freq: Once | INTRAVENOUS | Status: AC
  Administered 2019-07-28: 8 mg via INTRAVENOUS
  Filled 2019-07-28: qty 2

## 2019-07-28 MED ORDER — MORPHINE SULFATE (PF) 2 MG/ML IV SOLN
1.0000 mg | INTRAVENOUS | Status: DC | PRN
  Administered 2019-07-28: 2 mg via INTRAVENOUS
  Administered 2019-07-29: 3 mg via INTRAVENOUS
  Administered 2019-07-29: 2 mg via INTRAVENOUS
  Administered 2019-07-29: 3 mg via INTRAVENOUS
  Filled 2019-07-28: qty 2
  Filled 2019-07-28 (×2): qty 1
  Filled 2019-07-28: qty 2

## 2019-07-28 MED ORDER — DEXAMETHASONE SODIUM PHOSPHATE 10 MG/ML IJ SOLN
INTRAMUSCULAR | Status: AC
  Filled 2019-07-28: qty 1

## 2019-07-28 MED ORDER — ONDANSETRON HCL 4 MG/2ML IJ SOLN
INTRAMUSCULAR | Status: AC
  Filled 2019-07-28: qty 2

## 2019-07-28 MED ORDER — HYDROCODONE-ACETAMINOPHEN 5-325 MG PO TABS
1.0000 | ORAL_TABLET | ORAL | Status: DC | PRN
  Administered 2019-07-29: 2 via ORAL
  Filled 2019-07-28: qty 2

## 2019-07-28 MED ORDER — ONDANSETRON HCL 4 MG/2ML IJ SOLN
4.0000 mg | Freq: Four times a day (QID) | INTRAMUSCULAR | Status: DC | PRN
  Administered 2019-07-28 – 2019-07-29 (×2): 4 mg via INTRAVENOUS
  Filled 2019-07-28: qty 2

## 2019-07-28 MED ORDER — FENTANYL CITRATE (PF) 250 MCG/5ML IJ SOLN
INTRAMUSCULAR | Status: AC
  Filled 2019-07-28: qty 5

## 2019-07-28 MED ORDER — PROPOFOL 10 MG/ML IV BOLUS
INTRAVENOUS | Status: AC
  Filled 2019-07-28: qty 40

## 2019-07-28 MED ORDER — KCL IN DEXTROSE-NACL 20-5-0.45 MEQ/L-%-% IV SOLN
INTRAVENOUS | Status: DC
  Filled 2019-07-28 (×2): qty 1000

## 2019-07-28 MED ORDER — IOHEXOL 300 MG/ML  SOLN
100.0000 mL | Freq: Once | INTRAMUSCULAR | Status: AC | PRN
  Administered 2019-07-28: 100 mL via INTRAVENOUS

## 2019-07-28 NOTE — Telephone Encounter (Signed)
Call transferred from Melody at [facility]. Pt noting back pain, constipation, sweats and chills. Pt states he had an abcess in September 2020 and it feels about the same way. He said that he probably will need surgery again. Advised pt with sweats & chills the office has to start with a virtual appointment and if he has an abcess we would refer to general surgery/GI/ER/hospital as we cannot perform that in the office. Pt states he will proceed to ER.

## 2019-07-28 NOTE — ED Triage Notes (Signed)
Pt presents with c/o possible abscess in his rectum. Pt reports he has a hx of same in September and had the same symptoms then as he had now. Pt reports chills, pain on his right side, and constipation.

## 2019-07-28 NOTE — Anesthesia Preprocedure Evaluation (Deleted)
Anesthesia Evaluation    Reviewed: Allergy & Precautions, Patient's Chart, lab work & pertinent test results  History of Anesthesia Complications Negative for: history of anesthetic complications  Airway    Neck ROM: Full    Dental  (+) Dental Advisory Given   Pulmonary Current Smoker,  04/17/2019 SARS coronavirus NEG          Cardiovascular negative cardio ROS   Rhythm:Regular     Neuro/Psych PSYCHIATRIC DISORDERS Anxiety negative neurological ROS     GI/Hepatic Neg liver ROS, GERD  Medicated,  Endo/Other  negative endocrine ROS  Renal/GU negative Renal ROS     Musculoskeletal   Abdominal   Peds  Hematology negative hematology ROS (+)   Anesthesia Other Findings   Reproductive/Obstetrics                             Anesthesia Physical  Anesthesia Plan  ASA: II  Anesthesia Plan: General   Post-op Pain Management:    Induction: Intravenous, Cricoid pressure planned and Rapid sequence  PONV Risk Score and Plan: 2 and Ondansetron and Dexamethasone  Airway Management Planned: Oral ETT  Additional Equipment:   Intra-op Plan:   Post-operative Plan: Extubation in OR  Informed Consent:   Plan Discussed with: CRNA  Anesthesia Plan Comments:        Anesthesia Quick Evaluation

## 2019-07-28 NOTE — ED Provider Notes (Signed)
[facility] DEPT Provider Note   CSN: [id number] Arrival date & time: 07/28/19  1320     History Chief Complaint  Patient presents with  . Recurrent Skin Infections    Gregory Fowler is a 43 y.o. male.  HPI    42 year old with history of perirectal abscess comes in a chief complaint of rectal pain.  Patient reports about 3 or 4 days ago he started having pain in his rectum.  Over time the symptoms have gotten worse.  He has nausea, subjective fevers and chills.  He denies any diaphoresis.  His symptoms are similar to his prior perirectal abscess for which she required incision and drainage in the OR back in September.  Patient does not have any history of diabetes.  Past Medical History:  Diagnosis Date  . Abdominal pain, other specified site   . Backache, unspecified   . Fatty liver   . Gastroparesis   . GERD (gastroesophageal reflux disease)   . Hiatal hernia   . Kidney stone   . Nonspecific elevation of levels of transaminase or lactic acid dehydrogenase (LDH)     Patient Active Problem List   Diagnosis Date Noted  . Perirectal abscess 04/17/2019  . Preventative health care 09/08/2014  . Situational anxiety 03/31/2013  . Gastroparesis 10/11/2012  . GERD (gastroesophageal reflux disease) 05/01/2011  . Eructation 05/01/2011  . NAFLD (nonalcoholic fatty liver disease) 05/01/2011  . Back pain 04/08/2011  . Abnormal LFTs 03/03/2011  . TOBACCO USER 07/20/2010    Past Surgical History:  Procedure Laterality Date  . INCISION AND DRAINAGE PERIRECTAL ABSCESS N/A 04/18/2019   Procedure: IRRIGATION AND DEBRIDEMENT PERIRECTAL ABSCESS EXAM UNDER ANESTHESIA;  Surgeon: Armandina Gemma, MD;  Location: [facility];  Service: General;  Laterality: N/A;  . lasix eye surgery Bilateral   . TONSILLECTOMY         Family History  Problem Relation Age of Onset  . Liver disease Mother        fatty liver- she is overweight  . Heart disease Paternal  Aunt   . Colon cancer Neg Hx   . Colon polyps Neg Hx   . Diabetes Neg Hx   . Kidney disease Neg Hx   . Gallbladder disease Neg Hx   . Esophageal cancer Neg Hx     Social History   Tobacco Use  . Smoking status: Current Every Day Smoker    Packs/day: 1.00    Years: 30.00    Pack years: 30.00    Types: Cigarettes    Start date: 04/13/2017  . Smokeless tobacco: Former Systems developer    Types: Chew  Substance Use Topics  . Alcohol use: Yes    Alcohol/week: 1.0 standard drinks    Types: 1 Cans of beer per week    Comment: occasional  . Drug use: No    Home Medications Prior to Admission medications   Medication Sig Start Date End Date Taking? Authorizing Provider  milk thistle 175 MG tablet Take 175 mg by mouth daily.   Yes [provider]  Multiple Vitamin (DAILY VITAMIN PO) Take 1 packet by mouth daily. MEN'S SPORT VITAMIN PACK.  Omega 3, Grape Seed Extract, Milk Thistle Extract, Tribulus Terrestris, Vit D3.   Yes [provider]  Omega-3 Fatty Acids (FISH OIL PO) Take 1 capsule by mouth daily.   Yes [provider]  pantoprazole (PROTONIX) 40 MG tablet Take 1 tablet (40 mg total) by mouth daily. 04/03/19  Yes [name], NP  Probiotic Product (PROBIOTIC DAILY) CAPS Take by mouth daily.   Yes [provider]  Red Yeast Rice Extract (RED YEAST RICE PO) Take 1 tablet by mouth daily.   Yes [provider]  atorvastatin (LIPITOR) 20 MG tablet Take 1 tablet (20 mg total) by mouth daily. 04/06/19 05/20/19  [name], NP    Allergies    [name].  Review of Systems   Review of Systems  Constitutional: Positive for activity change. Negative for chills and fever.  Gastrointestinal: Positive for rectal pain.  Skin: Positive for rash.  Allergic/Immunologic: Negative for immunocompromised state.    Physical Exam Updated Vital Signs BP 132/83   Pulse 93   Temp 97.8 F (36.6 C) (Oral)   Resp 18   SpO2 97%     Physical Exam Vitals and nursing note reviewed.  Constitutional:      Appearance: He is well-developed.  HENT:     Head: Atraumatic.  Cardiovascular:     Rate and Rhythm: Normal rate.  Pulmonary:     Effort: Pulmonary effort is normal.  Abdominal:     Comments: Perianal   Musculoskeletal:     Cervical back: Neck supple.  Skin:    General: Skin is warm.  Neurological:     Mental Status: He is alert and oriented to person, place, and time.       ED Results / Procedures / Treatments   Labs (all labs ordered are listed, but only abnormal results are displayed) Labs Reviewed  CBC WITH DIFFERENTIAL/PLATELET - Abnormal; Notable for the following components:      Result Value   WBC 13.2 (*)    Neutro Abs 9.9 (*)    Monocytes Absolute 1.3 (*)    All other components within normal limits  RESPIRATORY PANEL BY RT PCR (FLU A&B, COVID)  BASIC METABOLIC PANEL    EKG None  Radiology No results found.  Procedures Procedures (including critical care time)  Medications Ordered in ED Medications  Ampicillin-Sulbactam (UNASYN) 3 g in sodium chloride 0.9 % 100 mL IVPB (0 g Intravenous Stopped 07/28/19 1909)  morphine 4 MG/ML injection 8 mg (8 mg Intravenous Given 07/28/19 1839)    ED Course  I have reviewed the triage vital signs and the nursing notes.  Pertinent labs & imaging results that were available during my care of the patient were reviewed by me and considered in my medical decision making (see chart for details).    MDM Rules/Calculators/A&P                       Patient comes in a chief complaint of rectal pain.  Likely perirectal abscess.  General surgery consulted and Dr. Ezzard Standing will see the patient.  IV Unasyn started.  Patient is clinically not septic.  Final Clinical Impression(s) / ED Diagnoses Final diagnoses:  Perirectal abscess    Rx / DC Orders ED Discharge Orders    None       Derwood Kaplan, MD 07/28/19 2028

## 2019-07-28 NOTE — H&P (Signed)
Re:   Gregory Fowler DOB:   [birth year] MRN:   [id number]  Chief Complaint Rectal pain  ASSESEMENT AND PLAN: 1.  Rectal pain, probable perianal/rectal abscess  I offered to go to the OR tonight to do an EUA and I&D for his rectal pain, which I expect to find he has a posterior perianal/rectal abscess.  But he wants a CT scan prior to any operative procedure.  I will order the CT scan and place him in observation, with plans that if an abscess is seen, he may go to the OR tomorrow.  Dr. Redmond Pulling is our surgeon of the week and I will communicate this to him in the AM.  2.  Smokes 1 ppd 3.  History of kidney stones. 4.  Working two jobs which is limiting the amount of sleep he is getting.  Chief Complaint  Patient presents with  . Recurrent Skin Infections   PHYSICIAN REQUESTING CONSULTATION:  Carney Corners, MD, Karenann Cai  HISTORY OF PRESENT ILLNESS: Gregory Fowler is a 43 y.o. (DOB: March 20, 1976)  white male whose primary care physician is [name], NP.  He is by himself.   He had a posterior perianal abscess drained by Dr. Harlow Asa on 04/18/2019.  He saw Puja once in our office for follow up on 05/05/2019.  He had no problems until about 2 days ago, when he started experiencing perirectal pain again.  He also developed constipation.  He's had no history of stomach, liver, or colon disease.  He has no history of inflammatory bowel disease.  He is pretty focused on a CT scan prior to any operative procedure.    Past Medical History:  Diagnosis Date  . Abdominal pain, other specified site   . Backache, unspecified   . Fatty liver   . Gastroparesis   . GERD (gastroesophageal reflux disease)   . Hiatal hernia   . Kidney stone   . Nonspecific elevation of levels of transaminase or lactic acid dehydrogenase (LDH)       Past Surgical History:  Procedure Laterality Date  . INCISION AND DRAINAGE PERIRECTAL ABSCESS N/A 04/18/2019   Procedure: IRRIGATION AND DEBRIDEMENT  PERIRECTAL ABSCESS EXAM UNDER ANESTHESIA;  Surgeon: Armandina Gemma, MD;  Location: [facility];  Service: General;  Laterality: N/A;  . lasix eye surgery Bilateral   . TONSILLECTOMY        Current Facility-Administered Medications  Medication Dose Route Frequency Provider Last Rate Last Admin  . Ampicillin-Sulbactam (UNASYN) 3 g in sodium chloride 0.9 % 100 mL IVPB  3 g Intravenous Once Varney Biles, MD 200 mL/hr at 07/28/19 1839 3 g at 07/28/19 1839   Current Outpatient Medications  Medication Sig Dispense Refill  . milk thistle 175 MG tablet Take 175 mg by mouth daily.    . Multiple Vitamin (DAILY VITAMIN PO) Take 1 packet by mouth daily. MEN'S SPORT VITAMIN PACK.  Omega 3, Grape Seed Extract, Milk Thistle Extract, Tribulus Terrestris, Vit D3.    . Omega-3 Fatty Acids (FISH OIL PO) Take 1 capsule by mouth daily.    . pantoprazole (PROTONIX) 40 MG tablet Take 1 tablet (40 mg total) by mouth daily. 90 tablet 1  . Probiotic Product (PROBIOTIC DAILY) CAPS Take by mouth daily.    . Red Yeast Rice Extract (RED YEAST RICE PO) Take 1 tablet by mouth daily.       No Known Allergies  REVIEW OF SYSTEMS: Skin:  No history of rash.  No history of abnormal moles. Infection:  No history of hepatitis or HIV.  No history of MRSA. Neurologic:  No history of stroke.  No history of seizure.  No history of headaches. Cardiac:  No history of hypertension. No history of heart disease.  No history of prior cardiac catheterization.  No history of seeing a cardiologist. Pulmonary:  Smokes  Endocrine:  No diabetes. No thyroid disease. Gastrointestinal:  See HPI Urologic:  History of kidney stones. Musculoskeletal:  No history of joint or back disease. Hematologic:  No bleeding disorder.  No history of anemia.  Not anticoagulated. Psycho-social:  The patient is oriented.   The patient has no obvious psychologic or social impairment to understanding our conversation and plan.  SOCIAL and FAMILY  HISTORY: Married.  Wife Charlene.  I spoke to her on the phone.  He has two children, 20 yo and 90 yo.  He works two jobs - full time.  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  On nights and the weekends, he works at [employer].  He is not getting enough sleep or rest.  PHYSICAL EXAM: BP 131/89   Pulse (!) 108   Temp 97.8 F (36.6 C) (Oral)   Resp 18   SpO2 98%   General: WN WM who is alert and generally healthy appearing.  Skin:  Inspection and palpation - no mass or rash. Eyes:  Conjunctiva and lids unremarkable.            Pupils are equal Ears, Nose, Mouth, and Throat:  Ears and nose unremarkable            Lips and teeth are unremarable. Neck: Supple. No mass, trachea midline.  No thyroid mass. Lymph Nodes:  No supraclavicular, cervical, or inguinal nodes. Lungs: Normal respiratory effort.  Clear to auscultation and symmetric breath sounds. Heart:  Palpation of the heart is normal.            Auscultation: RRR. No murmur or rub.  Abdomen: Soft. No mass. No tenderness. No hernia.             Normal bowel sounds.  No abdominal scars. Rectal: Posterior anal pain and tenderness.  There is not an obvious mass and he will not allow much of a rectal exam. Musculoskeletal:  Good muscle strength and ROM  in upper and lower extremities.  Neurologic:  Grossly intact to motor and sensory function. Psychiatric: Normal judgement and insight. Behavior is normal.            Oriented to time, person, place.   DATA REVIEWED, COUNSELING AND COORDINATION OF CARE: Epic notes reviewed. Counseling and coordination of care exceeded more than 50% of the time spent with patient. Total time spent with patient and charting: 45 minutes.  Ovidio Kin, MD,  [facility] [address] [place]  [address]   [place], [place]    [address] Phone:  [phone] FAX:  [phone]

## 2019-07-29 ENCOUNTER — Encounter (HOSPITAL_COMMUNITY): Admission: EM | Disposition: A | Discharge status: Home / Self Care | Payer: Self-pay | Source: Home / Self Care

## 2019-07-29 ENCOUNTER — Observation Stay (HOSPITAL_COMMUNITY): Payer: Managed Care, Other (non HMO) | Admitting: Anesthesiology

## 2019-07-29 ENCOUNTER — Encounter (HOSPITAL_COMMUNITY): Payer: Self-pay | Admitting: General Practice

## 2019-07-29 ENCOUNTER — Other Ambulatory Visit: Payer: Self-pay

## 2019-07-29 DIAGNOSIS — Z20828 Contact with and (suspected) exposure to other viral communicable diseases: Secondary | ICD-10-CM | POA: Diagnosis present

## 2019-07-29 DIAGNOSIS — K3184 Gastroparesis: Secondary | ICD-10-CM | POA: Diagnosis present

## 2019-07-29 DIAGNOSIS — K219 Gastro-esophageal reflux disease without esophagitis: Secondary | ICD-10-CM | POA: Diagnosis present

## 2019-07-29 DIAGNOSIS — K760 Fatty (change of) liver, not elsewhere classified: Secondary | ICD-10-CM | POA: Diagnosis present

## 2019-07-29 DIAGNOSIS — K644 Residual hemorrhoidal skin tags: Secondary | ICD-10-CM | POA: Diagnosis present

## 2019-07-29 DIAGNOSIS — K5900 Constipation, unspecified: Secondary | ICD-10-CM | POA: Diagnosis present

## 2019-07-29 DIAGNOSIS — Z79899 Other long term (current) drug therapy: Secondary | ICD-10-CM | POA: Diagnosis not present

## 2019-07-29 DIAGNOSIS — Z87442 Personal history of urinary calculi: Secondary | ICD-10-CM | POA: Diagnosis not present

## 2019-07-29 DIAGNOSIS — K648 Other hemorrhoids: Secondary | ICD-10-CM | POA: Diagnosis present

## 2019-07-29 DIAGNOSIS — K611 Rectal abscess: Secondary | ICD-10-CM | POA: Diagnosis present

## 2019-07-29 DIAGNOSIS — F17210 Nicotine dependence, cigarettes, uncomplicated: Secondary | ICD-10-CM | POA: Diagnosis present

## 2019-07-29 HISTORY — PX: INCISION AND DRAINAGE PERIRECTAL ABSCESS: SHX1804

## 2019-07-29 LAB — BASIC METABOLIC PANEL
Anion gap: 9 (ref 5–15)
BUN: 14 mg/dL (ref 6–20)
CO2: 24 mmol/L (ref 22–32)
Calcium: 8.7 mg/dL — ABNORMAL LOW (ref 8.9–10.3)
Chloride: 105 mmol/L (ref 98–111)
Creatinine, Ser: 1.01 mg/dL (ref 0.61–1.24)
GFR calc Af Amer: >60 mL/min (ref >60)
GFR calc non Af Amer: >60 mL/min (ref >60)
Glucose, Bld: 119 mg/dL — ABNORMAL HIGH (ref 70–99)
Potassium: 4 mmol/L (ref 3.5–5.1)
Sodium: 138 mmol/L (ref 135–145)

## 2019-07-29 LAB — CBC
HCT: 40.7 % (ref 39.0–52.0)
Hemoglobin: 13.7 g/dL (ref 13.0–17.0)
MCH: 29.1 pg (ref 26.0–34.0)
MCHC: 33.7 g/dL (ref 30.0–36.0)
MCV: 86.4 fL (ref 80.0–100.0)
Platelets: 175 10*3/uL (ref 150–400)
RBC: 4.71 MIL/uL (ref 4.22–5.81)
RDW: 13.2 % (ref 11.5–15.5)
WBC: 14.4 10*3/uL — ABNORMAL HIGH (ref 4.0–10.5)
nRBC: 0 % (ref 0.0–0.2)

## 2019-07-29 LAB — SURGICAL PCR SCREEN
MRSA, PCR: NEGATIVE
Staphylococcus aureus: NEGATIVE

## 2019-07-29 SURGERY — INCISION AND DRAINAGE, ABSCESS, PERIRECTAL
Anesthesia: General | Site: Rectum

## 2019-07-29 MED ORDER — OXYCODONE HCL 5 MG PO TABS
5.0000 mg | ORAL_TABLET | ORAL | Status: DC | PRN
  Administered 2019-07-29 – 2019-07-30 (×3): 10 mg via ORAL
  Filled 2019-07-29 (×3): qty 2

## 2019-07-29 MED ORDER — SCOPOLAMINE 1 MG/3DAYS TD PT72
1.0000 | MEDICATED_PATCH | TRANSDERMAL | Status: DC
  Administered 2019-07-29: 1.5 mg via TRANSDERMAL
  Filled 2019-07-29: qty 1

## 2019-07-29 MED ORDER — ONDANSETRON 4 MG PO TBDP: 4.0000 mg | ORAL_TABLET | Freq: Four times a day (QID) | ORAL | Status: DC | PRN

## 2019-07-29 MED ORDER — 0.9 % SODIUM CHLORIDE (POUR BTL) OPTIME
TOPICAL | Status: DC | PRN
  Administered 2019-07-29: 1000 mL

## 2019-07-29 MED ORDER — HYDROGEN PEROXIDE 3 % EX SOLN
CUTANEOUS | Status: AC
  Filled 2019-07-29: qty 473

## 2019-07-29 MED ORDER — GABAPENTIN 300 MG PO CAPS
300.0000 mg | ORAL_CAPSULE | Freq: Three times a day (TID) | ORAL | Status: DC
  Administered 2019-07-29 – 2019-07-30 (×4): 300 mg via ORAL
  Filled 2019-07-29 (×4): qty 1

## 2019-07-29 MED ORDER — GABAPENTIN 300 MG PO CAPS
300.0000 mg | ORAL_CAPSULE | ORAL | Status: AC
  Administered 2019-07-29: 300 mg via ORAL
  Filled 2019-07-29: qty 1

## 2019-07-29 MED ORDER — PROPOFOL 10 MG/ML IV BOLUS
INTRAVENOUS | Status: DC | PRN
  Administered 2019-07-29: 200 mg via INTRAVENOUS

## 2019-07-29 MED ORDER — MUPIROCIN 2 % EX OINT
1.0000 "application " | TOPICAL_OINTMENT | Freq: Two times a day (BID) | CUTANEOUS | Status: DC
  Administered 2019-07-29: 1 via NASAL
  Filled 2019-07-29: qty 22

## 2019-07-29 MED ORDER — FENTANYL CITRATE (PF) 100 MCG/2ML IJ SOLN: 25.0000 ug | INTRAMUSCULAR | Status: DC | PRN

## 2019-07-29 MED ORDER — MIDAZOLAM HCL 2 MG/2ML IJ SOLN
INTRAMUSCULAR | Status: DC | PRN
  Administered 2019-07-29: 2 mg via INTRAVENOUS

## 2019-07-29 MED ORDER — LIDOCAINE 2% (20 MG/ML) 5 ML SYRINGE
INTRAMUSCULAR | Status: DC | PRN
  Administered 2019-07-29: 60 mg via INTRAVENOUS

## 2019-07-29 MED ORDER — HYDROGEN PEROXIDE 3 % EX SOLN
CUTANEOUS | Status: DC | PRN
  Administered 2019-07-29: 1

## 2019-07-29 MED ORDER — FENTANYL CITRATE (PF) 250 MCG/5ML IJ SOLN
INTRAMUSCULAR | Status: AC
  Filled 2019-07-29: qty 5

## 2019-07-29 MED ORDER — MIDAZOLAM HCL 2 MG/2ML IJ SOLN
INTRAMUSCULAR | Status: AC
  Filled 2019-07-29: qty 2

## 2019-07-29 MED ORDER — MORPHINE SULFATE (PF) 2 MG/ML IV SOLN
2.0000 mg | INTRAVENOUS | Status: DC | PRN
  Administered 2019-07-30: 2 mg via INTRAVENOUS
  Filled 2019-07-29: qty 1

## 2019-07-29 MED ORDER — ENOXAPARIN SODIUM 40 MG/0.4ML ~~LOC~~ SOLN
40.0000 mg | SUBCUTANEOUS | Status: DC
  Administered 2019-07-30: 40 mg via SUBCUTANEOUS
  Filled 2019-07-29: qty 0.4

## 2019-07-29 MED ORDER — ACETAMINOPHEN 500 MG PO TABS
1000.0000 mg | ORAL_TABLET | Freq: Three times a day (TID) | ORAL | Status: DC
  Administered 2019-07-29 – 2019-07-30 (×3): 1000 mg via ORAL
  Filled 2019-07-29 (×3): qty 2

## 2019-07-29 MED ORDER — NICOTINE 14 MG/24HR TD PT24: 14.0000 mg | MEDICATED_PATCH | Freq: Every day | TRANSDERMAL | Status: DC | PRN

## 2019-07-29 MED ORDER — PANTOPRAZOLE SODIUM 40 MG PO TBEC
40.0000 mg | DELAYED_RELEASE_TABLET | Freq: Two times a day (BID) | ORAL | Status: DC
  Administered 2019-07-29 – 2019-07-30 (×2): 40 mg via ORAL
  Filled 2019-07-29 (×3): qty 1

## 2019-07-29 MED ORDER — BUPIVACAINE LIPOSOME 1.3 % IJ SUSP
20.0000 mL | Freq: Once | INTRAMUSCULAR | Status: AC
  Administered 2019-07-29: 20 mL
  Filled 2019-07-29: qty 20

## 2019-07-29 MED ORDER — KETOROLAC TROMETHAMINE 15 MG/ML IJ SOLN: 15.0000 mg | Freq: Four times a day (QID) | INTRAMUSCULAR | Status: DC | PRN

## 2019-07-29 MED ORDER — PROMETHAZINE HCL 25 MG/ML IJ SOLN: 6.2500 mg | INTRAMUSCULAR | Status: DC | PRN

## 2019-07-29 MED ORDER — PROPOFOL 10 MG/ML IV BOLUS
INTRAVENOUS | Status: AC
  Filled 2019-07-29: qty 20

## 2019-07-29 MED ORDER — ONDANSETRON HCL 4 MG/2ML IJ SOLN: 4.0000 mg | Freq: Four times a day (QID) | INTRAMUSCULAR | Status: DC | PRN

## 2019-07-29 MED ORDER — METHOCARBAMOL 500 MG PO TABS
500.0000 mg | ORAL_TABLET | Freq: Three times a day (TID) | ORAL | Status: DC
  Administered 2019-07-29 – 2019-07-30 (×4): 500 mg via ORAL
  Filled 2019-07-29 (×4): qty 1

## 2019-07-29 MED ORDER — ACETAMINOPHEN 500 MG PO TABS
1000.0000 mg | ORAL_TABLET | ORAL | Status: AC
  Administered 2019-07-29: 1000 mg via ORAL
  Filled 2019-07-29: qty 2

## 2019-07-29 MED ORDER — FENTANYL CITRATE (PF) 250 MCG/5ML IJ SOLN
INTRAMUSCULAR | Status: DC | PRN
  Administered 2019-07-29 (×3): 50 ug via INTRAVENOUS

## 2019-07-29 MED ORDER — KETOROLAC TROMETHAMINE 15 MG/ML IJ SOLN
15.0000 mg | Freq: Four times a day (QID) | INTRAMUSCULAR | Status: AC
  Administered 2019-07-29: 15 mg via INTRAVENOUS
  Filled 2019-07-29: qty 1

## 2019-07-29 MED ORDER — DEXAMETHASONE SODIUM PHOSPHATE 10 MG/ML IJ SOLN
INTRAMUSCULAR | Status: DC | PRN
  Administered 2019-07-29: 10 mg via INTRAVENOUS

## 2019-07-29 MED ORDER — CELECOXIB 200 MG PO CAPS: 400.0000 mg | ORAL_CAPSULE | Freq: Once | ORAL | Status: DC

## 2019-07-29 MED ORDER — LACTATED RINGERS IV SOLN: INTRAVENOUS | Status: DC

## 2019-07-29 MED ORDER — ACETAMINOPHEN 500 MG PO TABS: 1000.0000 mg | ORAL_TABLET | Freq: Once | ORAL | Status: DC

## 2019-07-29 SURGICAL SUPPLY — 39 items
BENZOIN TINCTURE PRP APPL 2/3 (GAUZE/BANDAGES/DRESSINGS) IMPLANT
COVER SURGICAL LIGHT HANDLE (MISCELLANEOUS) ×2 IMPLANT
COVER WAND RF STERILE (DRAPES) IMPLANT
DECANTER SPIKE VIAL GLASS SM (MISCELLANEOUS) IMPLANT
DERMABOND ADVANCED (GAUZE/BANDAGES/DRESSINGS)
DERMABOND ADVANCED .7 DNX12 (GAUZE/BANDAGES/DRESSINGS) IMPLANT
DRAPE LAPAROSCOPIC ABDOMINAL (DRAPES) IMPLANT
DRAPE LAPAROTOMY T 102X78X121 (DRAPES) IMPLANT
DRAPE LAPAROTOMY T 98X78 PEDS (DRAPES) ×1 IMPLANT
DRAPE LAPAROTOMY TRNSV 102X78 (DRAPES) IMPLANT
DRAPE SHEET LG 3/4 BI-LAMINATE (DRAPES) ×1 IMPLANT
DRAPE UTILITY XL STRL (DRAPES) ×2 IMPLANT
DRSG PAD ABDOMINAL 8X10 ST (GAUZE/BANDAGES/DRESSINGS) ×1 IMPLANT
ELECT REM PT RETURN 15FT ADLT (MISCELLANEOUS) ×2 IMPLANT
GAUZE PACKING IODOFORM 1/4X15 (GAUZE/BANDAGES/DRESSINGS) IMPLANT
GAUZE PACKING IODOFORM 1X5 (MISCELLANEOUS) ×1 IMPLANT
GAUZE SPONGE 4X4 12PLY STRL (GAUZE/BANDAGES/DRESSINGS) ×2 IMPLANT
GLOVE BIO SURGEON STRL SZ7.5 (GLOVE) ×2 IMPLANT
GLOVE INDICATOR 8.0 STRL GRN (GLOVE) ×2 IMPLANT
GOWN STRL REUS W/TWL XL LVL3 (GOWN DISPOSABLE) ×4 IMPLANT
IV CATH 14GX2 1/4 (CATHETERS) ×1 IMPLANT
KIT BASIN OR (CUSTOM PROCEDURE TRAY) ×2 IMPLANT
KIT TURNOVER KIT A (KITS) IMPLANT
LEGGING LITHOTOMY PAIR STRL (DRAPES) ×1 IMPLANT
MARKER SKIN DUAL TIP RULER LAB (MISCELLANEOUS) IMPLANT
NDL HYPO 25X1 1.5 SAFETY (NEEDLE) ×1 IMPLANT
NEEDLE HYPO 25X1 1.5 SAFETY (NEEDLE) ×4 IMPLANT
PACK GENERAL/GYN (CUSTOM PROCEDURE TRAY) ×2 IMPLANT
PENCIL SMOKE EVACUATOR (MISCELLANEOUS) IMPLANT
SPONGE LAP 18X18 RF (DISPOSABLE) IMPLANT
SPONGE LAP 4X18 RFD (DISPOSABLE) IMPLANT
STAPLER VISISTAT 35W (STAPLE) IMPLANT
SUT MNCRL AB 4-0 PS2 18 (SUTURE) IMPLANT
SUT VIC AB 3-0 SH 18 (SUTURE) IMPLANT
SWAB COLLECTION DEVICE MRSA (MISCELLANEOUS) ×1 IMPLANT
SWAB CULTURE ESWAB REG 1ML (MISCELLANEOUS) ×1 IMPLANT
SYR CONTROL 10ML LL (SYRINGE) ×2 IMPLANT
TOWEL OR 17X26 10 PK STRL BLUE (TOWEL DISPOSABLE) ×2 IMPLANT
TOWEL OR NON WOVEN STRL DISP B (DISPOSABLE) ×2 IMPLANT

## 2019-07-29 NOTE — Op Note (Signed)
NAME: Gregory Fowler, VANGIESON MEDICAL RECORD WU:[id number] ACCOUNT [id number] DATE OF BIRTH:[birth year] FACILITY: [facility] LOCATION: [facility] PHYSICIAN:Indie Boehne Ronnie Derby, MD  OPERATIVE REPORT  DATE OF PROCEDURE:  07/29/2019  PREOPERATIVE DIAGNOSIS:  Recurrent posterior perirectal abscess.  POSTOPERATIVE DIAGNOSES: 1.  Recurrent posterior perirectal abscess. 2.  Left and right lateral internal/external hemorrhoids.  PROCEDURES:   1.  Exam under anesthesia, anoscopy 2.  Incision & drainage of recurrent posterior perirectal abscess with scalpel.  SURGEON:  Leighton Ruff. Redmond Pulling, MD   ANESTHESIA:  General with LMA plus an Exparel block.  SPECIMENS:  Aerobic, anaerobic and Gram stain cultures of the abscess cavity.  INDICATIONS:  The patient is a 43 year old gentleman who represented to the hospital last night with several days of a perirectal posterior discomfort.  On exam, it was felt that he had a recurrent posterior perirectal abscess.  He requested CT imaging,  which confirmed a recurrent posterior rectal abscess.  On CT, it measured 2.9 x 3.2 cm x 2.9 cm and it was irregular, multilobulated, rim enhancing involving the posterior levator plate, extending from the 4 o'clock to 8 o'clock position with asymmetric  thickening along the left intersphincteric plane.  The patient underwent an incision and drainage of a posterior perirectal abscess on 04/18/2019.  We recommended repeat exam under anesthesia today along with incision and drainage as well as to look for  possible signs of a fistula.  I explained that when he has a recurrent abscess in the same location that raises the possibility of fistula.  We discussed that we may not be able to demonstrate it today given the surrounding inflammation.  I previously  discussed the risks and benefits of the procedure, which are separately documented.  DESCRIPTION OF PROCEDURE:  The patient was given oral Tylenol and gabapentin preprocedure.  He was on  broad-spectrum therapeutic antibiotics.  He was brought to OR [facility] at [facility] and placed supine on the operating room table.  General LMA  anesthesia was established.  Sequential compression devices were placed.  He was then placed in the lithotomy position with the appropriate padding.  His perineum was prepped and draped in the usual standard surgical fashion with Betadine.  Surgical  timeout was performed.  Visual inspection of the perineum revealed engorged external hemorrhoidal tissue in the left lateral, right lateral position.  It was not technically prolapsed.  There was an area of bogginess and fullness in the posterior midline.  This is where it has  been previously incised and drained.  I aspirated the area.  I got at least 10 mL of a tan purulent fluid.  This was placed in the culture medium.  Anoscopy was then performed, confirming left and right lateral internal and external hemorrhoidal tissue.   I could not identify any bulge or abscess within the anal canal.  Then using a scalpel, I excised a small ellipse of skin in the posterior midline.  There was frank drainage of tan purulent material.  The excision was 1 inch long.  The abscess cavity  was similar in size to what Dr. Harlow Asa had noted back in September.  It was approximately a 4 cm x 3 cm x 4 cm.  I explored it digitally.  It did not directly connect to the anal canal or rectum.  I then injected a mixture of hydrogen peroxide and saline  into the abscess cavity  while looking in the anus and rectum with the anoscope.  I did this 3 times and I could  not demonstrate any hydrogen peroxide mixture coming into the anal canal and rectum.  Therefore, I did not continue to probe the area given  the inflammation in the area.  Hemostasis was achieved with electrocautery.  I then did a regional block with 20 mL of Exparel.  One inch iodoform packing was placed in the abscess cavity, followed by dry gauze and mesh underwear.  All needle,  instrument  and sponge counts were correct x2.  There were no immediate complications.  The patient tolerated the procedure well.  VN/NUANCE  D:07/29/2019 T:07/29/2019 JOB:[id number]

## 2019-07-29 NOTE — Plan of Care (Signed)
Plan of care discussed.   

## 2019-07-29 NOTE — Anesthesia Procedure Notes (Addendum)
Procedure Name: LMA Insertion Date/Time: 07/29/2019 1:43 PM Performed by: West Pugh, CRNA Pre-anesthesia Checklist: Patient identified, Emergency Drugs available, Suction available, Patient being monitored and Timeout performed Patient Re-evaluated:Patient Re-evaluated prior to induction Oxygen Delivery Method: Circle system utilized Preoxygenation: Pre-oxygenation with 100% oxygen Induction Type: IV induction LMA: LMA with gastric port inserted LMA Size: 4.0 Number of attempts: 1 Placement Confirmation: positive ETCO2 and breath sounds checked- equal and bilateral Tube secured with: Tape Dental Injury: Teeth and Oropharynx as per pre-operative assessment

## 2019-07-29 NOTE — Plan of Care (Signed)
Continue current POC 

## 2019-07-29 NOTE — Anesthesia Postprocedure Evaluation (Signed)
Anesthesia Post Note  Patient: [name]  Procedure(s) Performed: IRRIGATION AND DEBRIDEMENT PERIRECTAL ABSCESS (N/A Rectum)     Patient location during evaluation: PACU Anesthesia Type: General Level of consciousness: awake and alert Pain management: pain level controlled Vital Signs Assessment: post-procedure vital signs reviewed and stable Respiratory status: spontaneous breathing, nonlabored ventilation and respiratory function stable Cardiovascular status: blood pressure returned to baseline and stable Postop Assessment: no apparent nausea or vomiting Anesthetic complications: no    Last Vitals:  Vitals:   07/29/19 1550 07/29/19 1819  BP: 102/70 122/72  Pulse: 67 70  Resp: 20 16  Temp: 36.7 C   SpO2: 91% 96%    Last Pain:  Vitals:   07/29/19 1933  TempSrc:   PainSc: 8                  Lidia Collum

## 2019-07-29 NOTE — Anesthesia Preprocedure Evaluation (Addendum)
Anesthesia Evaluation  Patient identified by MRN, date of birth, ID band Patient awake    Reviewed: Allergy & Precautions, NPO status , Patient's Chart, lab work & pertinent test results  Airway Mallampati: III  TM Distance: >3 FB Neck ROM: Full    Dental no notable dental hx.    Pulmonary Current Smoker and Patient abstained from smoking.,  30 pack year history   Pulmonary exam normal breath sounds clear to auscultation       Cardiovascular negative cardio ROS Normal cardiovascular exam Rhythm:Regular Rate:Normal  ECG: ST, rate 105   Neuro/Psych PSYCHIATRIC DISORDERS Anxiety negative neurological ROS     GI/Hepatic hiatal hernia, GERD  Medicated and Controlled,Fatty liver Gastroparesis    Endo/Other  negative endocrine ROS  Renal/GU Renal disease     Musculoskeletal negative musculoskeletal ROS (+)   Abdominal   Peds  Hematology negative hematology ROS (+)   Anesthesia Other Findings perirectal abcess  Reproductive/Obstetrics                            Anesthesia Physical Anesthesia Plan  ASA: II  Anesthesia Plan: General   Post-op Pain Management:    Induction: Intravenous  PONV Risk Score and Plan: 1 and Ondansetron, Dexamethasone, Treatment may vary due to age or medical condition and Midazolam  Airway Management Planned: LMA  Additional Equipment: None  Intra-op Plan:   Post-operative Plan: Extubation in OR  Informed Consent: I have reviewed the patients History and Physical, chart, labs and discussed the procedure including the risks, benefits and alternatives for the proposed anesthesia with the patient or authorized representative who has indicated his/her understanding and acceptance.     Dental advisory given  Plan Discussed with: CRNA  Anesthesia Plan Comments:       Anesthesia Quick Evaluation

## 2019-07-29 NOTE — H&P (View-Only) (Signed)
Patient ID: Gregory Fowler, male   DOB: [birth year], 43 y.o.   MRN: [id number]   Acute Care Surgery Service Progress Note:    Chief Complaint/Subjective: Still having pain Able to get some rest.  Works up 90 hrs a week Ask questions about why this recurred  Objective: Vital signs in last 24 hours: Temp:  [97.8 F (36.6 C)-99.3 F (37.4 C)] 98.8 F (37.1 C) (12/16 0829) Pulse Rate:  [40-108] 88 (12/16 0829) Resp:  [16-20] 20 (12/16 0829) BP: (94-152)/(59-95) 119/68 (12/16 0829) SpO2:  [91 %-100 %] 94 % (12/16 0627) Weight:  [99.7 kg] 99.7 kg (12/16 0150) Last BM Date: 07/28/19  Intake/Output from previous day: 12/15 0701 - 12/16 0700 In: 910 [P.O.:10; I.V.:800; IV Piggyback:100] Out: 0  Intake/Output this shift: Total I/O In: -  Out: 700 [Urine:700]  Lungs: nonlabored  Cardiovascular: reg  Deferred rectal exam  Extremities: no edema, +SCDs  Neuro: alert, nonfocal  Lab Results: CBC  Recent Labs    07/28/19 1841 07/29/19 0307  WBC 13.2* 14.4*  HGB 14.5 13.7  HCT 43.2 40.7  PLT 184 175   BMET Recent Labs    07/28/19 1841 07/29/19 0307  NA 138 138  K 3.6 4.0  CL 102 105  CO2 26 24  GLUCOSE 94 119*  BUN 13 14  CREATININE 0.95 1.01  CALCIUM 9.4 8.7*   LFT Hepatic Function Latest Ref Rng & Units 04/17/2019 04/03/2019 11/13/2017  Total Protein 6.5 - 8.1 g/dL 7.1 7.6 7.3  Albumin 3.5 - 5.0 g/dL 3.8 5.0 4.3  AST 15 - 41 U/L 35 21 24  ALT 0 - 44 U/L 47(H) 29 39  Alk Phosphatase 38 - 126 U/L 74 70 69  Total Bilirubin 0.3 - 1.2 mg/dL 0.5 0.4 0.4  Bilirubin, Direct 0.0 - 0.3 mg/dL - 0.1 0.1   PT/INR No results for input(s): LABPROT, INR in the last 72 hours. ABG No results for input(s): PHART, HCO3 in the last 72 hours.  Invalid input(s): PCO2, PO2  Studies/Results:  Anti-infectives: Anti-infectives (From admission, onward)   Start     Dose/Rate Route Frequency Ordered Stop   07/28/19 2200  piperacillin-tazobactam (ZOSYN) IVPB 3.375 g     3.375 g 12.5 mL/hr over 240 Minutes Intravenous Every 8 hours 07/28/19 2102     07/28/19 1815  Ampicillin-Sulbactam (UNASYN) 3 g in sodium chloride 0.9 % 100 mL IVPB     3 g 200 mL/hr over 30 Minutes Intravenous  Once 07/28/19 1806 07/28/19 1909      Medications: Scheduled Meds: Continuous Infusions: . dextrose 5 % and 0.45 % NaCl with KCl 20 mEq/L 100 mL/hr at 07/29/19 0800  . piperacillin-tazobactam (ZOSYN)  IV 3.375 g (07/29/19 0551)   PRN Meds:.HYDROcodone-acetaminophen, morphine injection, ondansetron **OR** ondansetron (ZOFRAN) IV, traMADol  Assessment/Plan: Patient Active Problem List   Diagnosis Date Noted  . Peri-rectal abscess 07/28/2019  . Perirectal abscess 04/17/2019  . Preventative health care 09/08/2014  . Situational anxiety 03/31/2013  . Gastroparesis 10/11/2012  . GERD (gastroesophageal reflux disease) 05/01/2011  . Eructation 05/01/2011  . NAFLD (nonalcoholic fatty liver disease) 05/01/2011  . Back pain 04/08/2011  . Abnormal LFTs 03/03/2011  . TOBACCO USER 07/20/2010   Recurrent peri-rectal abscess  Discussed CT findings with the patient. It appears he has recurrent perirectal abscess in the same location as back in September.  This raises the concern for a fistula in ano  Recommended proceeding to the operating room today for an exam under anesthesia, incision  and drainage of perirectal abscess with possible seton placement  Discussed that I would try to look for a fistula but sometimes given the degree of inflammation it is hard to identify it in the acute setting.  Discussed that since he has a recurrent abscess in the same location this raises the suspicion for a fistula.  Advised him that he will probably need additional procedures and will ultimately have follow-up with our colorectal surgeons  Discussed risk and benefits of surgery including but not limited to bleeding, infection, need for additional procedures, blood clots, perioperative cardiac and  pulmonary events, injury to surrounding structures.  Discussed typical postoperative course.  Discussed postoperative pain control.  Discussed postoperative wound care  Continue IV antibiotic  2 OR later this morning  Disposition:  LOS: 0 days    Leighton Ruff. Redmond Pulling, MD, FACS General, Bariatric, & Minimally Invasive Surgery [phone] [facility]

## 2019-07-29 NOTE — Transfer of Care (Signed)
Immediate Anesthesia Transfer of Care Note  Patient: Gregory Fowler  Procedure(s) Performed: IRRIGATION AND DEBRIDEMENT PERIRECTAL ABSCESS (N/A Rectum)  Patient Location: PACU  Anesthesia Type:General  Level of Consciousness: drowsy and patient cooperative  Airway & Oxygen Therapy: Patient Spontanous Breathing and Patient connected to face mask oxygen  Post-op Assessment: Report given to RN and Post -op Vital signs reviewed and stable  Post vital signs: Reviewed and stable  Last Vitals:  Vitals Value Taken Time  BP 99/63 07/29/19 1435  Temp    Pulse 69 07/29/19 1438  Resp 20 07/29/19 1438  SpO2 93 % 07/29/19 1438  Vitals shown include unvalidated device data.  Last Pain:  Vitals:   07/29/19 1208  TempSrc: Oral  PainSc:       Patients Stated Pain Goal: 3 (57/32/20 2542)  Complications: No apparent anesthesia complications

## 2019-07-29 NOTE — Progress Notes (Signed)
Patient ID: Gregory Fowler, male   DOB: Apr 16, 1976, 42 y.o.   MRN: [id number]   Acute Care Surgery Service Progress Note:    Chief Complaint/Subjective: Still having pain Able to get some rest.  Works up 90 hrs a week Ask questions about why this recurred  Objective: Vital signs in last 24 hours: Temp:  [97.8 F (36.6 C)-99.3 F (37.4 C)] 98.8 F (37.1 C) (12/16 0829) Pulse Rate:  [40-108] 88 (12/16 0829) Resp:  [16-20] 20 (12/16 0829) BP: (94-152)/(59-95) 119/68 (12/16 0829) SpO2:  [91 %-100 %] 94 % (12/16 0627) Weight:  [99.7 kg] 99.7 kg (12/16 0150) Last BM Date: 07/28/19  Intake/Output from previous day: 12/15 0701 - 12/16 0700 In: 910 [P.O.:10; I.V.:800; IV Piggyback:100] Out: 0  Intake/Output this shift: Total I/O In: -  Out: 700 [Urine:700]  Lungs: nonlabored  Cardiovascular: reg  Deferred rectal exam  Extremities: no edema, +SCDs  Neuro: alert, nonfocal  Lab Results: CBC  Recent Labs    07/28/19 1841 07/29/19 0307  WBC 13.2* 14.4*  HGB 14.5 13.7  HCT 43.2 40.7  PLT 184 175   BMET Recent Labs    07/28/19 1841 07/29/19 0307  NA 138 138  K 3.6 4.0  CL 102 105  CO2 26 24  GLUCOSE 94 119*  BUN 13 14  CREATININE 0.95 1.01  CALCIUM 9.4 8.7*   LFT Hepatic Function Latest Ref Rng & Units 04/17/2019 04/03/2019 11/13/2017  Total Protein 6.5 - 8.1 g/dL 7.1 7.6 7.3  Albumin 3.5 - 5.0 g/dL 3.8 5.0 4.3  AST 15 - 41 U/L 35 21 24  ALT 0 - 44 U/L 47(H) 29 39  Alk Phosphatase 38 - 126 U/L 74 70 69  Total Bilirubin 0.3 - 1.2 mg/dL 0.5 0.4 0.4  Bilirubin, Direct 0.0 - 0.3 mg/dL - 0.1 0.1   PT/INR No results for input(s): LABPROT, INR in the last 72 hours. ABG No results for input(s): PHART, HCO3 in the last 72 hours.  Invalid input(s): PCO2, PO2  Studies/Results:  Anti-infectives: Anti-infectives (From admission, onward)   Start     Dose/Rate Route Frequency Ordered Stop   07/28/19 2200  piperacillin-tazobactam (ZOSYN) IVPB 3.375 g     3.375 g 12.5 mL/hr over 240 Minutes Intravenous Every 8 hours 07/28/19 2102     07/28/19 1815  Ampicillin-Sulbactam (UNASYN) 3 g in sodium chloride 0.9 % 100 mL IVPB     3 g 200 mL/hr over 30 Minutes Intravenous  Once 07/28/19 1806 07/28/19 1909      Medications: Scheduled Meds: Continuous Infusions: . dextrose 5 % and 0.45 % NaCl with KCl 20 mEq/L 100 mL/hr at 07/29/19 0800  . piperacillin-tazobactam (ZOSYN)  IV 3.375 g (07/29/19 0551)   PRN Meds:.HYDROcodone-acetaminophen, morphine injection, ondansetron **OR** ondansetron (ZOFRAN) IV, traMADol  Assessment/Plan: Patient Active Problem List   Diagnosis Date Noted  . Peri-rectal abscess 07/28/2019  . Perirectal abscess 04/17/2019  . Preventative health care 09/08/2014  . Situational anxiety 03/31/2013  . Gastroparesis 10/11/2012  . GERD (gastroesophageal reflux disease) 05/01/2011  . Eructation 05/01/2011  . NAFLD (nonalcoholic fatty liver disease) 05/01/2011  . Back pain 04/08/2011  . Abnormal LFTs 03/03/2011  . TOBACCO USER 07/20/2010   Recurrent peri-rectal abscess  Discussed CT findings with the patient. It appears he has recurrent perirectal abscess in the same location as back in September.  This raises the concern for a fistula in ano  Recommended proceeding to the operating room today for an exam under anesthesia, incision  and drainage of perirectal abscess with possible seton placement  Discussed that I would try to look for a fistula but sometimes given the degree of inflammation it is hard to identify it in the acute setting.  Discussed that since he has a recurrent abscess in the same location this raises the suspicion for a fistula.  Advised him that he will probably need additional procedures and will ultimately have follow-up with our colorectal surgeons  Discussed risk and benefits of surgery including but not limited to bleeding, infection, need for additional procedures, blood clots, perioperative cardiac and  pulmonary events, injury to surrounding structures.  Discussed typical postoperative course.  Discussed postoperative pain control.  Discussed postoperative wound care  Continue IV antibiotic  2 OR later this morning  Disposition:  LOS: 0 days    Leighton Ruff. Redmond Pulling, MD, FACS General, Bariatric, & Minimally Invasive Surgery [phone] [facility]

## 2019-07-29 NOTE — Interval H&P Note (Signed)
History and Physical Interval Note:  07/29/2019 12:45 PM  Gregory Fowler  has presented today for surgery, with the diagnosis of perirectal abcess.  The various methods of treatment have been discussed with the patient and family. After consideration of risks, benefits and other options for treatment, the patient has consented to  Procedure(s): IRRIGATION AND DEBRIDEMENT PERIRECTAL ABSCESS (N/A) as a surgical intervention.  The patient's history has been reviewed, patient examined, no change in status, stable for surgery.  I have reviewed the patient's chart and labs.  Questions were answered to the patient's satisfaction.    EUA, I&D perirectal abscess, poss seton placement  [name]

## 2019-07-29 NOTE — Brief Op Note (Signed)
07/29/2019  2:24 PM  PATIENT:  Gregory Fowler  43 y.o. male  PRE-OPERATIVE DIAGNOSIS:  Recurrent POSTERIOR perirectal abcess  POST-OPERATIVE DIAGNOSIS:  Same; LEFT & RIGHT LATERAL INTERNAL/EXTERNAL HEMORRHOIDS  PROCEDURE:  Procedure(s): EXAM UNDER ANESTHESIA, ANOSCOPY, INCISION AND DRAINAGE OF RECURRNENT POSTERIOR PERIRECTAL ABSCESS WITH SCALPEL (N/A)  SURGEON:  Surgeon(s) and Role:    [name], MD - Primary  PHYSICIAN ASSISTANT: NONE  ASSISTANTS: none   ANESTHESIA:   general  EBL:  MINIMAL  BLOOD ADMINISTERED:none  DRAINS: none      LOCAL MEDICATIONS USED:  OTHER EXPAREL  SPECIMEN:  Aspirate  DISPOSITION OF SPECIMEN:  MICRO  COUNTS:  YES  TOURNIQUET:  * No tourniquets in log *  DICTATION: .Other Dictation: Dictation Number [id number]  PLAN OF CARE: Admit to inpatient   PATIENT DISPOSITION:  PACU - hemodynamically stable.   Delay start of Pharmacological VTE agent (>24hrs) due to surgical blood loss or risk of bleeding: no

## 2019-07-30 ENCOUNTER — Encounter: Payer: Self-pay | Admitting: Family

## 2019-07-30 LAB — BASIC METABOLIC PANEL
Anion gap: 9 (ref 5–15)
BUN: 15 mg/dL (ref 6–20)
CO2: 23 mmol/L (ref 22–32)
Calcium: 9.2 mg/dL (ref 8.9–10.3)
Chloride: 102 mmol/L (ref 98–111)
Creatinine, Ser: 1.03 mg/dL (ref 0.61–1.24)
GFR calc Af Amer: >60 mL/min (ref >60)
GFR calc non Af Amer: >60 mL/min (ref >60)
Glucose, Bld: 225 mg/dL — ABNORMAL HIGH (ref 70–99)
Potassium: 4.2 mmol/L (ref 3.5–5.1)
Sodium: 134 mmol/L — ABNORMAL LOW (ref 135–145)

## 2019-07-30 LAB — CBC
HCT: 37.2 % — ABNORMAL LOW (ref 39.0–52.0)
Hemoglobin: 12.4 g/dL — ABNORMAL LOW (ref 13.0–17.0)
MCH: 29 pg (ref 26.0–34.0)
MCHC: 33.3 g/dL (ref 30.0–36.0)
MCV: 86.9 fL (ref 80.0–100.0)
Platelets: 151 10*3/uL (ref 150–400)
RBC: 4.28 MIL/uL (ref 4.22–5.81)
RDW: 12.9 % (ref 11.5–15.5)
WBC: 15.3 10*3/uL — ABNORMAL HIGH (ref 4.0–10.5)
nRBC: 0 % (ref 0.0–0.2)

## 2019-07-30 MED ORDER — IBUPROFEN 400 MG PO TABS: 600.0000 mg | ORAL_TABLET | Freq: Four times a day (QID) | ORAL | Status: DC | PRN

## 2019-07-30 MED ORDER — METHOCARBAMOL 500 MG PO TABS: 500.0000 mg | ORAL_TABLET | Freq: Three times a day (TID) | ORAL | 0 refills | Status: AC

## 2019-07-30 MED ORDER — MORPHINE SULFATE (PF) 4 MG/ML IV SOLN: 3.0000 mg | Freq: Once | INTRAVENOUS | Status: DC

## 2019-07-30 MED ORDER — IBUPROFEN 200 MG PO TABS: ORAL_TABLET | ORAL | Status: DC

## 2019-07-30 MED ORDER — OXYCODONE HCL 5 MG PO TABS: 5.0000 mg | ORAL_TABLET | ORAL | 0 refills | Status: DC | PRN

## 2019-07-30 MED ORDER — ACETAMINOPHEN 500 MG PO TABS: ORAL_TABLET | ORAL | 0 refills | Status: DC

## 2019-07-30 MED ORDER — AMOXICILLIN-POT CLAVULANATE 875-125 MG PO TABS: 1.0000 | ORAL_TABLET | Freq: Two times a day (BID) | ORAL | 0 refills | Status: AC

## 2019-07-30 MED ORDER — AMOXICILLIN-POT CLAVULANATE 875-125 MG PO TABS
1.0000 | ORAL_TABLET | Freq: Two times a day (BID) | ORAL | Status: DC
  Administered 2019-07-30: 1 via ORAL
  Filled 2019-07-30: qty 1

## 2019-07-30 NOTE — Discharge Instructions (Signed)
ANORECTAL SURGERY:  POST OPERATIVE INSTRUCTIONS  ######################################################################  EAT Soft diet After 24 hours, gradually transition to a high fiber diet.    CONTROL PAIN Control pain so you can tolerate bowel movements,  walk, sleep, tolerate sneezing/coughing, and go up/down stairs.   HAVE A BOWEL MOVEMENT DAILY Keep your bowels regular to avoid problems.   Taking a fiber supplement every day to keep bowels soft.   Try a laxative to override constipation. Use an antidairrheal to slow down diarrhea.   Call if not better after 2 tries  WALK Walk an hour a day.  Control your pain to do that.   CALL IF YOU HAVE PROBLEMS/CONCERNS Call if you are still struggling despite following these instructions. Call if you have concerns not answered by these instructions  ######################################################################    1. Take your usually prescribed home medications unless otherwise directed.  2. DIET: Follow a light bland diet & liquids the first 24 hours after arrival home, such as soup, liquids, starches, etc.  Be sure to drink plenty of fluids.  Quickly advance to a usual solid diet within a few days.  Avoid fast food or heavy meals as your are more likely to get nauseated or have irregular bowels.  A low-fat, high-fiber diet for the rest of your life is ideal.  3. PAIN CONTROL: a. Pain is best controlled by a usual combination of three different methods TOGETHER: i. Ice/Heat ii. Over the counter pain medication iii. Prescription pain medication b. Expect swelling and discomfort in the anus/rectal area.  Warm water baths (30-60 minutes up to 6 times a day, especially after bowel meovements) will help. Use ice for the first few days to help decrease swelling and bruising, then switch to heat such as warm towels, sitz baths, warm baths, etc to help relax tight/sore spots and speed recovery.  Some people prefer to use ice  alone, heat alone, alternating between ice & heat.  Experiment to what works for you.   c. It is helpful to take an over-the-counter pain medication continuously for the first few weeks.  Choose one of the following that works best for you: i. Naproxen (Aleve, etc)  Two 220mg  tabs twice a day ii. Ibuprofen (Advil, etc) Three 200mg  tabs four times a day (every meal & bedtime) iii. Acetaminophen (Tylenol, etc) 500-650mg  four times a day (every meal & bedtime) d. A  prescription for pain medication (such as oxycodone, hydrocodone, etc) should be given to you upon discharge.  Take your pain medication as prescribed.  i. If you are having problems/concerns with the prescription medicine (does not control pain, nausea, vomiting, rash, itching, etc), please call us [phone] to see if we need to switch you to a different pain medicine that will work better for you and/or control your side effect better. ii. If you need a refill on your pain medication, please contact your pharmacy.  They will contact our office to request authorization. Prescriptions will not be filled after 5 pm or on week-ends.  If can take up to 48 hours for it to be filled & ready so avoid waiting until you are down to thel ast pill. e. A topical cream (Dibucaine) or a prescription for a cream (such as diltiazem 2% gel) may be given to you.  Many people find relief with topical creams.  Some people find it burns too much.  Experiment.  If it helps, use it.  If it burns, don't using it.  Use a [employer] Bath  4-8 times a day for relief   [employer] can also shower, use soap and water to clean the site.  You can also use hand held nozzle to clean site. A sitz bath is a warm water bath taken in the sitting position that covers only the hips and buttocks. It may be used for either healing or hygiene purposes. Sitz baths are also used to relieve pain, itching, or muscle spasms. The water may contain medicine. Moist heat will help you heal  and relax.  HOME CARE INSTRUCTIONS  Take 3 to 4 sitz baths a day. 1. Fill the bathtub half full with warm water. 2. Sit in the water and open the drain a little. 3. Turn on the warm water to keep the tub half full. Keep the water running constantly. 4. Soak in the water for 15 to 20 minutes. 5. After the sitz bath, pat the affected area dry first.   4. KEEP YOUR BOWELS REGULAR a. The goal is one soft bowel movement a day b. Avoid getting constipated.  Between the surgery and the pain medications, it is common to experience some constipation.  Increasing fluid intake and taking a fiber supplement (such as Metamucil, Citrucel, FiberCon, MiraLax, etc) 2-3 times a day regularly will usually help prevent this problem from occurring.  A mild laxative (prune juice, Milk of Magnesia, MiraLax, etc) should be taken according to package directions if there are no bowel movements after 48 hours. c. Watch out for diarrhea.  If you have many loose bowel movements, simplify your diet to bland foods & liquids for a few days.  Stop any stool softeners and decrease your fiber supplement.  Switching to mild anti-diarrheal medications (Kayopectate, Pepto Bismol) can help.  Can try an imodium/loperamide dose.  If this worsens or does not improve, please call us.  5. Wound Care  a. Remove your bandages with your first bowel movement, usually the day after surgery.  You may have packing if you had an abscess.  Let any packing or gauze fall come out.   b. Wear an absorbent pad or soft cotton balls in your underwear as needed to catch any drainage and help keep the area  c. Keep the area clean and dry.  Bathe / shower every day.  Keep the area clean by showering / bathing over the incision / wound.   It is okay to soak an open wound to help wash it.  Consider using a squeeze bottle filled with warm water to gently wash the anal area.  Wet wipes or showers / gentle washing after bowel movements is often less traumatic than  regular toilet paper. d. Dennis Bast will often notice bleeding with bowel movements.  This should slow down by the end of the first week of surgery.  Sitting on an ice pack can help. e. Expect some drainage.  This should slow down by the end of the first week of surgery, but you will have occasional bleeding or drainage up to a few months after surgery.  Wear an absorbent pad or soft cotton gauze in your underwear until the drainage stops.  6. ACTIVITIES as tolerated:   a. You may resume regular (light) daily activities beginning the next day--such as daily self-care, walking, climbing stairs--gradually increasing activities as tolerated.  If you can walk 30 minutes without difficulty, it is safe to try more intense activity such as jogging, treadmill, bicycling, low-impact aerobics, swimming, etc. b. Save the most intensive and strenuous activity for  last such as sit-ups, heavy lifting, contact sports, etc  Refrain from any heavy lifting or straining until you are off narcotics for pain control.   c. DO NOT PUSH THROUGH PAIN.  Let pain be your guide: If it hurts to do something, don't do it.  Pain is your body warning you to avoid that activity for another week until the pain goes down. d. You may drive when you are no longer taking prescription pain medication, you can comfortably sit for long periods of time, and you can safely maneuver your car and apply brakes. e. Bonita Quin may have sexual intercourse when it is comfortable.  7. FOLLOW UP in our office a. Please call [facility] at [phone] to set up an appointment to see your surgeon in the office for a follow-up appointment approximately 2-3 weeks after your surgery. b. Make sure that you call for this appointment the day you arrive home to ensure a convenient appointment time.  8. IF YOU HAVE DISABILITY OR FAMILY LEAVE FORMS, BRING THEM TO THE OFFICE FOR PROCESSING.  DO NOT GIVE THEM TO YOUR DOCTOR.        WHEN TO CALL us [phone]: 1. Poor pain  control 2. Reactions / problems with new medications (rash/itching, nausea, etc)  3. Fever over 101.5 F (38.5 C) 4. Inability to urinate 5. Nausea and/or vomiting 6. Worsening swelling or bruising 7. Continued bleeding from incision. 8. Increased pain, redness, or drainage from the incision  The clinic staff is available to answer your questions during regular business hours (8:30am-5pm).  Please don't hesitate to call and ask to speak to one of our nurses for clinical concerns.   A surgeon from [facility] is always on call at the hospitals   If you have a medical emergency, go to the nearest emergency room or call 911.    [facility] [address], [address], [place], [place]  [address] ? MAIN: [phone] ? TOLL FREE: [phone] ? FAX [phone] [email or web address]

## 2019-07-30 NOTE — Progress Notes (Signed)
Earnstine Regal, PA gave verbal orders for 3 mg IV Morphine once prior to dressing change.

## 2019-07-30 NOTE — Progress Notes (Signed)
1 Day Post-Op    CC: Rectal pain  Subjective: Patient had I&D yesterday.  He is having extreme pain with attempting to remove the packing.  He is also extremely anxious.  We placed him in a sitz bath and he got the packing out.  There is no erythema around the opening.  I do not anticipate there is any way to get any packing back into there.  Objective: Vital signs in last 24 hours: Temp:  [97.8 F (36.6 C)-98.7 F (37.1 C)] 98.1 F (36.7 C) (12/17 0931) Pulse Rate:  [51-93] 55 (12/17 0931) Resp:  [14-20] 17 (12/17 0931) BP: (99-122)/(62-76) 99/63 (12/17 0931) SpO2:  [89 %-96 %] 95 % (12/17 0931) Last BM Date: 07/28/19 240 p.o. yesterday 300 recorded this a.m. 1459 see 700 urine recorded No BM recorded Afebrile vital signs are stable WBC 15.3 glucose 225 H/H 12.4/37.2 platelets 151,000. CT the pelvis with contrast 12/15: Showed an irregular multilobulated rim perirectal abscess involving the posterior levator plate extending from 4:00 to 8 o'clock position associated with stranding and phlegmonous changes in the ischio anal fat extending to the base of the gluteal cleft with transsphincteric extension given asymptomatic thickening along the left intersphincteric plane.    Intake/Output from previous day: 12/16 0701 - 12/17 0700 In: 1657.4 [P.O.:240; I.V.:1268.3; IV Piggyback:149.1] Out: 700 [Urine:700] Intake/Output this shift: Total I/O In: 300 [P.O.:300] Out: -   General appearance: alert, cooperative and mild distress Skin: Skin around the I&D site what I can see looks fine.  We are working on removing the packing.  Lab Results:  Recent Labs    07/29/19 0307 07/30/19 0248  WBC 14.4* 15.3*  HGB 13.7 12.4*  HCT 40.7 37.2*  PLT 175 151    BMET Recent Labs    07/29/19 0307 07/30/19 0248  NA 138 134*  K 4.0 4.2  CL 105 102  CO2 24 23  GLUCOSE 119* 225*  BUN 14 15  CREATININE 1.01 1.03  CALCIUM 8.7* 9.2   PT/INR No results for input(s): LABPROT, INR in  the last 72 hours.  No results for input(s): AST, ALT, ALKPHOS, BILITOT, PROT, ALBUMIN in the last 168 hours.   Lipase     Component Value Date/Time   LIPASE 32 12/17/2012 1553     Prior to Admission medications   Medication Sig Start Date End Date Taking? Authorizing Provider  milk thistle 175 MG tablet Take 175 mg by mouth daily.   Yes [provider]  Multiple Vitamin (DAILY VITAMIN PO) Take 1 packet by mouth daily. MEN'S SPORT VITAMIN PACK.  Omega 3, Grape Seed Extract, Milk Thistle Extract, Tribulus Terrestris, Vit D3.   Yes [provider]  Omega-3 Fatty Acids (FISH OIL PO) Take 1 capsule by mouth daily.   Yes [provider]  pantoprazole (PROTONIX) 40 MG tablet Take 1 tablet (40 mg total) by mouth daily. 04/03/19  Yes [name], NP  Probiotic Product (PROBIOTIC DAILY) CAPS Take by mouth daily.   Yes [provider]  Red Yeast Rice Extract (RED YEAST RICE PO) Take 1 tablet by mouth daily.   Yes [provider]  atorvastatin (LIPITOR) 20 MG tablet Take 1 tablet (20 mg total) by mouth daily. 04/06/19 05/20/19  [name], NP    Medications: . acetaminophen  1,000 mg Oral Q8H  . enoxaparin (LOVENOX) injection  40 mg Subcutaneous Q24H  . gabapentin  300 mg Oral TID  . methocarbamol  500 mg Oral TID  . pantoprazole  40 mg Oral BID   . lactated ringers 50 mL/hr at 07/29/19 1214  . piperacillin-tazobactam (ZOSYN)  IV 3.375 g (07/30/19 1572)    Assessment/Plan Tobacco use -ongoing Hx nephrolithiasis Working 2 jobs with limited sleep Hx GERD/gastroparesis  Rectal pain Recurrent posterior perirectal abscess; left and right lateral internal/external hemorrhoids  Exam under anesthesia, incision and drainage and excisional debridement of recurrent posterior rectal abscess with scalpel 07/29/2019, Dr. Gaynelle Adu  FEN: IV fluids/regular diet ID: Unasyn 12/15; Zosyn 12/15 >> day 3 DVT: Lovenox [facility]-up: [facility]   Plan: We will convert him over to oral antibiotics.  We talked about using Tylenol/ibuprofen as primary pain relief medications.  We will also send him home with some oxycodone.  He has 2 jobs one he wants to go back to next week 12/21, and the second 1 is more stressful and he wants to wait and go back on 12/28 for that.  Already notes for this.  He is going to wait in the hospital until Dr. Andrey Campanile has an opportunity to speak with him.      LOS: 1 day    Amyla Heffner 07/30/2019 Please see Amion

## 2019-07-30 NOTE — Progress Notes (Addendum)
Pt declined to stay until Dr. Redmond Pulling saw him. Per Earnstine Regal, PA, pt may leave if he doesn't want to wait on Dr. Redmond Pulling.   Pt provided with d/c instructions. After discussing the pt's plan of care, the pt reported no further questions or concerns.  Education provided regarding the use of pain pills and driving. Pt advised not to drive d/t use of PRN pain meds.     Dr. Redmond Pulling saw pt prior to d/c.

## 2019-07-31 ENCOUNTER — Telehealth: Payer: Self-pay | Admitting: *Deleted

## 2019-07-31 NOTE — Telephone Encounter (Signed)
Transition Care Management Follow-up Telephone Call   Date discharged?07/30/19   How have you been since you were released from the hospital? Still constipated   Do you understand why you were in the hospital? yes   Do you understand the discharge instructions? yes   Where were you discharged to? Home with wife and son    Items Reviewed:  Medications reviewed: "They just gave me some pain meds"  Allergies reviewed: yes  Dietary changes reviewed: yes  Referrals reviewed: yes   Functional Questionnaire:   Activities of Daily Living (ADLs):   He states they are independent in the following: ambulation, bathing and hygiene, feeding, continence, grooming, toileting and dressing States they require assistance with the following: na   Any transportation issues/concerns?: no   Any patient concerns? no   Confirmed importance and date/time of follow-up visits scheduled yes  Provider Appointment booked with  Confirmed with patient if condition begins to worsen call PCP or go to the ER.  Patient was given the office number and encouraged to call back with question or concerns.  : yes

## 2019-07-31 NOTE — Telephone Encounter (Signed)
Unable to reach pt

## 2019-07-31 NOTE — Telephone Encounter (Signed)
Addendum--hospital follow up scheduled w/ PCP 08/05/19

## 2019-07-31 NOTE — Telephone Encounter (Signed)
Patient returned your call but you were in a visit.

## 2019-07-31 NOTE — Telephone Encounter (Signed)
Calling back. Please call back.

## 2019-08-01 LAB — AEROBIC/ANAEROBIC CULTURE W GRAM STAIN (SURGICAL/DEEP WOUND)

## 2019-08-04 ENCOUNTER — Other Ambulatory Visit: Payer: Self-pay

## 2019-08-05 ENCOUNTER — Inpatient Hospital Stay: Payer: Self-pay | Admitting: Family

## 2019-08-19 ENCOUNTER — Ambulatory Visit (INDEPENDENT_AMBULATORY_CARE_PROVIDER_SITE_OTHER): Payer: Managed Care, Other (non HMO) | Admitting: Family

## 2019-08-19 ENCOUNTER — Other Ambulatory Visit: Payer: Self-pay

## 2019-08-19 VITALS — Ht 74.0 in | Wt 220.0 lb

## 2019-08-19 DIAGNOSIS — K611 Rectal abscess: Secondary | ICD-10-CM

## 2019-08-19 DIAGNOSIS — G43909 Migraine, unspecified, not intractable, without status migrainosus: Secondary | ICD-10-CM

## 2019-08-19 DIAGNOSIS — R945 Abnormal results of liver function studies: Secondary | ICD-10-CM

## 2019-08-19 DIAGNOSIS — R7989 Other specified abnormal findings of blood chemistry: Secondary | ICD-10-CM

## 2019-08-19 MED ORDER — SUMATRIPTAN SUCCINATE 50 MG PO TABS: ORAL_TABLET | ORAL | 5 refills | Status: DC

## 2019-08-19 NOTE — Progress Notes (Signed)
Virtual Visit via Telephone Note  I connected with Gregory Fowler on 08/19/19 at 11:40 AM EST by telephone and verified that I am speaking with the correct person using two identifiers.  Location: Patient: work Provider: home   I discussed the limitations, risks, security and privacy concerns of performing an evaluation and management service by telephone and the availability of in person appointments. I also discussed with the patient that there may be a patient responsible charge related to this service. The patient expressed understanding and agreed to proceed.   History of Present Illness:  Patient is a 44 year old male who presents today for hospital follow-up.  Hospital records are reviewed.  He initially presented to the emergency department on July 28, 2019.  His chief complaint was rectal pain.  He had previous history of perirectal abscess.  A CT scan was ordered.  CT noted an irregular multilobulated perirectal abscess with associated stranding.  As a result of these findings the patient underwent incision and drainage of perirectal abscess.  Wound ulture grew E. Coli.   He was sent home with abx which he completed.  He reports some mild rectal soreness but otherwise symptoms are improved. Denies fever.  Reports 5-6 week hx of daily "bad headaches."  Pain is in the right frontal area.  Has associated right eye watering and photophobia. Denies associated nausea.  He is having headaches every day.  Taking excedrin extra strength- without significant relief.  Reports HA's 8/10.  Has previous hx of similar HA;s but has not had them in about 2 years.       Past Medical History:  Diagnosis Date  . Abdominal pain, other specified site   . Backache, unspecified   . Fatty liver   . Gastroparesis   . GERD (gastroesophageal reflux disease)   . Hiatal hernia   . Kidney stone   . Nonspecific elevation of levels of transaminase or lactic acid dehydrogenase (LDH)      Social  History   Socioeconomic History  . Marital status: Married    Spouse name: Not on file  . Number of children: 2  . Years of education: Not on file  . Highest education level: Not on file  Occupational History  . Occupation: Film/video editor: [employer]  Tobacco Use  . Smoking status: Current Every Day Smoker    Packs/day: 1.00    Years: 30.00    Pack years: 30.00    Types: Cigarettes    Start date: 04/13/2017  . Smokeless tobacco: Former Neurosurgeon    Types: Chew  Substance and Sexual Activity  . Alcohol use: Yes    Alcohol/week: 1.0 standard drinks    Types: 1 Cans of beer per week    Comment: occasional  . Drug use: No  . Sexual activity: Not on file  Other Topics Concern  . 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   Daughter-2013   Son- 2009   Married   Enjoys playing golf   Completed 12th grade   No DWI.    Social Determinants of Health   Financial Resource Strain:   . Difficulty of Paying Living Expenses: Not on file  Food Insecurity:   . Worried About Programme researcher, broadcasting/film/video in the Last Year: Not on file  . Ran Out of Food in the Last Year: Not on file  Transportation Needs:   .  Lack of Transportation (Medical): Not on file  . Lack of Transportation (Non-Medical): Not on file  Physical Activity:   . Days of Exercise per Week: Not on file  . Minutes of Exercise per Session: Not on file  Stress:   . Feeling of Stress : Not on file  Social Connections:   . Frequency of Communication with Friends and Family: Not on file  . Frequency of Social Gatherings with Friends and Family: Not on file  . Attends Religious Services: Not on file  . Active Member of Clubs or Organizations: Not on file  . Attends Archivist Meetings: Not on file  . Marital Status: Not on file  Intimate Partner Violence:   . Fear of Current or Ex-Partner: Not on file  . Emotionally Abused: Not on file  .  Physically Abused: Not on file  . Sexually Abused: Not on file    Past Surgical History:  Procedure Laterality Date  . INCISION AND DRAINAGE PERIRECTAL ABSCESS N/A 04/18/2019   Procedure: IRRIGATION AND DEBRIDEMENT PERIRECTAL ABSCESS EXAM UNDER ANESTHESIA;  Surgeon: Armandina Gemma, MD;  Location: [facility];  Service: General;  Laterality: N/A;  . INCISION AND DRAINAGE PERIRECTAL ABSCESS N/A 07/29/2019   Procedure: IRRIGATION AND DEBRIDEMENT PERIRECTAL ABSCESS;  Surgeon: [name], MD;  Location: [facility];  Service: General;  Laterality: N/A;  . lasix eye surgery Bilateral   . TONSILLECTOMY      Family History  Problem Relation Age of Onset  . Liver disease Mother        fatty liver- she is overweight  . Heart disease Paternal Aunt   . Colon cancer Neg Hx   . Colon polyps Neg Hx   . Diabetes Neg Hx   . Kidney disease Neg Hx   . Gallbladder disease Neg Hx   . Esophageal cancer Neg Hx     No Known Allergies  Current Outpatient Medications on File Prior to Visit  Medication Sig Dispense Refill  . Multiple Vitamin (DAILY VITAMIN PO) Take 1 packet by mouth daily. MEN'S SPORT VITAMIN PACK.  Omega 3, Grape Seed Extract, Milk Thistle Extract, Tribulus Terrestris, Vit D3.    . Omega-3 Fatty Acids (FISH OIL PO) Take 1 capsule by mouth daily.    . pantoprazole (PROTONIX) 40 MG tablet Take 1 tablet (40 mg total) by mouth daily. 90 tablet 1  . Probiotic Product (PROBIOTIC DAILY) CAPS Take by mouth daily.    . Red Yeast Rice Extract (RED YEAST RICE PO) Take 1 tablet by mouth daily.    Marland Kitchen acetaminophen (TYLENOL) 500 MG tablet You can take 1000 mg of Tylenol every 8 hours for pain.  You can buy this over-the-counter at any drugstore.  You can alternate this with plain ibuprofen.  Do not take more than 4000 mg of Tylenol per day can harm your liver. (Patient not taking: Reported on 08/19/2019) 30 tablet 0  . ibuprofen (ADVIL) 200 MG tablet You can take 2 to 3 tablets every 6 hours as needed for pain not  relieved by plain Tylenol.  You can alternate taking Tylenol then ibuprofen.  You can buy this over-the-counter at any drugstore without a prescription. (Patient not taking: Reported on 08/19/2019)    . milk thistle 175 MG tablet Take 175 mg by mouth daily.    Marland Kitchen oxyCODONE (OXY IR/ROXICODONE) 5 MG immediate release tablet Take 1 tablet (5 mg total) by mouth every 4 (four) hours as needed for breakthrough pain (pain not relieved by Tylenol/ibpurofen). (  Patient not taking: Reported on 08/19/2019) 20 tablet 0  . [DISCONTINUED] atorvastatin (LIPITOR) 20 MG tablet Take 1 tablet (20 mg total) by mouth daily. 30 tablet 3   No current facility-administered medications on file prior to visit.    Ht 6\' 2"  (1.88 m)   Wt 220 lb (99.8 kg) Comment: per pt  BMI 28.25 kg/m    Observations/Objective:   Gen: Awake, alert  Resp: Breathing sounds even and non-labored Psych: calm/pleasant demeanor Neuro: Alert and Oriented x 3 speech sounds clear.   Assessment and Plan:  Perirectal abscess- clinically improving. Has follow up scheduled with general surgeon tomorrow and is scheduled to meet with colorectal surgeon for consult on 08/31/18.  I encouraged him to keep these appointments.  Abnormal LFT- had mild elevation of LFT back in September. He would like to repeat these labs. I have placed a future order and he will schedule a lab appointment.   Migraines- Hx is most consistent with migraines.   Advised pt to d/c excedrin and begin PRN imitex.  He will send me a mychart message in a week or so to update me on how his headaches are doing. If symptoms fail to improve, will plan referral to neurology for further evaluation.   25 minutes spent on this non-face to face encounter.   Follow Up Instructions:    I discussed the assessment and treatment plan with the patient. The patient was provided an opportunity to ask questions and all were answered. The patient agreed with the plan and demonstrated an  understanding of the instructions.   The patient was advised to call back or seek an in-person evaluation if the symptoms worsen or if the condition fails to improve as anticipated.  October, NP

## 2019-08-20 NOTE — Discharge Summary (Signed)
Physician Discharge Summary  Patient ID: Gregory Fowler MRN: [id number] DOB/AGE: Jul 23, 1976 44 y.o.  Admit date: 07/28/2019 Discharge date: 07/29/2020  Admission Diagnoses:  Rectal pain Recurrent posterior perirectal abscess; left and right lateral internal/external hemorrhoids Tobacco use Hx nephrolithiasis Hx GERD/gastroparesis  Discharge Diagnoses:  Same   Active Problems:   Perirectal abscess   Peri-rectal abscess   PROCEDURES: Exam under anesthesia, incision and drainage and excisional debridement of recurrent posterior rectal abscess with scalpel 07/29/2019, Dr. Janalyn Rouse Course:  Gregory Fowler is a 44 y.o. (DOB: 10-03-1975)  white male whose primary care physician is [name], NP.             He is by himself.              He had a posterior perianal abscess drained by Dr. Harlow Asa on 04/18/2019.  He saw Puja once in our office for follow up on 05/05/2019.             He had no problems until about 2 days ago, when he started experiencing perirectal pain again.  He also developed constipation.             He's had no history of stomach, liver, or colon disease.  He has no history of inflammatory bowel disease.             He is pretty focused on a CT scan prior to any operative procedure. He was seen in the emergency department admitted by Dr. Alphonsa Overall. CT of the pelvis showed an irregular multi lobulated rim perirectal abscess involving the posterior levator plate extending from 4 o'clock position to the 8 o'clock position.  There is associated stranding and phlegmonous change in the ischio anal fat extending to the base of the gluteal cleft.  This was reviewed he was seen the following a.m. by Dr. [name].  Dr. Redmond Pulling recommended proceeding to the operating room for exam under anesthesia incision and drainage of the perirectal abscess with possible seton placement.  Patient agreed he underwent the procedure as described above.  He  tolerated the procedure well. He had a lot of problem the following a.m. getting his packing out let him do it on his own.  He was ready for discharge later in the day.  He was discharged home on sitz baths, local wound care, and antibiotics. Follow-up as listed below. Condition on discharge: Improved   Disposition: Discharge disposition: 01-Home or Self Care        Allergies as of 07/30/2019   No Known Allergies     Medication List    TAKE these medications   acetaminophen 500 MG tablet Commonly known as: TYLENOL You can take 1000 mg of Tylenol every 8 hours for pain.  You can buy this over-the-counter at any drugstore.  You can alternate this with plain ibuprofen.  Do not take more than 4000 mg of Tylenol per day can harm your liver.   DAILY VITAMIN PO Take 1 packet by mouth daily. MEN'S SPORT VITAMIN PACK.  Omega 3, Grape Seed Extract, Milk Thistle Extract, Tribulus Terrestris, Vit D3.   FISH OIL PO Take 1 capsule by mouth daily.   ibuprofen 200 MG tablet Commonly known as: ADVIL You can take 2 to 3 tablets every 6 hours as needed for pain not relieved by plain Tylenol.  You can alternate taking Tylenol then ibuprofen.  You can buy this over-the-counter at any drugstore without a prescription.   milk  thistle 175 MG tablet Take 175 mg by mouth daily.   oxyCODONE 5 MG immediate release tablet Commonly known as: Oxy IR/ROXICODONE Take 1 tablet (5 mg total) by mouth every 4 (four) hours as needed for breakthrough pain (pain not relieved by Tylenol/ibpurofen).   pantoprazole 40 MG tablet Commonly known as: PROTONIX Take 1 tablet (40 mg total) by mouth daily.   Probiotic Daily Caps Take by mouth daily.   RED YEAST RICE PO Take 1 tablet by mouth daily.     ASK your doctor about these medications   amoxicillin-clavulanate 875-125 MG tablet Commonly known as: AUGMENTIN Take 1 tablet by mouth every 12 (twelve) hours for 7 days. Ask about: Should I take this  medication?   methocarbamol 500 MG tablet Commonly known as: ROBAXIN Take 1 tablet (500 mg total) by mouth 3 (three) times daily for 5 days. Ask about: Should I take this medication?      Follow-up Information    [facility] Follow up on 08/20/2019.   Specialty: General Surgery Why: Your appointment is at 2 PM.  Be at the office 30 minutes early for check-in bring photo ID and insurance information. Contact information: [address] [place] [address] [phone]        [name], NP Follow up.   Specialty: Internal Medicine Why: Call and let them know you had surgery again. Contact information: [address] [place] [address] [phone]           Signed: Sherrie George 08/20/2019, 10:58 AM

## 2019-08-25 ENCOUNTER — Encounter: Payer: Self-pay | Admitting: Family

## 2019-08-25 ENCOUNTER — Telehealth: Payer: Self-pay | Admitting: *Deleted

## 2019-08-25 MED ORDER — AMITRIPTYLINE HCL 25 MG PO TABS: 25.0000 mg | ORAL_TABLET | Freq: Every day | ORAL | 2 refills | Status: DC

## 2019-08-25 NOTE — Telephone Encounter (Signed)
Copied from CRM [id number]. Topic: General - Other >> Aug 25, 2019 11:15 AM Gwenlyn Fudge wrote: Reason for CRM: Pt called stating he is still experiencing headaches and they are worsening, and that he would like to pursue further options. Pt stating that the medication he was prescribed has not been working and that it is giving him body aches after taking it. Please advise.

## 2019-08-26 ENCOUNTER — Ambulatory Visit (INDEPENDENT_AMBULATORY_CARE_PROVIDER_SITE_OTHER): Payer: Managed Care, Other (non HMO) | Admitting: Family

## 2019-08-26 ENCOUNTER — Encounter: Payer: Self-pay | Admitting: Family

## 2019-08-26 DIAGNOSIS — G43909 Migraine, unspecified, not intractable, without status migrainosus: Secondary | ICD-10-CM

## 2019-08-26 MED ORDER — ONDANSETRON 4 MG PO TBDP: 4.0000 mg | ORAL_TABLET | Freq: Three times a day (TID) | ORAL | 1 refills | Status: DC | PRN

## 2019-08-26 NOTE — Telephone Encounter (Signed)
Patient was seen today.

## 2019-08-26 NOTE — Telephone Encounter (Signed)
Please call pt to schedule an appointment with me today. OK to schedule virtual visit.

## 2019-08-26 NOTE — Telephone Encounter (Signed)
Will have virtual visit with Melissa today at 1 pm

## 2019-08-26 NOTE — Progress Notes (Signed)
Virtual Visit via Telephone Note  I connected with Gregory Fowler on 08/26/19 at  1:00 PM EST by telephone and verified that I am speaking with the correct person using two identifiers.  Location: Patient: home Provider: work   I discussed the limitations, risks, security and privacy concerns of performing an evaluation and management service by telephone and the availability of in person appointments. I also discussed with the patient that there may be a patient responsible charge related to this service. The patient expressed understanding and agreed to proceed.   History of Present Illness:  Pt reports that he has had HA daily x 8 weeks.  Usually starts 6:30 PM- last 2 hrs then feels fine.  2 days ago he woke up with a severe HA, "couldn't even open my eyes." As a result he had to miss work. Took imitrex, fell asleep, woke up without headache. Has had some associated nausea.  No vomiting. + photophobia/phonophobia.   Yesterday he developed severe HA, took imitrex, resolved 1 hour later.  Took first dose of elavil last night    Observations/Objective:  Gen: Awake, alert, no acute distress Resp: Breathing sounds even and non-labored Psych: calm/pleasant demeanor Neuro: Alert and Oriented x 3, speech is clear.   Assessment and Plan:  Migraines- uncontrolled. Some improvement with prn imitrex. Will see how he does on elavil for migraine prophylaxis. Add zofran as needed for nausea. Refer to neurology for further evaluation given the severity of his headaches.  Follow Up Instructions:    I discussed the assessment and treatment plan with the patient. The patient was provided an opportunity to ask questions and all were answered. The patient agreed with the plan and demonstrated an understanding of the instructions.   The patient was advised to call back or seek an in-person evaluation if the symptoms worsen or if the condition fails to improve as anticipated.  I provided 15  minutes of non-face-to-face time during this encounter.   Lemont Fillers, NP

## 2019-08-28 ENCOUNTER — Encounter: Payer: Self-pay | Admitting: Family

## 2019-09-13 ENCOUNTER — Encounter: Payer: Self-pay | Admitting: Family

## 2019-09-18 ENCOUNTER — Encounter: Payer: Self-pay | Admitting: Family

## 2019-09-18 ENCOUNTER — Other Ambulatory Visit: Payer: Self-pay

## 2019-09-18 ENCOUNTER — Ambulatory Visit (INDEPENDENT_AMBULATORY_CARE_PROVIDER_SITE_OTHER): Payer: Managed Care, Other (non HMO) | Admitting: Family

## 2019-09-18 VITALS — BP 121/83 | Temp 97.9°F | Resp 16 | Ht 72.0 in | Wt 228.0 lb

## 2019-09-18 DIAGNOSIS — G43909 Migraine, unspecified, not intractable, without status migrainosus: Secondary | ICD-10-CM | POA: Diagnosis not present

## 2019-09-18 DIAGNOSIS — N529 Male erectile dysfunction, unspecified: Secondary | ICD-10-CM

## 2019-09-18 DIAGNOSIS — K611 Rectal abscess: Secondary | ICD-10-CM | POA: Diagnosis not present

## 2019-09-18 MED ORDER — TADALAFIL 5 MG PO TABS: 5.0000 mg | ORAL_TABLET | Freq: Every day | ORAL | 5 refills | Status: DC | PRN

## 2019-09-18 NOTE — Progress Notes (Signed)
Subjective:    Patient ID: Gregory Fowler, male    DOB: 10/15/1975, 44 y.o.   MRN: [id number]  HPI  Patient is a 44 yr old male who presents today to discuss migraines.  Last visit he reported daily HA's x 8 weeks.  We placed him on Elavil. Reports that he took elavil nightly but continued to have daily headaches.  About 2 weeks ago he started claritin D and reports that his headaches resolved. Stopped Elavil and did not have recurrence of headaches.   Perirectal abscess- reports that he had follow up with his surgeon and was told that there is nothing that they can do to prevent recurrence.    He c/o of soft erections which is new for him and earlier ejaculation.    Review of Systems Past Medical History:  Diagnosis Date  . Abdominal pain, other specified site   . Backache, unspecified   . Fatty liver   . Gastroparesis   . GERD (gastroesophageal reflux disease)   . Hiatal hernia   . Kidney stone   . Nonspecific elevation of levels of transaminase or lactic acid dehydrogenase (LDH)      Social History   Socioeconomic History  . Marital status: Married    Spouse name: Not on file  . Number of children: 2  . Years of education: Not on file  . Highest education level: Not on file  Occupational History  . Occupation: Film/video editor: [employer]  Tobacco Use  . Smoking status: Current Every Day Smoker    Packs/day: 1.00    Years: 30.00    Pack years: 30.00    Types: Cigarettes    Start date: 04/13/2017  . Smokeless tobacco: Former Neurosurgeon    Types: Chew  Substance and Sexual Activity  . Alcohol use: Yes    Alcohol/week: 1.0 standard drinks    Types: 1 Cans of beer per week    Comment: occasional  . Drug use: No  . Sexual activity: Not on file  Other Topics Concern  . 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   Daughter-2013   Son- 2009   Married   Enjoys playing golf   Completed 12th grade   No DWI.    Social Determinants of Health   Financial Resource Strain:   . Difficulty of Paying Living Expenses: Not on file  Food Insecurity:   . Worried About Programme researcher, broadcasting/film/video in the Last Year: Not on file  . Ran Out of Food in the Last Year: Not on file  Transportation Needs:   . Lack of Transportation (Medical): Not on file  . Lack of Transportation (Non-Medical): Not on file  Physical Activity:   . Days of Exercise per Week: Not on file  . Minutes of Exercise per Session: Not on file  Stress:   . Feeling of Stress : Not on file  Social Connections:   . Frequency of Communication with Friends and Family: Not on file  . Frequency of Social Gatherings with Friends and Family: Not on file  . Attends Religious Services: Not on file  . Active Member of Clubs or Organizations: Not on file  . Attends Banker Meetings: Not on file  . Marital Status: Not on file  Intimate Partner Violence:   . Fear of Current or Ex-Partner: Not on file  . Emotionally Abused: Not  on file  . Physically Abused: Not on file  . Sexually Abused: Not on file    Past Surgical History:  Procedure Laterality Date  . INCISION AND DRAINAGE PERIRECTAL ABSCESS N/A 04/18/2019   Procedure: IRRIGATION AND DEBRIDEMENT PERIRECTAL ABSCESS EXAM UNDER ANESTHESIA;  Surgeon: Armandina Gemma, MD;  Location: [facility];  Service: General;  Laterality: N/A;  . INCISION AND DRAINAGE PERIRECTAL ABSCESS N/A 07/29/2019   Procedure: IRRIGATION AND DEBRIDEMENT PERIRECTAL ABSCESS;  Surgeon: [name], MD;  Location: [facility];  Service: General;  Laterality: N/A;  . lasix eye surgery Bilateral   . TONSILLECTOMY      Family History  Problem Relation Age of Onset  . Liver disease Mother        fatty liver- she is overweight  . Heart disease Paternal Aunt   . Colon cancer Neg Hx   . Colon polyps Neg Hx   . Diabetes Neg Hx   . Kidney disease Neg Hx   . Gallbladder disease Neg Hx   . Esophageal  cancer Neg Hx     No Known Allergies  Current Outpatient Medications on File Prior to Visit  Medication Sig Dispense Refill  . acetaminophen (TYLENOL) 500 MG tablet You can take 1000 mg of Tylenol every 8 hours for pain.  You can buy this over-the-counter at any drugstore.  You can alternate this with plain ibuprofen.  Do not take more than 4000 mg of Tylenol per day can harm your liver. 30 tablet 0  . ibuprofen (ADVIL) 200 MG tablet You can take 2 to 3 tablets every 6 hours as needed for pain not relieved by plain Tylenol.  You can alternate taking Tylenol then ibuprofen.  You can buy this over-the-counter at any drugstore without a prescription.    Marland Kitchen loratadine-pseudoephedrine (CLARITIN-D 24-HOUR) 10-240 MG 24 hr tablet Take 1 tablet by mouth daily.    . milk thistle 175 MG tablet Take 175 mg by mouth daily.    . Multiple Vitamin (DAILY VITAMIN PO) Take 1 packet by mouth daily. MEN'S SPORT VITAMIN PACK.  Omega 3, Grape Seed Extract, Milk Thistle Extract, Tribulus Terrestris, Vit D3.    . Omega-3 Fatty Acids (FISH OIL PO) Take 1 capsule by mouth daily.    . ondansetron (ZOFRAN ODT) 4 MG disintegrating tablet Take 1 tablet (4 mg total) by mouth every 8 (eight) hours as needed for nausea or vomiting. 20 tablet 1  . oxyCODONE (OXY IR/ROXICODONE) 5 MG immediate release tablet Take 1 tablet (5 mg total) by mouth every 4 (four) hours as needed for breakthrough pain (pain not relieved by Tylenol/ibpurofen). 20 tablet 0  . pantoprazole (PROTONIX) 40 MG tablet Take 1 tablet (40 mg total) by mouth daily. 90 tablet 1  . Probiotic Product (PROBIOTIC DAILY) CAPS Take by mouth daily.    . Red Yeast Rice Extract (RED YEAST RICE PO) Take 1 tablet by mouth daily.    Marland Kitchen amitriptyline (ELAVIL) 25 MG tablet Take 1 tablet (25 mg total) by mouth at bedtime. (Patient not taking: Reported on 09/18/2019) 30 tablet 2  . SUMAtriptan (IMITREX) 50 MG tablet Take 1 tab at start of headache. May repeat in 2 hrs if needed. (max 2  tabs/24 hrs) (Patient not taking: Reported on 09/18/2019) 10 tablet 5  . [DISCONTINUED] atorvastatin (LIPITOR) 20 MG tablet Take 1 tablet (20 mg total) by mouth daily. 30 tablet 3   No current facility-administered medications on file prior to visit.    BP 121/83 (BP Location:  Right Arm, Patient Position: Sitting, Cuff Size: Large)   Temp 97.9 F (36.6 C) (Temporal)   Resp 16   Ht 6' (1.829 m)   Wt 228 lb (103.4 kg)   SpO2 100%   BMI 30.92 kg/m       Objective:   Physical Exam Constitutional:      General: He is not in acute distress.    Appearance: He is well-developed.  HENT:     Head: Normocephalic and atraumatic.  Cardiovascular:     Rate and Rhythm: Normal rate and regular rhythm.     Heart sounds: No murmur.  Pulmonary:     Effort: Pulmonary effort is normal. No respiratory distress.     Breath sounds: Normal breath sounds. No wheezing or rales.  Skin:    General: Skin is warm and dry.  Neurological:     Mental Status: He is alert and oriented to person, place, and time.  Psychiatric:        Behavior: Behavior normal.        Thought Content: Thought content normal.           Assessment & Plan:  Perirectal abscess- currently stable.  Monitor.  ED- will give trial of cialis. If premature ejaculation continues to be an issue, could consider trial of SSRI.  Migraines- seems to be responding to claritin D. I asked him to try to switch to plain claritin to see if this is also helpful.  OK to remain off of elavil.   This visit occurred during the SARS-CoV-2 public health emergency.  Safety protocols were in place, including screening questions prior to the visit, additional usage of staff PPE, and extensive cleaning of exam room while observing appropriate contact time as indicated for disinfecting solutions.

## 2019-09-18 NOTE — Patient Instructions (Signed)
Start cialis as needed. Try switching from claritin- D to plain claritin.

## 2019-10-02 ENCOUNTER — Telehealth: Payer: Managed Care, Other (non HMO) | Admitting: Neurology

## 2019-10-26 ENCOUNTER — Other Ambulatory Visit: Payer: Self-pay | Admitting: Family

## 2019-12-21 ENCOUNTER — Ambulatory Visit: Payer: Managed Care, Other (non HMO) | Admitting: Family

## 2020-01-08 ENCOUNTER — Other Ambulatory Visit: Payer: Self-pay

## 2020-01-08 ENCOUNTER — Telehealth (INDEPENDENT_AMBULATORY_CARE_PROVIDER_SITE_OTHER): Payer: Managed Care, Other (non HMO) | Admitting: Family Medicine

## 2020-01-08 ENCOUNTER — Encounter: Payer: Self-pay | Admitting: Family Medicine

## 2020-01-08 DIAGNOSIS — J0100 Acute maxillary sinusitis, unspecified: Secondary | ICD-10-CM | POA: Diagnosis not present

## 2020-01-08 DIAGNOSIS — Z20822 Contact with and (suspected) exposure to covid-19: Secondary | ICD-10-CM

## 2020-01-08 MED ORDER — AMOXICILLIN 875 MG PO TABS: 875.0000 mg | ORAL_TABLET | Freq: Two times a day (BID) | ORAL | 0 refills | Status: DC

## 2020-01-08 NOTE — Progress Notes (Signed)
Virtual Visit via Video   I connected with patient on 01/08/20 at  2:30 PM EDT by a video enabled telemedicine application and verified that I am speaking with the correct person using two identifiers.  Location patient: Home Location provider: [facility], Office Persons participating in the virtual visit: Patient, Provider, CMA (Jess B)  I discussed the limitations of evaluation and management by telemedicine and the availability of in person appointments. The patient expressed understanding and agreed to proceed.  Subjective:   HPI:   URI- sxs started ~2 days ago w/ sore throat.  Worsened yesterday.  'feels like allergies or sinuses'.  + head congestion, nasal congestion.  + itchy/watery eyes.  Mild cough w/ some chest congestion.  + head pressure.  Took a dose of Claritin D yesterday w/o relief.  No fevers.  + body aches.  Some shortness of breath.  No known sick contacts.  Mild HA.  No N/V/D.  Some change in smell, no change in taste.  + tooth pain.  Has not had COVID nor been vaccinated.  Denies fatigue above baseline.  Denies hx of seasonal allergies but does have hx of previous sinus infxns.  ROS:   See pertinent positives and negatives per HPI.  Patient Active Problem List   Diagnosis Date Noted  . Peri-rectal abscess 07/28/2019  . Perirectal abscess 04/17/2019  . Preventative health care 09/08/2014  . Gastroparesis 10/11/2012  . GERD (gastroesophageal reflux disease) 05/01/2011  . Eructation 05/01/2011  . NAFLD (nonalcoholic fatty liver disease) 63/08/6008  . Abnormal LFTs 03/03/2011  . TOBACCO USER 07/20/2010    Social History   Tobacco Use  . Smoking status: Current Every Day Smoker    Packs/day: 1.00    Years: 30.00    Pack years: 30.00    Types: Cigarettes    Start date: 04/13/2017  . Smokeless tobacco: Former Neurosurgeon    Types: Chew  Substance Use Topics  . Alcohol use: Yes    Alcohol/week: 1.0 standard drinks    Types: 1 Cans of beer per week   Comment: occasional    Current Outpatient Medications:  .  milk thistle 175 MG tablet, Take 175 mg by mouth daily., Disp: , Rfl:  .  Multiple Vitamin (DAILY VITAMIN PO), Take 1 packet by mouth daily. MEN'S SPORT VITAMIN PACK.  Omega 3, Grape Seed Extract, Milk Thistle Extract, Tribulus Terrestris, Vit D3., Disp: , Rfl:  .  Omega-3 Fatty Acids (FISH OIL PO), Take 1 capsule by mouth daily., Disp: , Rfl:  .  pantoprazole (PROTONIX) 40 MG tablet, TAKE ONE TABLET BY MOUTH DAILY, Disp: 90 tablet, Rfl: 1 .  Probiotic Product (PROBIOTIC DAILY) CAPS, Take by mouth daily., Disp: , Rfl:  .  Red Yeast Rice Extract (RED YEAST RICE PO), Take 1 tablet by mouth daily., Disp: , Rfl:   No Known Allergies  Objective:   There were no vitals taken for this visit. AAOx3, NAD NCAT, EOMI No obvious CN deficits Coloring WNL Pt is able to speak clearly, coherently without shortness of breath or increased work of breathing.  Thought process is linear.  Mood is appropriate.   Assessment and Plan:   Maxillary sinusitis- new.  Pt's sxs could be allergy, viral, bacterial, or COVID.  Given that he has body aches, possible change in smell, some shortness of breath, I encouraged him to get COVID tested.  Will start Amox in case of bacterial sinusitis.  He is to continue daily antihistamine until feeling better.  Note  provided for time out of work.  Reviewed supportive care and red flags that should prompt return.  Pt expressed understanding and is in agreement w/ plan.      Annye Asa, MD 01/08/2020

## 2020-01-08 NOTE — Progress Notes (Signed)
I have discussed the procedure for the virtual visit with the patient who has given consent to proceed with assessment and treatment.   Pt unable to obtain vitals.   Jessica L Brodmerkel, CMA     

## 2020-01-09 ENCOUNTER — Encounter (INDEPENDENT_AMBULATORY_CARE_PROVIDER_SITE_OTHER): Payer: Self-pay

## 2020-01-10 ENCOUNTER — Encounter (INDEPENDENT_AMBULATORY_CARE_PROVIDER_SITE_OTHER): Payer: Self-pay

## 2020-01-12 ENCOUNTER — Encounter (INDEPENDENT_AMBULATORY_CARE_PROVIDER_SITE_OTHER): Payer: Self-pay

## 2020-01-12 ENCOUNTER — Encounter: Payer: Self-pay | Admitting: Family Medicine

## 2020-01-12 NOTE — Telephone Encounter (Signed)
I would recommend that he be evaluated in urgent care for a face to face visit today please.

## 2020-01-12 NOTE — Telephone Encounter (Signed)
Patient advised of provider's comments. He understands and will seek in person evaluation.

## 2020-04-11 ENCOUNTER — Ambulatory Visit (INDEPENDENT_AMBULATORY_CARE_PROVIDER_SITE_OTHER): Payer: Managed Care, Other (non HMO) | Admitting: Family

## 2020-04-11 DIAGNOSIS — Z5329 Procedure and treatment not carried out because of patient's decision for other reasons: Secondary | ICD-10-CM

## 2020-04-11 NOTE — Addendum Note (Signed)
Addended by: Kathryne Hitch on: 04/11/2020 10:57 AM   Modules accepted: Orders

## 2020-04-12 ENCOUNTER — Encounter: Payer: Managed Care, Other (non HMO) | Admitting: Family

## 2020-04-12 NOTE — Progress Notes (Signed)
Pt no showed.

## 2020-04-27 ENCOUNTER — Other Ambulatory Visit: Payer: Self-pay

## 2020-04-27 ENCOUNTER — Ambulatory Visit (INDEPENDENT_AMBULATORY_CARE_PROVIDER_SITE_OTHER): Payer: Managed Care, Other (non HMO) | Admitting: Family

## 2020-04-27 ENCOUNTER — Encounter: Payer: Self-pay | Admitting: Family

## 2020-04-27 VITALS — BP 121/82 | HR 92 | Temp 98.6°F | Resp 16 | Ht 74.0 in | Wt 220.0 lb

## 2020-04-27 DIAGNOSIS — R739 Hyperglycemia, unspecified: Secondary | ICD-10-CM

## 2020-04-27 DIAGNOSIS — D649 Anemia, unspecified: Secondary | ICD-10-CM

## 2020-04-27 DIAGNOSIS — E1169 Type 2 diabetes mellitus with other specified complication: Secondary | ICD-10-CM

## 2020-04-27 DIAGNOSIS — Z23 Encounter for immunization: Secondary | ICD-10-CM | POA: Diagnosis not present

## 2020-04-27 DIAGNOSIS — Z0000 Encounter for general adult medical examination without abnormal findings: Secondary | ICD-10-CM | POA: Diagnosis not present

## 2020-04-27 MED ORDER — BENZOYL PEROXIDE-ERYTHROMYCIN 5-3 % EX GEL: Freq: Two times a day (BID) | CUTANEOUS | 3 refills | Status: DC

## 2020-04-27 NOTE — Patient Instructions (Signed)
Please complete lab work prior to leaving. Please get vaccinated for COVID-19.

## 2020-04-27 NOTE — Progress Notes (Signed)
Subjective:    Patient ID: Gregory Fowler, male    DOB: [birth year], 44 y.o.   MRN: [id number]  HPI  Patient presents today for complete physical.  Immunizations: 2016 tetanus  Diet: working on weight loss.   Wt Readings from Last 3 Encounters:  04/27/20 220 lb (99.8 kg)  09/18/19 228 lb (103.4 kg)  08/19/19 220 lb (99.8 kg)   Exercise: goes to the [facility] Getting testosterone therapy at [facility]. Reports feeling better.   Vision: Reports up to date Dental:  Up to date  C/o acne on the back of his neck.  States that this began after he started testosterone therapy.  C/o thinning hair on the top of his head.  Review of Systems  Constitutional: Negative for unexpected weight change.  HENT: Negative for hearing loss and rhinorrhea.   Eyes: Negative for visual disturbance.  Respiratory: Negative for cough.   Cardiovascular: Negative for chest pain.  Gastrointestinal: Negative for blood in stool, constipation and diarrhea.  Genitourinary: Negative for difficulty urinating and frequency.  Musculoskeletal: Negative for arthralgias and myalgias.  Skin: Negative for rash.  Neurological: Negative for headaches.  Hematological: Negative for adenopathy.  Psychiatric/Behavioral:       Denies depression/anxiety   Past Medical History:  Diagnosis Date  . Abdominal pain, other specified site   . Backache, unspecified   . Fatty liver   . Gastroparesis   . GERD (gastroesophageal reflux disease)   . Hiatal hernia   . Kidney stone   . Nonspecific elevation of levels of transaminase or lactic acid dehydrogenase (LDH)      Social History   Socioeconomic History  . Marital status: Married    Spouse name: Not on file  . Number of children: 2  . Years of education: Not on file  . Highest education level: Not on file  Occupational History  . Occupation: Film/video editor: [employer]  Tobacco Use  . Smoking status: Current Every Day Smoker    Packs/day:  1.00    Years: 30.00    Pack years: 30.00    Types: Cigarettes    Start date: 04/13/2017  . Smokeless tobacco: Former Neurosurgeon    Types: Chew  Substance and Sexual Activity  . Alcohol use: Yes    Alcohol/week: 1.0 standard drink    Types: 1 Cans of beer per week    Comment: occasional  . Drug use: No  . Sexual activity: Not on file  Other Topics Concern  . 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   Daughter-2013   Son- 2009   Married   Enjoys playing golf   Completed 12th grade   No DWI.    Social Determinants of Health   Financial Resource Strain:   . Difficulty of Paying Living Expenses: Not on file  Food Insecurity:   . Worried About Programme researcher, broadcasting/film/video in the Last Year: Not on file  . Ran Out of Food in the Last Year: Not on file  Transportation Needs:   . Lack of Transportation (Medical): Not on file  . Lack of Transportation (Non-Medical): Not on file  Physical Activity:   . Days of Exercise per Week: Not on file  . Minutes of Exercise per Session: Not on file  Stress:   . Feeling of Stress : Not on file  Social Connections:   . Frequency of Communication  with Friends and Family: Not on file  . Frequency of Social Gatherings with Friends and Family: Not on file  . Attends Religious Services: Not on file  . Active Member of Clubs or Organizations: Not on file  . Attends Banker Meetings: Not on file  . Marital Status: Not on file  Intimate Partner Violence:   . Fear of Current or Ex-Partner: Not on file  . Emotionally Abused: Not on file  . Physically Abused: Not on file  . Sexually Abused: Not on file    Past Surgical History:  Procedure Laterality Date  . INCISION AND DRAINAGE PERIRECTAL ABSCESS N/A 04/18/2019   Procedure: IRRIGATION AND DEBRIDEMENT PERIRECTAL ABSCESS EXAM UNDER ANESTHESIA;  Surgeon: Darnell Level, MD;  Location: [facility];  Service: General;  Laterality: N/A;  . INCISION  AND DRAINAGE PERIRECTAL ABSCESS N/A 07/29/2019   Procedure: IRRIGATION AND DEBRIDEMENT PERIRECTAL ABSCESS;  Surgeon: Gaynelle Adu, MD;  Location: [facility];  Service: General;  Laterality: N/A;  . lasix eye surgery Bilateral   . TONSILLECTOMY      Family History  Problem Relation Age of Onset  . Liver disease Mother        fatty liver- she is overweight  . Heart disease Paternal Aunt   . Colon cancer Neg Hx   . Colon polyps Neg Hx   . Diabetes Neg Hx   . Kidney disease Neg Hx   . Gallbladder disease Neg Hx   . Esophageal cancer Neg Hx     No Known Allergies  Current Outpatient Medications on File Prior to Visit  Medication Sig Dispense Refill  . milk thistle 175 MG tablet Take 175 mg by mouth daily.    . Multiple Vitamin (DAILY VITAMIN PO) Take 1 packet by mouth daily. MEN'S SPORT VITAMIN PACK.  Omega 3, Grape Seed Extract, Milk Thistle Extract, Tribulus Terrestris, Vit D3.    . Omega-3 Fatty Acids (FISH OIL PO) Take 1 capsule by mouth daily.    . pantoprazole (PROTONIX) 40 MG tablet TAKE ONE TABLET BY MOUTH DAILY 90 tablet 1  . Probiotic Product (PROBIOTIC DAILY) CAPS Take by mouth daily.    . Red Yeast Rice Extract (RED YEAST RICE PO) Take 1 tablet by mouth daily.    . [DISCONTINUED] atorvastatin (LIPITOR) 20 MG tablet Take 1 tablet (20 mg total) by mouth daily. 30 tablet 3   No current facility-administered medications on file prior to visit.    There were no vitals taken for this visit.      Objective:   Physical Exam Physical Exam  Constitutional: He is oriented to person, place, and time. He appears well-developed and well-nourished. No distress.  HENT:  Head: Normocephalic and atraumatic.  Right Ear: Tympanic membrane and ear canal normal.  Left Ear: Tympanic membrane and ear canal normal.  Mouth/Throat: Oropharynx is clear and moist.  Eyes: Pupils are equal, round, and reactive to light. No scleral icterus.  Neck: Normal range of motion. No thyromegaly present.    Cardiovascular: Normal rate and regular rhythm.   No murmur heard. Pulmonary/Chest: Effort normal and breath sounds normal. No respiratory distress. He has no wheezes. He has no rales. He exhibits no tenderness.  Abdominal: Soft. Bowel sounds are normal. He exhibits no distension and no mass. There is no tenderness. There is no rebound and no guarding.  Musculoskeletal: He exhibits no edema.  Lymphadenopathy:    He has no cervical adenopathy.  Neurological: He is alert and oriented to person, place,  and time. He has normal patellar reflexes. He exhibits normal muscle tone. Coordination normal.  Skin: Skin is warm and dry. + acne noted on the back of his neck, small amount on front of neck. Mild hair thinning at the top of his head. Psychiatric: He has a normal mood and affect. His behavior is normal. Judgment and thought content normal.           Assessment & Plan:  Preventative care- Flu shot today.  Recommended that he also seek Covid vaccination.  Obtain labs as ordered.  Discussed healthy diet, exercise and weight loss.   Acne- we discussed that this is likely due to his testosterone therapy.  Recommended trial of topical benzamycin gel.  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 He is interested in preventing further hair loss. Recommended trial of otc Rogaine.   This visit occurred during the SARS-CoV-2 public health emergency.  Safety protocols were in place, including screening questions prior to the visit, additional usage of staff PPE, and extensive cleaning of exam room while observing appropriate contact time as indicated for disinfecting solutions.         Assessment & Plan:

## 2020-04-28 ENCOUNTER — Telehealth: Payer: Self-pay | Admitting: Family

## 2020-04-28 LAB — HEMOGLOBIN A1C
Hgb A1c MFr Bld: 5.2 % of total Hgb (ref <5.7)
Mean Plasma Glucose: 103 (calc)
eAG (mmol/L): 5.7 (calc)

## 2020-04-28 LAB — LIPID PANEL
Cholesterol: 204 mg/dL — ABNORMAL HIGH (ref <200)
HDL: 29 mg/dL — ABNORMAL LOW (ref >=40)
LDL Cholesterol (Calc): 139 mg/dL (calc) — ABNORMAL HIGH
Non-HDL Cholesterol (Calc): 175 mg/dL (calc) — ABNORMAL HIGH (ref <130)
Total CHOL/HDL Ratio: 7 (calc) — ABNORMAL HIGH (ref <5.0)
Triglycerides: 216 mg/dL — ABNORMAL HIGH (ref <150)

## 2020-04-28 LAB — CBC WITH DIFFERENTIAL/PLATELET
Absolute Monocytes: 821 cells/uL (ref 200–950)
Basophils Absolute: 84 cells/uL (ref 0–200)
Basophils Relative: 1.1 %
Eosinophils Absolute: 220 cells/uL (ref 15–500)
Eosinophils Relative: 2.9 %
HCT: 51.4 % — ABNORMAL HIGH (ref 38.5–50.0)
Hemoglobin: 17.4 g/dL — ABNORMAL HIGH (ref 13.2–17.1)
Lymphs Abs: 2265 cells/uL (ref 850–3900)
MCH: 28.1 pg (ref 27.0–33.0)
MCHC: 33.9 g/dL (ref 32.0–36.0)
MCV: 83 fL (ref 80.0–100.0)
MPV: 11.3 fL (ref 7.5–12.5)
Monocytes Relative: 10.8 %
Neutro Abs: 4210 cells/uL (ref 1500–7800)
Neutrophils Relative %: 55.4 %
Platelets: 203 10*3/uL (ref 140–400)
RBC: 6.19 10*6/uL — ABNORMAL HIGH (ref 4.20–5.80)
RDW: 13.6 % (ref 11.0–15.0)
Total Lymphocyte: 29.8 %
WBC: 7.6 10*3/uL (ref 3.8–10.8)

## 2020-04-28 LAB — COMPREHENSIVE METABOLIC PANEL
AG Ratio: 1.7 (calc) (ref 1.0–2.5)
ALT: 26 U/L (ref 9–46)
AST: 21 U/L (ref 10–40)
Albumin: 4.5 g/dL (ref 3.6–5.1)
Alkaline phosphatase (APISO): 56 U/L (ref 36–130)
BUN: 16 mg/dL (ref 7–25)
CO2: 30 mmol/L (ref 20–32)
Calcium: 9.4 mg/dL (ref 8.6–10.3)
Chloride: 103 mmol/L (ref 98–110)
Creat: 1.07 mg/dL (ref 0.60–1.35)
Globulin: 2.6 g/dL (calc) (ref 1.9–3.7)
Glucose, Bld: 87 mg/dL (ref 65–99)
Potassium: 4.3 mmol/L (ref 3.5–5.3)
Sodium: 137 mmol/L (ref 135–146)
Total Bilirubin: 0.6 mg/dL (ref 0.2–1.2)
Total Protein: 7.1 g/dL (ref 6.1–8.1)

## 2020-04-28 NOTE — Telephone Encounter (Signed)
See mychart.  

## 2020-04-30 NOTE — Telephone Encounter (Signed)
Gregory Fowler, please fax a copy of his cbc results to [facility]  [address] [place], [place] [address]

## 2020-05-02 ENCOUNTER — Encounter: Payer: Managed Care, Other (non HMO) | Admitting: Family

## 2020-05-02 NOTE — Telephone Encounter (Signed)
Lab results faxed to [facility] at [phone]

## 2020-05-19 ENCOUNTER — Other Ambulatory Visit: Payer: Self-pay | Admitting: Family

## 2020-06-08 ENCOUNTER — Telehealth (INDEPENDENT_AMBULATORY_CARE_PROVIDER_SITE_OTHER): Payer: Managed Care, Other (non HMO) | Admitting: Family Medicine

## 2020-06-08 ENCOUNTER — Other Ambulatory Visit: Payer: Self-pay

## 2020-06-08 ENCOUNTER — Encounter: Payer: Self-pay | Admitting: Family Medicine

## 2020-06-08 DIAGNOSIS — J0140 Acute pansinusitis, unspecified: Secondary | ICD-10-CM | POA: Diagnosis not present

## 2020-06-08 MED ORDER — AMOXICILLIN-POT CLAVULANATE 875-125 MG PO TABS: 1.0000 | ORAL_TABLET | Freq: Two times a day (BID) | ORAL | 0 refills | Status: DC

## 2020-06-08 MED ORDER — FLUTICASONE PROPIONATE 50 MCG/ACT NA SUSP: 2.0000 | Freq: Every day | NASAL | 6 refills | Status: DC

## 2020-06-08 NOTE — Progress Notes (Signed)
Virtual Visit via Video Note  I connected with Gregory Fowler on 06/08/20 at 10:20 AM EDT by a video enabled telemedicine application and verified that I am speaking with the correct person using two identifiers.  Location: Patient: work alone  Provider: home    I discussed the limitations of evaluation and management by telemedicine and the availability of in person appointments. The patient expressed understanding and agreed to proceed.  History of Present Illness: Pt is home c/o sore throat , sinus congestion / pressure and nasal congestion since Friday.  He took clartin / sudafed with little relief.   Some cough    His wife had the same symptoms and had several neg covid tests --- she was finally tx with abx by her dr.  No other complaints    Past Medical History:  Diagnosis Date  . Abdominal pain, other specified site   . Backache, unspecified   . Fatty liver   . Gastroparesis   . GERD (gastroesophageal reflux disease)   . Hiatal hernia   . Kidney stone   . Nonspecific elevation of levels of transaminase or lactic acid dehydrogenase (LDH)    Current Outpatient Medications on File Prior to Visit  Medication Sig Dispense Refill  . benzoyl peroxide-erythromycin (BENZAMYCIN) gel Apply topically 2 (two) times daily. 23.3 g 3  . milk thistle 175 MG tablet Take 175 mg by mouth daily.    . Multiple Vitamin (DAILY VITAMIN PO) Take 1 packet by mouth daily. MEN'S SPORT VITAMIN PACK.  Omega 3, Grape Seed Extract, Milk Thistle Extract, Tribulus Terrestris, Vit D3.    . Omega-3 Fatty Acids (FISH OIL PO) Take 1 capsule by mouth daily.    . pantoprazole (PROTONIX) 40 MG tablet Take 1 tablet (40 mg total) by mouth daily. 90 tablet 3  . Probiotic Product (PROBIOTIC DAILY) CAPS Take by mouth daily.    . Red Yeast Rice Extract (RED YEAST RICE PO) Take 1 tablet by mouth daily.    . [DISCONTINUED] atorvastatin (LIPITOR) 20 MG tablet Take 1 tablet (20 mg total) by mouth daily. 30 tablet 3   No  current facility-administered medications on file prior to visit.   No Known Allergies  Observations/Objective: There were no vitals filed for this visit. No fever  Pt is in nad  Assessment and Plan: 1. Acute non-recurrent pansinusitis abx and flonase  rto if no better  - amoxicillin-clavulanate (AUGMENTIN) 875-125 MG tablet; Take 1 tablet by mouth 2 (two) times daily.  Dispense: 20 tablet; Refill: 0 - fluticasone (FLONASE) 50 MCG/ACT nasal spray; Place 2 sprays into both nostrils daily.  Dispense: 16 g; Refill: 6   Follow Up Instructions:    I discussed the assessment and treatment plan with the patient. The patient was provided an opportunity to ask questions and all were answered. The patient agreed with the plan and demonstrated an understanding of the instructions.   The patient was advised to call back or seek an in-person evaluation if the symptoms worsen or if the condition fails to improve as anticipated.  I provided 25 minutes of non-face-to-face time during this encounter.   Donato Schultz, DO

## 2020-08-18 ENCOUNTER — Other Ambulatory Visit: Payer: Self-pay | Admitting: *Deleted

## 2020-08-18 DIAGNOSIS — D649 Anemia, unspecified: Secondary | ICD-10-CM

## 2020-09-28 ENCOUNTER — Telehealth: Payer: Self-pay | Admitting: *Deleted

## 2020-09-28 MED ORDER — ADAPALENE-BENZOYL PEROXIDE 0.1-2.5 % EX GEL: CUTANEOUS | 2 refills | Status: DC

## 2020-09-28 NOTE — Addendum Note (Signed)
Addended by: [name] on: 09/28/2020 01:39 PM   Modules accepted: Orders

## 2020-09-28 NOTE — Telephone Encounter (Signed)
[facility] sent a fax over stating that the benzamycin gel needs to be mixed with 70% alcohol.  They do not have the alcohol to mix it with.  The patient needs and alternative until it is.

## 2020-10-03 IMAGING — CT CT PELVIS W/ CM
2 of 3 series · 16 of 46 positions shown, 18 images · IV contrast (omnipaque)
Comparison: 04/02/2016 CT

CLINICAL DATA: Per ED notes: Presents with rectal pain That began
[REDACTED], he was seen by his primary care doctor yesterday and had
blood work and x rays and told everything was fine. Last night he
began running a fever and today the fever is 103.0. Hard to have a
bowel movement since pain began. Normal prostate exam yesterday. Flu
shot on [REDACTED].

EXAM:
CT PELVIS WITH CONTRAST
TECHNIQUE: Multidetector CT imaging of the pelvis was performed using the
standard protocol following the bolus administration of intravenous
contrast.
CONTRAST:  100mL OMNIPAQUE IOHEXOL 300 MG/ML  SOLN

[Series 3: axial soft tissue · axial · 0.93mm/px · z∈[-490,-218]mm · 13 of 158 slices shown, 15 images]
[im 11/158  soft-tissue]
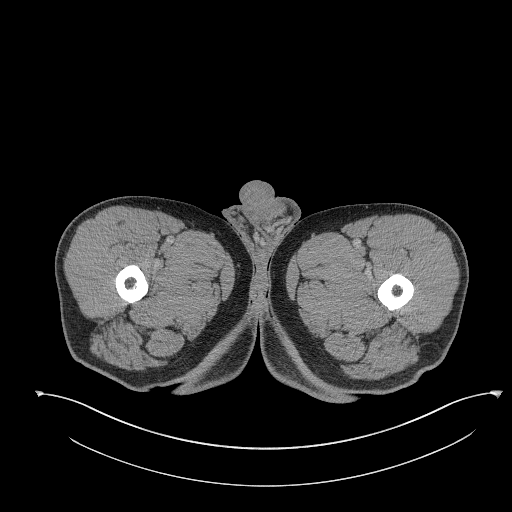
[im 11/158  bone]
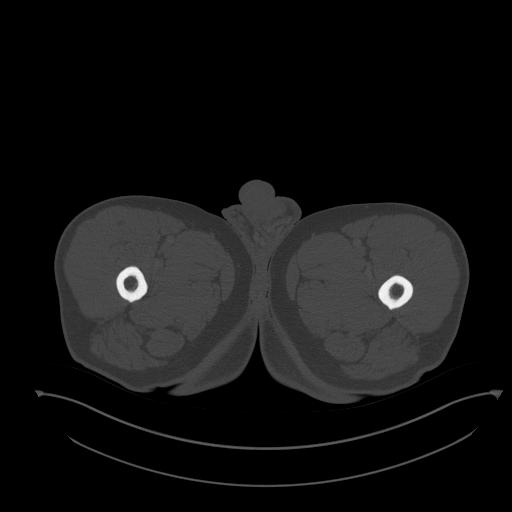
[im 21/158  soft-tissue]
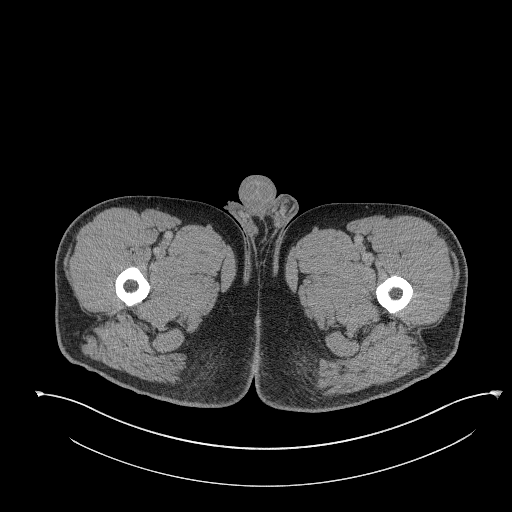
[im 31/158  soft-tissue]
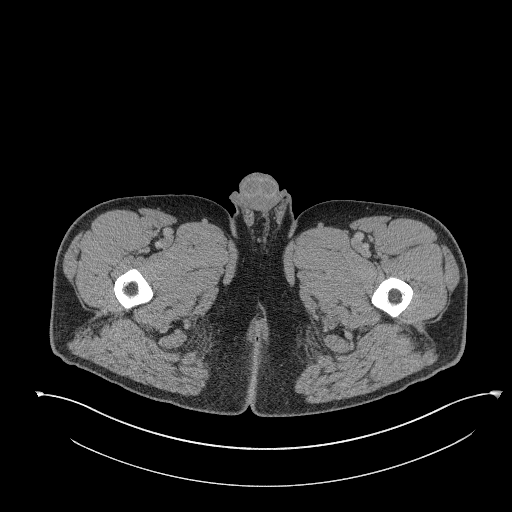
[im 46/158  soft-tissue]
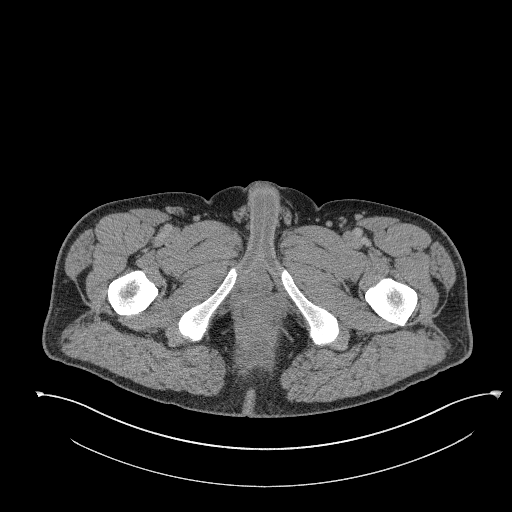
[im 56/158  soft-tissue]
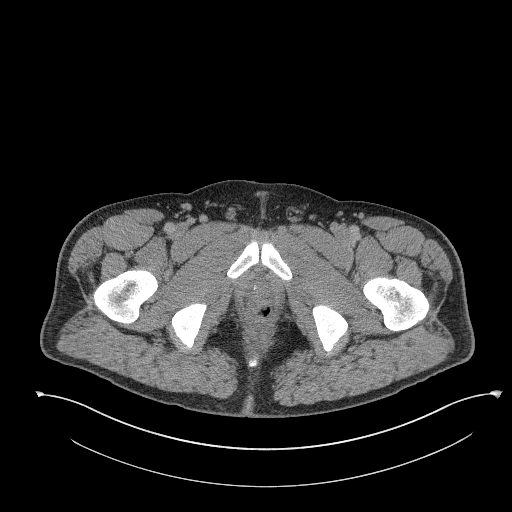
[im 66/158  soft-tissue]
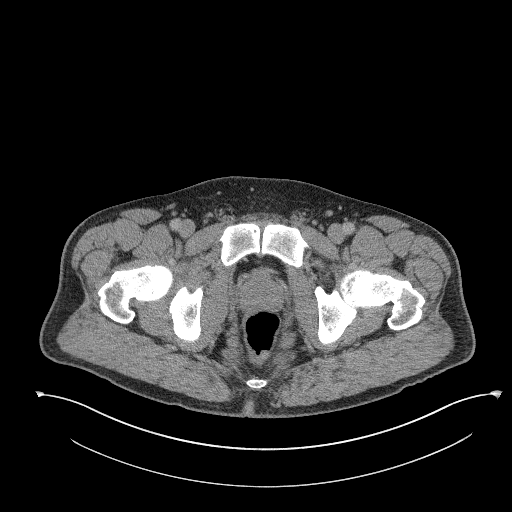
[im 82/158  soft-tissue]
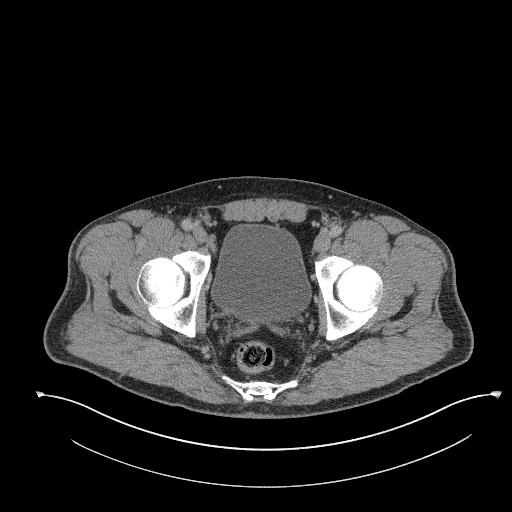
[im 92/158  soft-tissue]
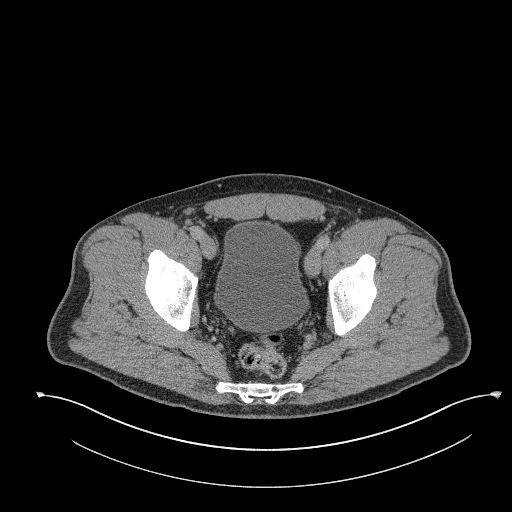
[im 102/158  soft-tissue]
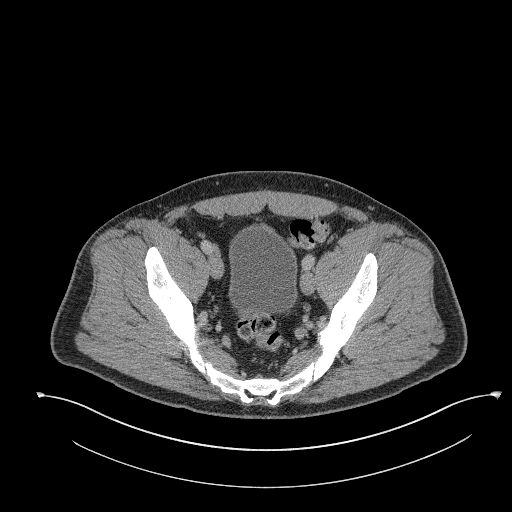
[im 102/158  bone]
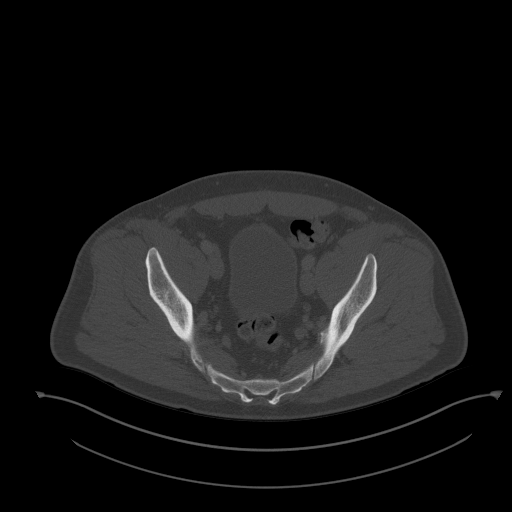
[im 112/158  soft-tissue]
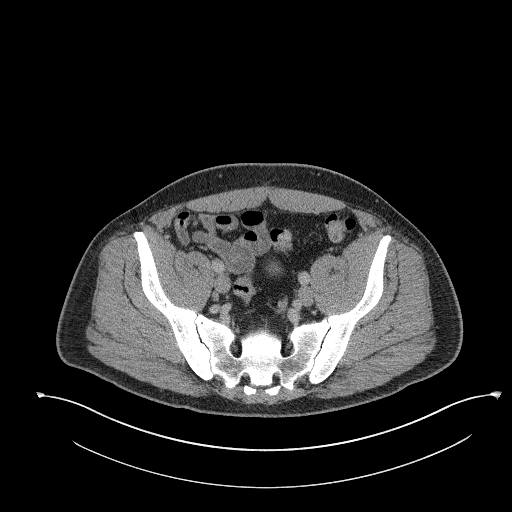
[im 127/158  soft-tissue]
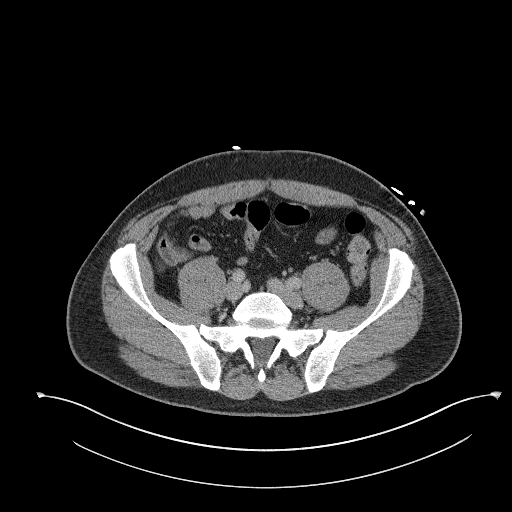
[im 137/158  soft-tissue]
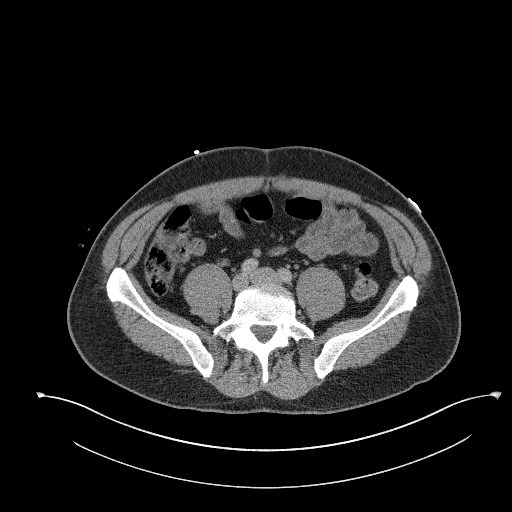
[im 147/158  soft-tissue]
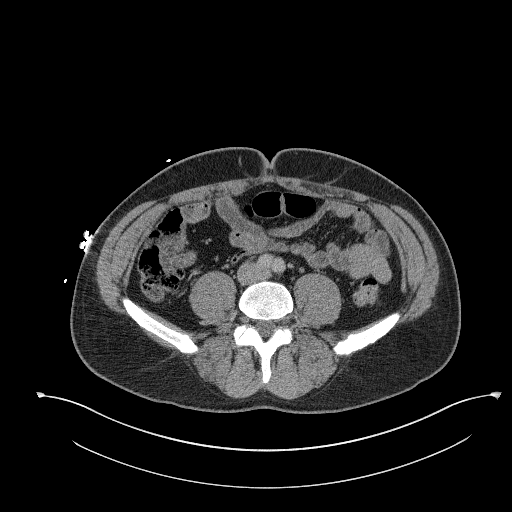

[Series 8: coronal st · coronal · 0.62mm/px · 3 of 136 slices shown]
[im 46/136  soft-tissue]
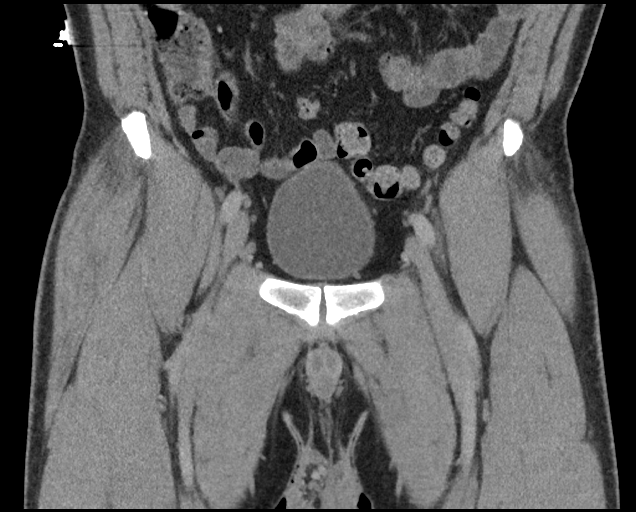
[im 61/136  soft-tissue]
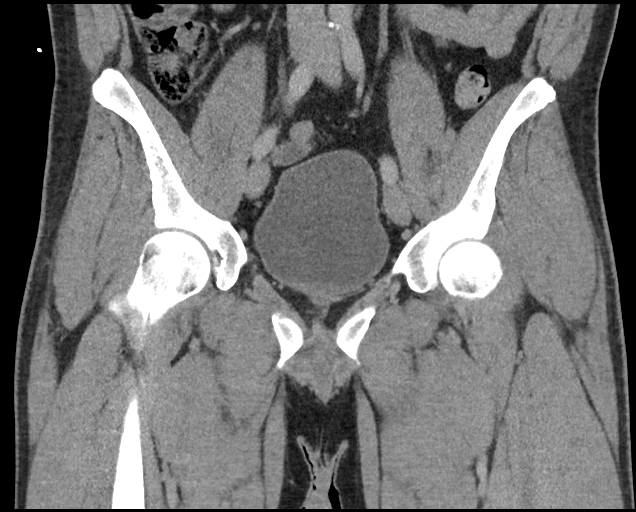
[im 76/136  soft-tissue]
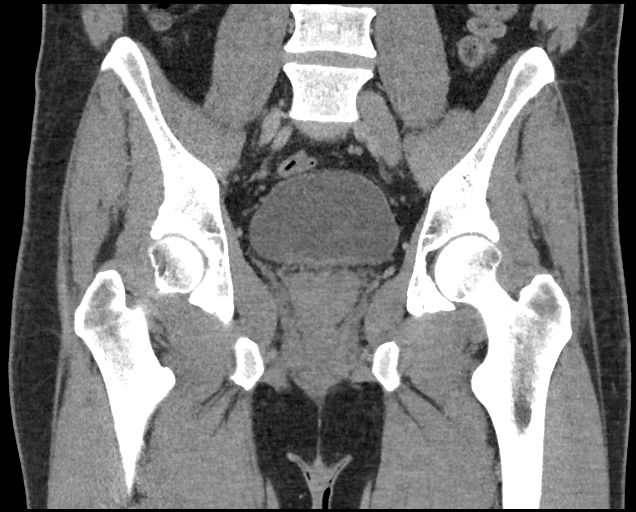

[16 of 46 positions shown; findings below may reference images not displayed]

FINDINGS: Urinary Tract:  Distal ureters and urinary bladder are unremarkable.

Bowel: A 2.4 x 2.3 x 3.4 centimeter low-attenuation lesion is
identified immediately posterior to the anus, associated with
adjacent subcutaneous fat stranding. Findings are consistent with
perirectal abscess.

Visualized loops of large and small bowel are unremarkable. The
appendix is well seen and has a normal appearance.

Vascular/Lymphatic: Small numerous inguinal lymph nodes are likely
reactive, not reaching grade tear for pathologic enlargement. No
retroperitoneal adenopathy.

Reproductive: Coarse calcifications are identified within the
prostate, otherwise normal in appearance.

Other:  No ascites. Anterior abdominal wall is unremarkable.

Musculoskeletal: No suspicious bone lesions identified.
IMPRESSION: 1. Posterior perirectal abscess measuring 2.4 x 2.3 x
centimeter.
2. Adjacent subcutaneous fat inflammatory changes.
3. No evidence for acute appendicitis.

## 2021-05-23 ENCOUNTER — Other Ambulatory Visit: Payer: Self-pay

## 2021-05-23 ENCOUNTER — Encounter: Payer: Self-pay | Admitting: Family

## 2021-05-23 ENCOUNTER — Ambulatory Visit (INDEPENDENT_AMBULATORY_CARE_PROVIDER_SITE_OTHER): Payer: Managed Care, Other (non HMO) | Admitting: Family

## 2021-05-23 ENCOUNTER — Telehealth: Payer: Self-pay | Admitting: Family

## 2021-05-23 VITALS — BP 120/74 | HR 85 | Temp 98.2°F | Resp 16 | Ht 74.0 in | Wt 236.0 lb

## 2021-05-23 DIAGNOSIS — Z0000 Encounter for general adult medical examination without abnormal findings: Secondary | ICD-10-CM

## 2021-05-23 DIAGNOSIS — E785 Hyperlipidemia, unspecified: Secondary | ICD-10-CM

## 2021-05-23 DIAGNOSIS — F17200 Nicotine dependence, unspecified, uncomplicated: Secondary | ICD-10-CM

## 2021-05-23 DIAGNOSIS — Z23 Encounter for immunization: Secondary | ICD-10-CM

## 2021-05-23 LAB — LIPID PANEL
Cholesterol: 205 mg/dL — ABNORMAL HIGH (ref 0–200)
HDL: 33.5 mg/dL — ABNORMAL LOW (ref >39.00)
NonHDL: 171.57
Total CHOL/HDL Ratio: 6
Triglycerides: 296 mg/dL — ABNORMAL HIGH (ref 0.0–149.0)
VLDL: 59.2 mg/dL — ABNORMAL HIGH (ref 0.0–40.0)

## 2021-05-23 LAB — COMPREHENSIVE METABOLIC PANEL
ALT: 32 U/L (ref 0–53)
AST: 25 U/L (ref 0–37)
Albumin: 4.6 g/dL (ref 3.5–5.2)
Alkaline Phosphatase: 55 U/L (ref 39–117)
BUN: 15 mg/dL (ref 6–23)
CO2: 30 mEq/L (ref 19–32)
Calcium: 9.6 mg/dL (ref 8.4–10.5)
Chloride: 100 mEq/L (ref 96–112)
Creatinine, Ser: 1.04 mg/dL (ref 0.40–1.50)
GFR: 86.83 mL/min (ref >60.00)
Glucose, Bld: 72 mg/dL (ref 70–99)
Potassium: 4.1 mEq/L (ref 3.5–5.1)
Sodium: 137 mEq/L (ref 135–145)
Total Bilirubin: 0.6 mg/dL (ref 0.2–1.2)
Total Protein: 7 g/dL (ref 6.0–8.3)

## 2021-05-23 LAB — LDL CHOLESTEROL, DIRECT: Direct LDL: 146 mg/dL

## 2021-05-23 NOTE — Assessment & Plan Note (Addendum)
Wt Readings from Last 3 Encounters:  05/23/21 236 lb (107 kg)  04/27/20 220 lb (99.8 kg)  09/18/19 228 lb (103.4 kg)   Continue to work on [employer], exercise and weight loss.  Counseled on importance of covid vaccination- pt declines. Refer for colonoscopy.

## 2021-05-23 NOTE — Progress Notes (Signed)
Subjective:   By signing my name below, I, Gregory Fowler, attest that this documentation has been prepared under the direction and in the presence of Gregory Alar, NP, 05/23/2021   Patient ID: Gregory Fowler, male    DOB: 01-Sep-1975, 45 y.o.   MRN: [id number]  Chief Complaint  Patient presents with   Annual Exam    HPI Patient is in today for a comprehensive physical exam.  He denies any fever, unexpected weight change, adenopathy, rash, hearing loss, ear pain, rhinorrhea, visual disturbances, eye pain, chest pain, leg swelling, cough, nausea, vomitting, diarrhea, blood in stool, dysuria, frequency, myalgias, arthralgias, headaches, depression or anxiety.  Lab concerns: He has a history of high cholesterol and fatty liver and would like to have those checked today.  Skin: he has seen improvement in his acne since beginning testosterone shots. He has a burn on his left forearm that he got while cooking on the stove. Low testosterone: He has a history of low testosterone and receives testosterone injections at [facility]. Immunizations: He is interested in getting his flu shot today in the office. He is UTD on tetanus. He is not interested in getting any Covid-19 vaccinations.  Diet: He reports that he does maintain a healthy diet.  Exercise: He notes regular exercise by going to the gym whenever he has free time. Colonoscopy: No previous record of procedure. Due.  Vision: He is UTD on vision care. Dental: He is UTD on dental care. Family: He has two children and is married. His son and daughter are very active in sports.  Work: He is currently working 2 jobs. He is not sleeping much due to the hours of these jobs.  Alcohol: He notes occasional alcohol use. Drugs: He does not use drugs. Tobacco: He currently uses tobacco products. Shx: He reports no surgeries in the past year. FMHx: He reports no changes to his family medical history.    Health Maintenance Due   Topic Date Due   COVID-19 Vaccine (1) Never done   URINE MICROALBUMIN  Never done   COLONOSCOPY (Pts 45-31yrs Insurance coverage will need to be confirmed)  Never done    Past Medical History:  Diagnosis Date   Abdominal pain, other specified site    Backache, unspecified    Fatty liver    Gastroparesis    GERD (gastroesophageal reflux disease)    Hiatal hernia    Kidney stone    Nonspecific elevation of levels of transaminase or lactic acid dehydrogenase (LDH)     Past Surgical History:  Procedure Laterality Date   INCISION AND DRAINAGE PERIRECTAL ABSCESS N/A 04/18/2019   Procedure: IRRIGATION AND DEBRIDEMENT PERIRECTAL ABSCESS EXAM UNDER ANESTHESIA;  Surgeon: Armandina Gemma, Fowler;  Location: [facility];  Service: General;  Laterality: N/A;   INCISION AND DRAINAGE PERIRECTAL ABSCESS N/A 07/29/2019   Procedure: IRRIGATION AND DEBRIDEMENT PERIRECTAL ABSCESS;  Surgeon: [name], Fowler;  Location: [facility];  Service: General;  Laterality: N/A;   lasix eye surgery Bilateral    TONSILLECTOMY      Family History  Problem Relation Age of Onset   Liver disease Mother        fatty liver- she is overweight   Heart disease Paternal Aunt    Colon cancer Neg Hx    Colon polyps Neg Hx    Diabetes Neg Hx    Kidney disease Neg Hx    Gallbladder disease Neg Hx    Esophageal cancer Neg Hx  Social History   Socioeconomic History   Marital status: Married    Spouse name: Not on file   Number of children: 2   Years of education: Not on file   Highest education level: Not on file  Occupational History   Occupation: Chartered loss adjuster    Employer: [employer]  Tobacco Use   Smoking status: Every Day    Packs/day: 1.00    Years: 30.00    Pack years: 30.00    Types: Cigarettes    Start date: 04/13/2017   Smokeless tobacco: Former    Types: Chew  Substance and Sexual Activity   Alcohol use: Yes    Alcohol/week: 1.0 standard drink    Types: 1 Cans of beer per week    Comment:  occasional   Drug use: No   Sexual activity: Yes    Partners: Female  Other Topics Concern   Not on file  Social History Narrative   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   Daughter-2013   Son- 2009   Married   Enjoys playing golf   Completed 12th grade   No DWI.    Social Determinants of Health   Financial Resource Strain: Not on file  Food Insecurity: Not on file  Transportation Needs: Not on file  Physical Activity: Not on file  Stress: Not on file  Social Connections: Not on file  Intimate Partner Violence: Not on file    Outpatient Medications Prior to Visit  Medication Sig Dispense Refill   Adapalene-Benzoyl Peroxide 0.1-2.5 % gel Apply topically at bedtime. 45 g 2   anastrozole (ARIMIDEX) 1 MG tablet Take by mouth.     benzoyl peroxide-erythromycin (BENZAMYCIN) gel Apply topically 2 (two) times daily. 23.3 g 3   doxycycline (VIBRA-TABS) 100 MG tablet Take 100 mg by mouth 2 (two) times daily.     milk thistle 175 MG tablet Take 175 mg by mouth daily.     Multiple Vitamin (DAILY VITAMIN PO) Take 1 packet by mouth daily. MEN'S SPORT VITAMIN PACK.  Omega 3, Grape Seed Extract, Milk Thistle Extract, Tribulus Terrestris, Vit D3.     Omega-3 Fatty Acids (FISH OIL PO) Take 1 capsule by mouth daily.     pantoprazole (PROTONIX) 40 MG tablet Take 1 tablet (40 mg total) by mouth daily. 90 tablet 3   Probiotic Product (PROBIOTIC DAILY) CAPS Take by mouth daily.     Red Yeast Rice Extract (RED YEAST RICE PO) Take 1 tablet by mouth daily.     amoxicillin-clavulanate (AUGMENTIN) 875-125 MG tablet Take 1 tablet by mouth 2 (two) times daily. 20 tablet 0   fluticasone (FLONASE) 50 MCG/ACT nasal spray Place 2 sprays into both nostrils daily. 16 g 6   No facility-administered medications prior to visit.    No Known Allergies  Review of Systems  Constitutional:  Negative for fever.       (-) unexpected weight changes (-) adenopathy  HENT:  Negative for ear  pain and hearing loss.        (-) rhinorrhea  Eyes:  Negative for pain.       (-) visual disturbances   Respiratory:  Negative for cough.   Cardiovascular:  Negative for chest pain and leg swelling.  Gastrointestinal:  Negative for blood in stool, diarrhea, nausea and vomiting.  Genitourinary:  Negative for dysuria and frequency.  Musculoskeletal:  Negative for joint pain and myalgias.  Skin:  Negative for rash.  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  Psychiatric/Behavioral:  Negative for depression. The patient is not nervous/anxious.       Objective:    Physical Exam Constitutional:      General: He is not in acute distress.    Appearance: Normal appearance. He is not ill-appearing.  HENT:     Head: Normocephalic and atraumatic.     Right Ear: Tympanic membrane, ear canal and external ear normal.     Left Ear: Tympanic membrane, ear canal and external ear normal.  Eyes:     Extraocular Movements: Extraocular movements intact.     Pupils: Pupils are equal, round, and reactive to light.     Comments: (-) nystagmus   Cardiovascular:     Rate and Rhythm: Normal rate and regular rhythm.     Heart sounds: Normal heart sounds. No murmur heard.   No gallop.  Pulmonary:     Effort: Pulmonary effort is normal. No respiratory distress.     Breath sounds: Normal breath sounds. No wheezing or rales.  Abdominal:     Palpations: Abdomen is soft.     Tenderness: There is no abdominal tenderness.  Musculoskeletal:     Comments: (+) 5/5 upper and lower extremity strength  Lymphadenopathy:     Cervical: No cervical adenopathy.  Skin:    General: Skin is warm and dry.     Findings: Burn (small superficial on right forearm) present.  Neurological:     Mental Status: He is alert and oriented to person, place, and time.  Psychiatric:        Behavior: Behavior normal.        Judgment: Judgment normal.    BP 120/74 (BP  Location: Right Arm, Patient Position: Sitting, Cuff Size: Large)   Pulse 85   Temp 98.2 F (36.8 C) (Oral)   Resp 16   Ht $R'6\' 2"'yv$  (1.88 m)   Wt 236 lb (107 kg)   SpO2 99%   BMI 30.30 kg/m  Wt Readings from Last 3 Encounters:  05/23/21 236 lb (107 kg)  04/27/20 220 lb (99.8 kg)  09/18/19 228 lb (103.4 kg)       Assessment & Plan:   Problem List Items Addressed This Visit       Unprioritized   TOBACCO USER    Counseled on the importance of tobacco cessation.       Preventative health care    Wt Readings from Last 3 Encounters:  05/23/21 236 lb (107 kg)  04/27/20 220 lb (99.8 kg)  09/18/19 228 lb (103.4 kg)  Continue to work on [employer], exercise and weight loss.  Counseled on importance of covid vaccination- pt declines. Refer for colonoscopy.       Relevant Orders   Ambulatory referral to Gastroenterology   Other Visit Diagnoses     Needs flu shot    -  Primary   Relevant Orders   Flu Vaccine QUAD 6+ mos PF IM (Fluarix Quad PF) (Completed)   Hyperlipidemia, unspecified hyperlipidemia type       Relevant Orders   Comp Met (CMET)   Lipid panel      No orders of the defined types were placed in this encounter.   I, Gregory Alar, NP, personally preformed the services described in this documentation.  All medical record entries made by the scribe were at my direction and in my presence.  I have reviewed the chart and discharge instructions (if  applicable) and agree that the record reflects my personal performance and is accurate and complete. 05/23/2021  I,Gregory Fowler,acting as a Education administrator for Nance Pear, NP.,have documented all relevant documentation on the behalf of Nance Pear, NP,as directed by  Nance Pear, NP while in the presence of Nance Pear, NP.  Nance Pear, NP

## 2021-05-23 NOTE — Patient Instructions (Signed)
Please complete lab work prior to leaving.   

## 2021-05-23 NOTE — Assessment & Plan Note (Signed)
Counseled on the importance of tobacco cessation.

## 2021-05-23 NOTE — Telephone Encounter (Signed)
See mychart.  

## 2021-06-12 ENCOUNTER — Other Ambulatory Visit: Payer: Self-pay | Admitting: Family

## 2021-11-21 ENCOUNTER — Encounter: Payer: Self-pay | Admitting: Internal Medicine

## 2021-12-11 ENCOUNTER — Other Ambulatory Visit: Payer: Self-pay | Admitting: Family

## 2022-01-07 ENCOUNTER — Other Ambulatory Visit: Payer: Self-pay | Admitting: Family

## 2022-01-12 ENCOUNTER — Ambulatory Visit (AMBULATORY_SURGERY_CENTER): Payer: Managed Care, Other (non HMO) | Admitting: *Deleted

## 2022-01-12 VITALS — Ht 74.0 in | Wt 220.0 lb

## 2022-01-12 DIAGNOSIS — Z1211 Encounter for screening for malignant neoplasm of colon: Secondary | ICD-10-CM

## 2022-01-12 MED ORDER — NA SULFATE-K SULFATE-MG SULF 17.5-3.13-1.6 GM/177ML PO SOLN: 1.0000 | ORAL | 0 refills | Status: DC

## 2022-01-12 NOTE — Progress Notes (Signed)
Patient's pre-visit was done today over the phone with the patient. Name,DOB and address verified. Patient denies any allergies to Eggs and Soy. Patient denies any problems with anesthesia/sedation. Patient is not taking any diet pills or blood thinners. No home Oxygen. Insurance confirmed with patient. Pt will use Good rx.  Prep instructions sent to pt's MyChart &  mailed to pt-pt is aware. Patient understands to call us back with any questions or concerns. Patient is aware of our care-partner policy.   EMMI education assigned to the patient for the procedure, sent to MyChart.

## 2022-01-22 ENCOUNTER — Telehealth: Payer: Self-pay | Admitting: Family

## 2022-01-22 ENCOUNTER — Telehealth: Payer: Self-pay | Admitting: *Deleted

## 2022-01-22 NOTE — Telephone Encounter (Signed)
Pt scheduled for tomorrow

## 2022-01-22 NOTE — Telephone Encounter (Signed)
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 Translation No Nurse Assessment Nurse: Lane Hacker, RN, Elvin So Date/Time (Eastern Time): 01/22/2022 9:59:55 AM Confirm and document reason for call. If symptomatic, describe symptoms. ---Caller states that last night, he got hot while working and then he started having sweats, dizziness, blurry vision with nosebleed. At 2 AM, an hr after nose bleed, BP was 169/98, and irregular and 80 bpm. By then he was feeling better than earlier. Now he just woke up from resting, and no s/s. He wanted to f/u with MD. (He was at dental visit one month ago with HTN, and had to have dental care).  List chronic conditions. ---nosebleeds usually when he feels hot  01/22/2022 10:08:50 AM See PCP within 24 Hours Yes Lane Hacker, Charity fundraiser, [place]

## 2022-01-22 NOTE — Telephone Encounter (Signed)
Westley Hummer (spouse) called stating that pt had high BP and a bloody nose earlier today. Pt's last two BP readings were as follows:  169/99 @ 2a 6.12.23 200/90-something few weeks prior  Pt was with Charlene on call and was transferred top triage nurse for further eval.

## 2022-01-23 ENCOUNTER — Ambulatory Visit (INDEPENDENT_AMBULATORY_CARE_PROVIDER_SITE_OTHER): Payer: Managed Care, Other (non HMO) | Admitting: Family

## 2022-01-23 VITALS — BP 130/82 | HR 86 | Temp 98.3°F | Resp 16 | Wt 233.0 lb

## 2022-01-23 DIAGNOSIS — I10 Essential (primary) hypertension: Secondary | ICD-10-CM

## 2022-01-23 DIAGNOSIS — R030 Elevated blood-pressure reading, without diagnosis of hypertension: Secondary | ICD-10-CM

## 2022-01-23 HISTORY — DX: Essential (primary) hypertension: I10

## 2022-01-23 LAB — BASIC METABOLIC PANEL
BUN: 15 mg/dL (ref 6–23)
CO2: 25 mEq/L (ref 19–32)
Calcium: 10.1 mg/dL (ref 8.4–10.5)
Chloride: 103 mEq/L (ref 96–112)
Creatinine, Ser: 1.03 mg/dL (ref 0.40–1.50)
GFR: 87.43 mL/min (ref >60.00)
Glucose, Bld: 89 mg/dL (ref 70–99)
Potassium: 4.1 mEq/L (ref 3.5–5.1)
Sodium: 136 mEq/L (ref 135–145)

## 2022-01-23 MED ORDER — AMLODIPINE BESYLATE 2.5 MG PO TABS: 2.5000 mg | ORAL_TABLET | Freq: Every day | ORAL | 0 refills | Status: DC

## 2022-01-23 NOTE — Progress Notes (Signed)
Subjective:   By signing my name below, I, [name], attest that this documentation has been prepared under the direction and in the presence of [name] NP, 01/23/2022     Patient ID: Gregory Fowler, male    DOB: [birth year], 46 y.o.   MRN: [id number]  Chief Complaint  Patient presents with   Hypertension    Patient reports having elevated BP readings in the last 2 months.    Epistaxis    Complains of having  nose bleed    HPI Patient is in today for an office visit.  Blood Pressure - He complains of high blood pressure. When he was at the dentist a month ago to get his tooth pulled, his blood pressure was measured and his systolic was 683. He went back to the dentist two days later and systolic measured to be 419-622. He also notes that he was at work and begun to burn up, his nose also started bleeding for 30 minutes while at work. He went to go cool down and his blood pressure was measured to be 168/98. He also checked his blood pressure the other day and was measured to be about 148/98. He notes that he does not get a lot of sleep at times. On Monday and Tuesday he works back to back. BP Readings from Last 3 Encounters:  01/23/22 130/82  05/23/21 120/74  04/27/20 121/82   Pulse Readings from Last 3 Encounters:  01/23/22 86  05/23/21 85  04/27/20 92   Work Note - He is requesting a work note for his job. He is requesting a note for 01/22/2022 and 01/23/2022.  Cholesterol - He is inquiring about his cholesterol levels. He states that last time it was measured, it was a bit elevated.  Lab Results  Component Value Date   CHOL 205 (H) 05/23/2021   HDL 33.50 (L) 05/23/2021   LDLCALC 139 (H) 04/27/2020   LDLDIRECT 146.0 05/23/2021   TRIG 296.0 (H) 05/23/2021   CHOLHDL 6 05/23/2021     Health Maintenance Due  Topic Date Due   COVID-19 Vaccine (1) Never done   URINE MICROALBUMIN  Never done   COLONOSCOPY (Pts 45-29yrs Insurance coverage will need to be  confirmed)  Never done    Past Medical History:  Diagnosis Date   Abdominal pain, other specified site    Backache, unspecified    Fatty liver    Gastroparesis    GERD (gastroesophageal reflux disease)    Hiatal hernia    Kidney stone    Nonspecific elevation of levels of transaminase or lactic acid dehydrogenase (LDH)     Past Surgical History:  Procedure Laterality Date   INCISION AND DRAINAGE PERIRECTAL ABSCESS N/A 04/18/2019   Procedure: IRRIGATION AND DEBRIDEMENT PERIRECTAL ABSCESS EXAM UNDER ANESTHESIA;  Surgeon: Armandina Gemma, MD;  Location: [facility];  Service: General;  Laterality: N/A;   INCISION AND DRAINAGE PERIRECTAL ABSCESS N/A 07/29/2019   Procedure: IRRIGATION AND DEBRIDEMENT PERIRECTAL ABSCESS;  Surgeon: [name], MD;  Location: [facility];  Service: General;  Laterality: N/A;   lasix eye surgery Bilateral    TONSILLECTOMY     UPPER GASTROINTESTINAL ENDOSCOPY  2012   Dr.Patterson    Family History  Problem Relation Age of Onset   Liver disease Mother        fatty liver- she is overweight   Heart disease Paternal Aunt    Colon cancer Neg Hx    Colon polyps Neg Hx    Diabetes  Neg Hx    Kidney disease Neg Hx    Gallbladder disease Neg Hx    Esophageal cancer Neg Hx     Social History   Socioeconomic History   Marital status: Married    Spouse name: Not on file   Number of children: 2   Years of education: Not on file   Highest education level: Not on file  Occupational History   Occupation: Comptroller: [employer]  Tobacco Use   Smoking status: Every Day    Packs/day: 1.00    Years: 30.00    Total pack years: 30.00    Types: Cigarettes    Start date: 04/13/2017   Smokeless tobacco: Former    Types: Nurse, children's Use: Never used  Substance and Sexual Activity   Alcohol use: Yes    Comment: occasional   Drug use: No   Sexual activity: Yes    Partners: Female  Other Topics Concern   Not on file  Social  History Narrative   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   Daughter-2013   Son- 2009   Married   Enjoys playing golf   Completed 12th grade   No DWI.    Social Determinants of Health   Financial Resource Strain: Not on file  Food Insecurity: Not on file  Transportation Needs: Not on file  Physical Activity: Not on file  Stress: Not on file  Social Connections: Not on file  Intimate Partner Violence: Not on file    Outpatient Medications Prior to Visit  Medication Sig Dispense Refill   anastrozole (ARIMIDEX) 1 MG tablet Take by mouth.     doxycycline (VIBRA-TABS) 100 MG tablet Take 100 mg by mouth 2 (two) times daily.     milk thistle 175 MG tablet Take 175 mg by mouth daily.     Multiple Vitamin (DAILY VITAMIN PO) Take 1 packet by mouth daily. MEN'S SPORT VITAMIN PACK.  Omega 3, Grape Seed Extract, Milk Thistle Extract, Tribulus Terrestris, Vit D3.     Na Sulfate-K Sulfate-Mg Sulf 17.5-3.13-1.6 GM/177ML SOLN Take 1 kit by mouth as directed. May use generic SUPREP;NO prior authorizations will be done.Please use Singlecare or GOOD-RX coupon. 354 mL 0   Omega-3 Fatty Acids (FISH OIL PO) Take 1 capsule by mouth daily.     pantoprazole (PROTONIX) 40 MG tablet TAKE ONE TABLET BY MOUTH DAILY 90 tablet 1   Probiotic Product (PROBIOTIC DAILY) CAPS Take by mouth daily.     Red Yeast Rice Extract (RED YEAST RICE PO) Take 1 tablet by mouth daily.     testosterone cypionate (DEPOTESTOSTERONE CYPIONATE) 200 MG/ML injection 1.2 cc     benzoyl peroxide-erythromycin (BENZAMYCIN) gel Apply topically 2 (two) times daily. 23.3 g 3   No facility-administered medications prior to visit.    No Known Allergies  ROS See HPI    Objective:    Physical Exam Constitutional:      General: He is not in acute distress.    Appearance: Normal appearance. He is not ill-appearing.  HENT:     Head: Normocephalic and atraumatic.     Right Ear: External ear normal.     Left Ear:  External ear normal.  Eyes:     Extraocular Movements: Extraocular movements intact.     Pupils: Pupils are equal, round, and reactive to light.  Cardiovascular:     Rate and Rhythm: Normal rate  and regular rhythm.     Heart sounds: Normal heart sounds. No murmur heard.    No gallop.  Pulmonary:     Effort: Pulmonary effort is normal. No respiratory distress.     Breath sounds: Normal breath sounds. No wheezing or rales.  Skin:    General: Skin is warm and dry.  Neurological:     Mental Status: He is alert and oriented to person, place, and time.  Psychiatric:        Mood and Affect: Mood normal.        Behavior: Behavior normal.        Judgment: Judgment normal.     BP 130/82 (BP Location: Right Arm, Patient Position: Sitting, Cuff Size: Large)   Pulse 86   Temp 98.3 F (36.8 C) (Oral)   Resp 16   Wt 233 lb (105.7 kg)   SpO2 98%   BMI 29.92 kg/m  Wt Readings from Last 3 Encounters:  01/23/22 233 lb (105.7 kg)  01/12/22 220 lb (99.8 kg)  05/23/21 236 lb (107 kg)       Assessment & Plan:   Problem List Items Addressed This Visit       Unprioritized   Elevated blood pressure reading - Primary    New. BP looks good here today but he has had multiple high readings with multiple machines recently. I would like to protect him against the high BP spikes.  Pt is advised as follows: Please add amlodipine 2.5 mg once daily. Check blood pressure once daily for 1 week and send me your readings via mychart. Discussed low sodium diet.       Relevant Orders   Basic metabolic panel    Meds ordered this encounter  Medications   amLODipine (NORVASC) 2.5 MG tablet    Sig: Take 1 tablet (2.5 mg total) by mouth daily.    Dispense:  90 tablet    Refill:  0    Order Specific Question:   Supervising Provider    Answer:   Penni Homans A [[id number]]    I, Nance Pear, NP, personally preformed the services described in this documentation.  All medical record entries made  by the scribe were at my direction and in my presence.  I have reviewed the chart and discharge instructions (if applicable) and agree that the record reflects my personal performance and is accurate and complete. 01/23/2022   I,Amber Collins,acting as a Education administrator for Nance Pear, NP.,have documented all relevant documentation on the behalf of Nance Pear, NP,as directed by  Nance Pear, NP while in the presence of Nance Pear, NP.    Nance Pear, NP

## 2022-01-23 NOTE — Assessment & Plan Note (Signed)
New. BP looks good here today but he has had multiple high readings with multiple machines recently. I would like to protect him against the high BP spikes.  Pt is advised as follows: Please add amlodipine 2.5 mg once daily. Check blood pressure once daily for 1 week and send me your readings via mychart. Discussed low sodium diet.

## 2022-01-23 NOTE — Patient Instructions (Signed)
Please add amlodipine 2.5 mg once daily. Check blood pressure once daily for 1 week and send me your readings prior to work.

## 2022-01-24 ENCOUNTER — Encounter (INDEPENDENT_AMBULATORY_CARE_PROVIDER_SITE_OTHER): Payer: Managed Care, Other (non HMO) | Admitting: Family

## 2022-01-24 DIAGNOSIS — Z8249 Family history of ischemic heart disease and other diseases of the circulatory system: Secondary | ICD-10-CM

## 2022-01-24 DIAGNOSIS — Z720 Tobacco use: Secondary | ICD-10-CM

## 2022-01-26 ENCOUNTER — Encounter: Payer: Self-pay | Admitting: Family

## 2022-01-26 DIAGNOSIS — Z8249 Family history of ischemic heart disease and other diseases of the circulatory system: Secondary | ICD-10-CM | POA: Insufficient documentation

## 2022-01-26 HISTORY — DX: Family history of ischemic heart disease and other diseases of the circulatory system: Z82.49

## 2022-01-26 MED ORDER — ATORVASTATIN CALCIUM 20 MG PO TABS: 20.0000 mg | ORAL_TABLET | Freq: Every day | ORAL | 1 refills | Status: DC

## 2022-01-26 NOTE — Addendum Note (Signed)
Addended by: [name] on: 01/26/2022 07:41 AM   Modules accepted: Orders

## 2022-01-26 NOTE — Telephone Encounter (Signed)
Please see the MyChart message reply(ies) for my assessment and plan.  The patient gave consent for this Medical Advice Message and is aware that it may result in a bill to their insurance company as well as the possibility that this may result in a co-payment or deductible. They are an established patient, but are not seeking medical advice exclusively about a problem treated during an in person or video visit in the last 7 days. I did not recommend an in person or video visit within 7 days of my reply.  I spent a total of 10 minutes cumulative time within 7 days through MyChart messaging Harry Shuck S O'Sullivan, NP  

## 2022-01-30 ENCOUNTER — Encounter: Payer: Self-pay | Admitting: Internal Medicine

## 2022-02-02 ENCOUNTER — Ambulatory Visit (AMBULATORY_SURGERY_CENTER): Payer: Managed Care, Other (non HMO) | Admitting: Internal Medicine

## 2022-02-02 ENCOUNTER — Encounter: Payer: Self-pay | Admitting: Internal Medicine

## 2022-02-02 VITALS — BP 116/72 | HR 77 | Temp 97.7°F | Resp 16 | Ht 74.0 in | Wt 220.0 lb

## 2022-02-02 DIAGNOSIS — Z1211 Encounter for screening for malignant neoplasm of colon: Secondary | ICD-10-CM | POA: Diagnosis not present

## 2022-02-02 MED ORDER — SODIUM CHLORIDE 0.9 % IV SOLN: 500.0000 mL | Freq: Once | INTRAVENOUS | Status: DC

## 2022-02-02 NOTE — Progress Notes (Signed)
I have reviewed the patient's medical history in detail and updated the computerized patient record.   VS BY DT. 

## 2022-02-02 NOTE — Progress Notes (Signed)
HISTORY OF PRESENT ILLNESS:  Gregory Fowler is a 46 y.o. male who presents today for screening colonoscopy.  No active GI complaints.  No contraindications  REVIEW OF SYSTEMS:  All non-GI ROS negative.  Past Medical History:  Diagnosis Date   Abdominal pain, other specified site    Backache, unspecified    Fatty liver    Gastroparesis    GERD (gastroesophageal reflux disease)    Hiatal hernia    Kidney stone    Nonspecific elevation of levels of transaminase or lactic acid dehydrogenase (LDH)     Past Surgical History:  Procedure Laterality Date   INCISION AND DRAINAGE PERIRECTAL ABSCESS N/A 04/18/2019   Procedure: IRRIGATION AND DEBRIDEMENT PERIRECTAL ABSCESS EXAM UNDER ANESTHESIA;  Surgeon: Darnell Level, MD;  Location: [facility];  Service: General;  Laterality: N/A;   INCISION AND DRAINAGE PERIRECTAL ABSCESS N/A 07/29/2019   Procedure: IRRIGATION AND DEBRIDEMENT PERIRECTAL ABSCESS;  Surgeon: Gaynelle Adu, MD;  Location: [facility];  Service: General;  Laterality: N/A;   lasix eye surgery Bilateral    TONSILLECTOMY     UPPER GASTROINTESTINAL ENDOSCOPY  2012   Dr.Patterson    Social History Davaughn Mallak  reports that he has been smoking cigarettes. He started smoking about 4 years ago. He has a 30.00 pack-year smoking history. He has quit using smokeless tobacco.  His smokeless tobacco use included chew. He reports current alcohol use. He reports that he does not use drugs.  family history includes CAD in his father, paternal grandfather, paternal great-grandfather, paternal uncle, and paternal uncle; Liver disease in his mother.  No Known Allergies     PHYSICAL EXAMINATION: Vital signs: BP 112/64   Pulse 81   Temp 97.7 F (36.5 C) (Temporal)   Ht 6\' 2"  (1.88 m)   Wt 220 lb (99.8 kg)   SpO2 97%   BMI 28.25 kg/m  General: Well-developed, well-nourished, no acute distress HEENT: Sclerae are anicteric, conjunctiva pink. Oral mucosa intact Lungs: Clear Heart:  Regular Abdomen: soft, nontender, nondistended, no obvious ascites, no peritoneal signs, normal bowel sounds. No organomegaly. Extremities: No edema Psychiatric: alert and oriented x3. Cooperative      ASSESSMENT:  Colon cancer screening   PLAN:  Screening colonoscopy

## 2022-02-05 ENCOUNTER — Telehealth: Payer: Self-pay | Admitting: *Deleted

## 2022-02-23 ENCOUNTER — Ambulatory Visit (INDEPENDENT_AMBULATORY_CARE_PROVIDER_SITE_OTHER): Payer: Managed Care, Other (non HMO) | Admitting: Family

## 2022-02-23 VITALS — BP 111/70 | HR 91 | Temp 98.3°F | Resp 16 | Wt 230.0 lb

## 2022-02-23 DIAGNOSIS — E663 Overweight: Secondary | ICD-10-CM

## 2022-02-23 DIAGNOSIS — Z6829 Body mass index (BMI) 29.0-29.9, adult: Secondary | ICD-10-CM

## 2022-02-23 DIAGNOSIS — Z720 Tobacco use: Secondary | ICD-10-CM | POA: Diagnosis not present

## 2022-02-23 DIAGNOSIS — I10 Essential (primary) hypertension: Secondary | ICD-10-CM

## 2022-02-23 DIAGNOSIS — E785 Hyperlipidemia, unspecified: Secondary | ICD-10-CM

## 2022-02-23 DIAGNOSIS — E291 Testicular hypofunction: Secondary | ICD-10-CM | POA: Insufficient documentation

## 2022-02-23 DIAGNOSIS — G8929 Other chronic pain: Secondary | ICD-10-CM | POA: Insufficient documentation

## 2022-02-23 DIAGNOSIS — Z8249 Family history of ischemic heart disease and other diseases of the circulatory system: Secondary | ICD-10-CM

## 2022-02-23 DIAGNOSIS — R519 Headache, unspecified: Secondary | ICD-10-CM

## 2022-02-23 HISTORY — DX: Overweight: E66.3

## 2022-02-23 HISTORY — DX: Other chronic pain: G89.29

## 2022-02-23 HISTORY — DX: Hyperlipidemia, unspecified: E78.5

## 2022-02-23 HISTORY — DX: Body mass index (BMI) 29.0-29.9, adult: Z68.29

## 2022-02-23 HISTORY — DX: Testicular hypofunction: E29.1

## 2022-02-23 NOTE — Assessment & Plan Note (Signed)
Pt is hesitant to start statin. Reviewed his calculated 10 CV risk which is 20%.  He also has a family hx of heart disease. I strongly recommended that he start a statin.

## 2022-02-23 NOTE — Progress Notes (Signed)
Subjective:   By signing my name below, I, Gregory Fowler, attest that this documentation has been prepared under the direction and in the presence of Gregory Downs' Suvillivan, NP 02/23/2022   Patient ID: Gregory Fowler, male    DOB: June 12, 1976, 46 y.o.   MRN: [id number]  Chief Complaint  Patient presents with   Hypertension    Here for follow up    HPI Patient is in today for an office visit.   Headaches - He complains of daily headaches that arisen about a month ago. He states that the pain started as a dull feeling on the back of his head, which has now changed to a sharp pain in the front of his head. He states that he does not sleep much, but thinks that he might snore. He reports that these symptoms arise every couple of years and happen for about a month or two. He notes that these symptoms arise during the summertime. He does work in the heat but states that he drinks a lot of fluid.   Blood Pressure - His blood pressure is normal. He was prescribed a low dose of Amlodipine and has since been taking 2.5 Mg of Amlodipine. He reports that he takes the medication daily. During the first period of taking the medication he reports a systolic range of 144 - 166 and a diastolic range of 97 - 115. No high readings since right after he began the amlodipine. He denies of any dizziness while taking Amlodipine. BP Readings from Last 3 Encounters:  02/23/22 111/70  02/02/22 116/72  01/23/22 130/82   Pulse Readings from Last 3 Encounters:  02/23/22 91  02/02/22 77  01/23/22 86   Insulin - He reports that he got his insulin levels checked, and states that levels are elevated.  Cholesterol - He expresses concerns about taking statins. He is scheduled to see a cardiologist, Dr.Munley, on 03/22/2022. He states that he has a family history of heart disease.   Lab Results  Component Value Date   CHOL 205 (H) 05/23/2021   HDL 33.50 (L) 05/23/2021   LDLCALC 139 (H) 04/27/2020   LDLDIRECT  146.0 05/23/2021   TRIG 296.0 (H) 05/23/2021   CHOLHDL 6 05/23/2021    Health Maintenance Due  Topic Date Due   COVID-19 Vaccine (1) Never done   URINE MICROALBUMIN  Never done    Past Medical History:  Diagnosis Date   Abdominal pain, other specified site    Backache, unspecified    Fatty liver    Gastroparesis    GERD (gastroesophageal reflux disease)    Hiatal hernia    Kidney stone    Nonspecific elevation of levels of transaminase or lactic acid dehydrogenase (LDH)     Past Surgical History:  Procedure Laterality Date   INCISION AND DRAINAGE PERIRECTAL ABSCESS N/A 04/18/2019   Procedure: IRRIGATION AND DEBRIDEMENT PERIRECTAL ABSCESS EXAM UNDER ANESTHESIA;  Surgeon: Darnell Level, MD;  Location: [facility];  Service: General;  Laterality: N/A;   INCISION AND DRAINAGE PERIRECTAL ABSCESS N/A 07/29/2019   Procedure: IRRIGATION AND DEBRIDEMENT PERIRECTAL ABSCESS;  Surgeon: Gaynelle Adu, MD;  Location: [facility];  Service: General;  Laterality: N/A;   lasix eye surgery Bilateral    TONSILLECTOMY     UPPER GASTROINTESTINAL ENDOSCOPY  2012   Dr.Patterson    Family History  Problem Relation Age of Onset   Liver disease Mother        fatty liver- she is overweight   CAD Father  CAD Paternal Grandfather        bypass   CAD Paternal Uncle        died from MI   CAD Paternal Uncle        died from MI   CAD Paternal Great-grandfather    Colon cancer Neg Hx    Colon polyps Neg Hx    Diabetes Neg Hx    Kidney disease Neg Hx    Gallbladder disease Neg Hx    Esophageal cancer Neg Hx     Social History   Socioeconomic History   Marital status: Married    Spouse name: Not on file   Number of children: 2   Years of education: Not on file   Highest education level: Not on file  Occupational History   Occupation: Film/video editor: [employer]  Tobacco Use   Smoking status: Every Day    Packs/day: 1.00    Years: 30.00    Total pack years: 30.00     Types: Cigarettes    Start date: 04/13/2017   Smokeless tobacco: Former    Types: Associate Professor Use: Never used  Substance and Sexual Activity   Alcohol use: Yes    Comment: occasional   Drug use: No   Sexual activity: Yes    Partners: Female  Other Topics Concern   Not on file  Social History Narrative   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   Daughter-2013   Son- 2009   Married   Enjoys playing golf   Completed 12th grade   No DWI.    Social Determinants of Health   Financial Resource Strain: Not on file  Food Insecurity: Not on file  Transportation Needs: Not on file  Physical Activity: Not on file  Stress: Not on file  Social Connections: Not on file  Intimate Partner Violence: Not on file    Outpatient Medications Prior to Visit  Medication Sig Dispense Refill   amLODipine (NORVASC) 2.5 MG tablet Take 1 tablet (2.5 mg total) by mouth daily. 90 tablet 0   anastrozole (ARIMIDEX) 1 MG tablet Take by mouth.     atorvastatin (LIPITOR) 20 MG tablet Take 1 tablet (20 mg total) by mouth daily. 90 tablet 1   doxycycline (VIBRA-TABS) 100 MG tablet Take 100 mg by mouth 2 (two) times daily.     milk thistle 175 MG tablet Take 175 mg by mouth daily.     Multiple Vitamin (DAILY VITAMIN PO) Take 1 packet by mouth daily. MEN'S SPORT VITAMIN PACK.  Omega 3, Grape Seed Extract, Milk Thistle Extract, Tribulus Terrestris, Vit D3.     Omega-3 Fatty Acids (FISH OIL PO) Take 1 capsule by mouth daily.     pantoprazole (PROTONIX) 40 MG tablet TAKE ONE TABLET BY MOUTH DAILY 90 tablet 1   Probiotic Product (PROBIOTIC DAILY) CAPS Take by mouth daily.     Red Yeast Rice Extract (RED YEAST RICE PO) Take 1 tablet by mouth daily.     testosterone cypionate (DEPOTESTOSTERONE CYPIONATE) 200 MG/ML injection 1.2 cc     No facility-administered medications prior to visit.    No Known Allergies  Review of Systems  Neurological:  Positive for headaches.        Objective:    Physical Exam Constitutional:      General: He is not in acute distress.    Appearance: Normal appearance. He is not ill-appearing.  HENT:     Head: Normocephalic and atraumatic.     Right Ear: External ear normal.     Left Ear: External ear normal.  Eyes:     Extraocular Movements: Extraocular movements intact.     Pupils: Pupils are equal, round, and reactive to light.  Cardiovascular:     Rate and Rhythm: Normal rate and regular rhythm.     Heart sounds: Normal heart sounds. No murmur heard.    No gallop.  Pulmonary:     Effort: Pulmonary effort is normal. No respiratory distress.     Breath sounds: Normal breath sounds. No wheezing or rales.  Skin:    General: Skin is warm and dry.  Neurological:     Mental Status: He is alert and oriented to person, place, and time.  Psychiatric:        Mood and Affect: Mood normal.        Behavior: Behavior normal.        Judgment: Judgment normal.     BP 111/70 (BP Location: Right Arm, Patient Position: Sitting, Cuff Size: Large)   Pulse 91   Temp 98.3 F (36.8 C) (Oral)   Resp 16   Wt 230 lb (104.3 kg)   SpO2 98%   BMI 29.53 kg/m  Wt Readings from Last 3 Encounters:  02/23/22 230 lb (104.3 kg)  02/02/22 220 lb (99.8 kg)  01/23/22 233 lb (105.7 kg)       Assessment & Plan:   Problem List Items Addressed This Visit       Unprioritized   Tobacco abuse    Encouraged smoking cessation.       Overweight with body mass index (BMI) of 29 to 29.9 in adult - Primary    He reports elevated insulin level at [facility].  We discussed importance of exercise and weight loss.       Hypogonadism in male    He continues to follow at [facility] for testosterone therapy. Reports that they have been doing phlebotomy due to elevated RBC.       Hyperlipidemia    Pt is hesitant to start statin. Reviewed his calculated 10 CV risk which is 20%.  He also has a family hx of heart disease. I strongly recommended that he  start a statin.       HTN (hypertension)    BP Readings from Last 3 Encounters:  02/23/22 111/70  02/02/22 116/72  01/23/22 130/82  BP is stable/improved. Continue amlodipine 2.5mg  once daily.       Family history of early CAD    He has a consultation scheduled with cardiology.       Chronic nonintractable headache    He seems to relate these to the summer months.  He works in a hot environment. Encouraged him to really push his fluids. If headaches do not improve he will let me know and we will plan referral to neurology. I did ask him about snoring but he is not sure that this is a major issue for him. Thinks he might snore. Could consider referral for sleep study if headaches persist.        No orders of the defined types were placed in this encounter.   I, Nance Pear, NP, personally preformed the services described in this documentation.  All medical record entries made by the scribe were at my direction and in my presence.  I have reviewed the chart and discharge instructions (if applicable) and agree that  the record reflects my personal performance and is accurate and complete. 02/23/2022   I,Amber Collins,acting as a scribe for Nance Pear, NP.,have documented all relevant documentation on the behalf of Nance Pear, NP,as directed by  Nance Pear, NP while in the presence of Nance Pear, NP.   Nance Pear, NP

## 2022-02-23 NOTE — Assessment & Plan Note (Addendum)
BP Readings from Last 3 Encounters:  02/23/22 111/70  02/02/22 116/72  01/23/22 130/82   BP is stable/improved. Continue amlodipine 2.5mg  once daily.

## 2022-02-23 NOTE — Assessment & Plan Note (Signed)
He continues to follow at [facility] for testosterone therapy. Reports that they have been doing phlebotomy due to elevated RBC.

## 2022-02-23 NOTE — Assessment & Plan Note (Signed)
He reports elevated insulin level at [facility].  We discussed importance of exercise and weight loss.

## 2022-02-23 NOTE — Assessment & Plan Note (Addendum)
He seems to relate these to the summer months.  He works in a hot environment. Encouraged him to really push his fluids. If headaches do not improve he will let me know and we will plan referral to neurology. I did ask him about snoring but he is not sure that this is a major issue for him. Thinks he might snore. Could consider referral for sleep study if headaches persist.

## 2022-02-23 NOTE — Assessment & Plan Note (Signed)
Encouraged smoking cessation 

## 2022-02-23 NOTE — Assessment & Plan Note (Signed)
He has a consultation scheduled with cardiology.

## 2022-02-24 ENCOUNTER — Encounter (HOSPITAL_COMMUNITY): Payer: Self-pay | Admitting: *Deleted

## 2022-02-24 ENCOUNTER — Other Ambulatory Visit: Payer: Self-pay

## 2022-02-24 ENCOUNTER — Emergency Department (HOSPITAL_COMMUNITY): Payer: Managed Care, Other (non HMO)

## 2022-02-24 ENCOUNTER — Inpatient Hospital Stay (HOSPITAL_COMMUNITY)
Admission: EM | Admit: 2022-02-24 | Discharge: 2022-02-26 | DRG: 346 | Disposition: A | Discharge status: Home / Self Care | Payer: Managed Care, Other (non HMO) | Attending: Surgery | Admitting: Surgery

## 2022-02-24 DIAGNOSIS — F17210 Nicotine dependence, cigarettes, uncomplicated: Secondary | ICD-10-CM | POA: Diagnosis present

## 2022-02-24 DIAGNOSIS — K611 Rectal abscess: Principal | ICD-10-CM | POA: Diagnosis present

## 2022-02-24 DIAGNOSIS — Z87442 Personal history of urinary calculi: Secondary | ICD-10-CM

## 2022-02-24 DIAGNOSIS — K3184 Gastroparesis: Secondary | ICD-10-CM | POA: Diagnosis present

## 2022-02-24 DIAGNOSIS — K219 Gastro-esophageal reflux disease without esophagitis: Secondary | ICD-10-CM | POA: Diagnosis present

## 2022-02-24 DIAGNOSIS — K5900 Constipation, unspecified: Secondary | ICD-10-CM | POA: Diagnosis present

## 2022-02-24 DIAGNOSIS — I700 Atherosclerosis of aorta: Secondary | ICD-10-CM | POA: Diagnosis present

## 2022-02-24 DIAGNOSIS — K644 Residual hemorrhoidal skin tags: Secondary | ICD-10-CM | POA: Diagnosis present

## 2022-02-24 DIAGNOSIS — Z79899 Other long term (current) drug therapy: Secondary | ICD-10-CM

## 2022-02-24 DIAGNOSIS — K760 Fatty (change of) liver, not elsewhere classified: Secondary | ICD-10-CM | POA: Diagnosis present

## 2022-02-24 HISTORY — DX: Rectal abscess: K61.1

## 2022-02-24 LAB — COMPREHENSIVE METABOLIC PANEL
ALT: 28 U/L (ref 0–44)
AST: 21 U/L (ref 15–41)
Albumin: 4.2 g/dL (ref 3.5–5.0)
Alkaline Phosphatase: 58 U/L (ref 38–126)
Anion gap: 8 (ref 5–15)
BUN: 14 mg/dL (ref 6–20)
CO2: 23 mmol/L (ref 22–32)
Calcium: 9.4 mg/dL (ref 8.9–10.3)
Chloride: 106 mmol/L (ref 98–111)
Creatinine, Ser: 1.05 mg/dL (ref 0.61–1.24)
GFR, Estimated: >60 mL/min (ref >60)
Glucose, Bld: 80 mg/dL (ref 70–99)
Potassium: 3.9 mmol/L (ref 3.5–5.1)
Sodium: 137 mmol/L (ref 135–145)
Total Bilirubin: 0.6 mg/dL (ref 0.3–1.2)
Total Protein: 7.7 g/dL (ref 6.5–8.1)

## 2022-02-24 LAB — CBC WITH DIFFERENTIAL/PLATELET
Abs Immature Granulocytes: 0.06 10*3/uL (ref 0.00–0.07)
Basophils Absolute: 0.1 10*3/uL (ref 0.0–0.1)
Basophils Relative: 0 %
Eosinophils Absolute: 0.1 10*3/uL (ref 0.0–0.5)
Eosinophils Relative: 1 %
HCT: 45.5 % (ref 39.0–52.0)
Hemoglobin: 15.3 g/dL (ref 13.0–17.0)
Immature Granulocytes: 0 %
Lymphocytes Relative: 12 %
Lymphs Abs: 1.9 10*3/uL (ref 0.7–4.0)
MCH: 27.7 pg (ref 26.0–34.0)
MCHC: 33.6 g/dL (ref 30.0–36.0)
MCV: 82.4 fL (ref 80.0–100.0)
Monocytes Absolute: 1.6 10*3/uL — ABNORMAL HIGH (ref 0.1–1.0)
Monocytes Relative: 10 %
Neutro Abs: 12.3 10*3/uL — ABNORMAL HIGH (ref 1.7–7.7)
Neutrophils Relative %: 77 %
Platelets: 234 10*3/uL (ref 150–400)
RBC: 5.52 MIL/uL (ref 4.22–5.81)
RDW: 14.2 % (ref 11.5–15.5)
WBC: 16.1 10*3/uL — ABNORMAL HIGH (ref 4.0–10.5)
nRBC: 0 % (ref 0.0–0.2)

## 2022-02-24 LAB — LIPASE, BLOOD: Lipase: 36 U/L (ref 11–51)

## 2022-02-24 MED ORDER — ENOXAPARIN SODIUM 40 MG/0.4ML IJ SOSY
40.0000 mg | PREFILLED_SYRINGE | INTRAMUSCULAR | Status: DC
  Administered 2022-02-25: 40 mg via SUBCUTANEOUS
  Filled 2022-02-24: qty 0.4

## 2022-02-24 MED ORDER — HYDROMORPHONE HCL 1 MG/ML IJ SOLN
1.0000 mg | INTRAMUSCULAR | Status: DC | PRN
  Administered 2022-02-26: 1 mg via INTRAVENOUS
  Filled 2022-02-24: qty 1

## 2022-02-24 MED ORDER — POTASSIUM CHLORIDE IN NACL 20-0.9 MEQ/L-% IV SOLN
INTRAVENOUS | Status: DC
  Filled 2022-02-24 (×2): qty 1000

## 2022-02-24 MED ORDER — MORPHINE SULFATE (PF) 4 MG/ML IV SOLN
4.0000 mg | Freq: Once | INTRAVENOUS | Status: AC
  Administered 2022-02-24: 4 mg via INTRAVENOUS
  Filled 2022-02-24: qty 1

## 2022-02-24 MED ORDER — SODIUM CHLORIDE 0.9 % IV BOLUS
1000.0000 mL | Freq: Once | INTRAVENOUS | Status: AC
Start: 2022-02-24 — End: 2022-02-24
  Administered 2022-02-24: 1000 mL via INTRAVENOUS

## 2022-02-24 MED ORDER — AMLODIPINE BESYLATE 5 MG PO TABS
2.5000 mg | ORAL_TABLET | Freq: Every day | ORAL | Status: DC
  Administered 2022-02-24 – 2022-02-26 (×3): 2.5 mg via ORAL
  Filled 2022-02-24 (×3): qty 1

## 2022-02-24 MED ORDER — OXYCODONE HCL 5 MG PO TABS
5.0000 mg | ORAL_TABLET | ORAL | Status: DC | PRN
  Administered 2022-02-24: 10 mg via ORAL
  Administered 2022-02-25 (×3): 5 mg via ORAL
  Administered 2022-02-26 (×2): 10 mg via ORAL
  Filled 2022-02-24: qty 2
  Filled 2022-02-24 (×2): qty 1
  Filled 2022-02-24 (×3): qty 2

## 2022-02-24 MED ORDER — ONDANSETRON HCL 4 MG/2ML IJ SOLN: 4.0000 mg | Freq: Four times a day (QID) | INTRAMUSCULAR | Status: DC | PRN

## 2022-02-24 MED ORDER — ACETAMINOPHEN 500 MG PO TABS
1000.0000 mg | ORAL_TABLET | Freq: Four times a day (QID) | ORAL | Status: DC
  Administered 2022-02-25 – 2022-02-26 (×6): 1000 mg via ORAL
  Filled 2022-02-24 (×7): qty 2

## 2022-02-24 MED ORDER — IOHEXOL 300 MG/ML  SOLN
100.0000 mL | Freq: Once | INTRAMUSCULAR | Status: AC | PRN
  Administered 2022-02-24: 100 mL via INTRAVENOUS

## 2022-02-24 MED ORDER — ONDANSETRON 4 MG PO TBDP: 4.0000 mg | ORAL_TABLET | Freq: Four times a day (QID) | ORAL | Status: DC | PRN

## 2022-02-24 MED ORDER — SODIUM CHLORIDE 0.9 % IV SOLN
2.0000 g | Freq: Two times a day (BID) | INTRAVENOUS | Status: DC
  Administered 2022-02-24 – 2022-02-26 (×4): 2 g via INTRAVENOUS
  Filled 2022-02-24 (×5): qty 2

## 2022-02-24 NOTE — ED Provider Triage Note (Signed)
Emergency Medicine Provider Triage Evaluation Note  Gregory Fowler , a 46 y.o. male  was evaluated in triage.  Pt complains of constipation and rectal pain.  Patient states that symptoms began progressively over the past week.  He states that he had decreasing bowel movements up until yesterday.  He has not had a bowel movement 24 hours.  He states that normally he has 2-3 bowel movements a day.  He notes no hematochezia or melena.  He was diagnosed with perirectal abscesses twice in the year of 2020.  He states that this presentation feels very similar to how the previous 2 began.  Associated symptoms include abdominal pain and nausea. He denies fever, chills, night sweats, chest pain, shortness of breath, vomiting, urinary symptoms.  Review of Systems  Positive: See above Negative:   Physical Exam  BP 132/77 (BP Location: Left Arm)   Pulse (!) 107   Temp 97.8 F (36.6 C) (Oral)   Resp 17   Ht 6\' 2"  (1.88 m)   Wt 99.8 kg   SpO2 96%   BMI 28.25 kg/m  Gen:   Awake, no distress   Resp:  Normal effort  MSK:   Moves extremities without difficulty  Other:  Diffuse tenderness on abdominal exam.  McBurney point and Murphy sign negative.  No rebound tenderness no guarding.  Medical Decision Making  Medically screening exam initiated at 3:14 PM.  Appropriate orders placed.  Catcher Jaxsen Bernhart was informed that the remainder of the evaluation will be completed by another provider, this initial triage assessment does not replace that evaluation, and the importance of remaining in the ED until their evaluation is complete.     Rudean Hitt, Peter Garter 02/24/22 1517

## 2022-02-24 NOTE — ED Triage Notes (Signed)
Pt states he has history of Anal Fissures, had a Colonoscopy 3 weeks ago and has had increased trouble with constipation since. Fever and Chills reported.Denies N/V

## 2022-02-24 NOTE — ED Provider Notes (Signed)
[facility]-EMERGENCY DEPT Provider Note   CSN: [id number] Arrival date & time: 02/24/22  1440     History  Chief Complaint  Patient presents with   Fever   Anal Fissure    Gregory Fowler is a 46 y.o. male.  46 year old male with prior medical history as detailed below presents for evaluation.  Patient reports increased symptoms of constipation and rectal pain.  Symptoms have worsened over the last 7 days.  Patient reports colonoscopy with Gregory Fowler proximately 3 weeks ago.  Patient reports that his colonoscopy went well -only significant findings were presence of both internal/external hemorrhoids.  Patient with history of perirectal abscess - patient with history of recurrent perirectal abscess most recently operated on by Gregory Fowler. Gregory Fowler in 2020.  Patient reports subjective fever and chills last night.  He is concerned that his symptoms over the last several days are consistent with prior symptoms prior to his diagnosis with perirectal abscess.  He denies abdominal pain, nausea, vomiting, chest pain, shortness of breath, or other complaint.    The history is provided by the patient and medical records.       Home Medications Prior to Admission medications   Medication Sig Start Date End Date Taking? Authorizing Provider  amLODipine (NORVASC) 2.5 MG tablet Take 1 tablet (2.5 mg total) by mouth daily. 01/23/22   Gregory Craze, NP  anastrozole (ARIMIDEX) 1 MG tablet Take by mouth. 05/11/21   [provider]  atorvastatin (LIPITOR) 20 MG tablet Take 1 tablet (20 mg total) by mouth daily. 01/26/22   Gregory Craze, NP  doxycycline (VIBRA-TABS) 100 MG tablet Take 100 mg by mouth 2 (two) times daily. 04/27/21   [provider]  milk thistle 175 MG tablet Take 175 mg by mouth daily.    [provider]  Multiple Vitamin (DAILY VITAMIN PO) Take 1 packet by mouth daily. MEN'S SPORT VITAMIN PACK.  Omega 3, Grape Seed  Extract, Milk Thistle Extract, Tribulus Terrestris, Vit D3.    [provider]  Omega-3 Fatty Acids (FISH OIL PO) Take 1 capsule by mouth daily.    [provider]  pantoprazole (PROTONIX) 40 MG tablet TAKE ONE TABLET BY MOUTH DAILY 01/08/22   Gregory Craze, NP  Probiotic Product (PROBIOTIC DAILY) CAPS Take by mouth daily.    [provider]  Red Yeast Rice Extract (RED YEAST RICE PO) Take 1 tablet by mouth daily.    [provider]  testosterone cypionate (DEPOTESTOSTERONE CYPIONATE) 200 MG/ML injection 1.2 cc 11/16/19   [provider]      Allergies    [name].    Review of Systems   Review of Systems  All other systems reviewed and are negative.   Physical Exam Updated Vital Signs BP 132/77 (BP Location: Left Arm)   Pulse (!) 107   Temp 97.8 F (36.6 C) (Oral)   Resp 17   Ht 6\' 2"  (1.88 m)   Wt 99.8 kg   SpO2 96%   BMI 28.25 kg/m  Physical Exam Vitals and nursing note reviewed.  Constitutional:      General: He is not in acute distress.    Appearance: Normal appearance. He is well-developed.  HENT:     Head: Normocephalic and atraumatic.  Eyes:     Conjunctiva/sclera: Conjunctivae normal.     Pupils: Pupils are equal, round, and reactive to light.  Cardiovascular:     Rate and Rhythm: Normal rate and regular rhythm.  Heart sounds: Normal heart sounds.  Pulmonary:     Effort: Pulmonary effort is normal. No respiratory distress.     Breath sounds: Normal breath sounds.  Abdominal:     General: There is no distension.     Palpations: Abdomen is soft.     Tenderness: There is no abdominal tenderness.  Genitourinary:    Comments: External hemorrhoidal tags present on exam.  Patient declines digital rectal exam secondary to pain. Musculoskeletal:        General: No deformity. Normal range of motion.     Cervical back: Normal range of motion and neck supple.  Skin:    General: Skin is warm and  dry.  Neurological:     General: No focal deficit present.     Mental Status: He is alert and oriented to person, place, and time.     ED Results / Procedures / Treatments   Labs (all labs ordered are listed, but only abnormal results are displayed) Labs Reviewed  CBC WITH DIFFERENTIAL/PLATELET - Abnormal; Notable for the following components:      Result Value   WBC 16.1 (*)    Neutro Abs 12.3 (*)    Monocytes Absolute 1.6 (*)    All other components within normal limits  COMPREHENSIVE METABOLIC PANEL  LIPASE, BLOOD    EKG None  Radiology No results found.  Procedures Procedures    Medications Ordered in ED Medications  sodium chloride 0.9 % bolus 1,000 mL (has no administration in time range)  morphine (PF) 4 MG/ML injection 4 mg (has no administration in time range)    ED Course/ Medical Decision Making/ A&P                           Medical Decision Making Risk Prescription drug management.    Medical Screen Complete  This patient presented to the ED with complaint of rectal pain.  This complaint involves an extensive number of treatment options. The initial differential diagnosis includes, but is not limited to, recurrent perirectal abscess, etc.  This presentation is: Acute, Self-Limited, Previously Undiagnosed, Uncertain Prognosis, Complicated, Systemic Symptoms, and Threat to Life/Bodily Function  Patient with history of recurrent perirectal abscess now presents with similar symptoms.  Patient presentation is consistent with likely recurrent perirectal abscess.  Patient with elevated white count and reported subjective fever at home.  Patient CT imaging reveals evidence of perirectal abscess.  Gregory Fowler with general surgery is aware of case and will evaluate for admission.  Additional history obtained:  External records from outside sources obtained and reviewed including prior ED visits and prior Inpatient records.    Lab Tests:  I  ordered and personally interpreted labs.  The pertinent results include: CBC, CMP, lipase   Imaging Studies ordered:  I ordered imaging studies including CT abdomen pelvis I independently visualized and interpreted obtained imaging which showed perirectal abscess I agree with the radiologist interpretation.   Cardiac Monitoring:  The patient was maintained on a cardiac monitor.  I personally viewed and interpreted the cardiac monitor which showed an underlying rhythm of: NSR   Medicines ordered:  I ordered medication including morphine for pain Reevaluation of the patient after these medicines showed that the patient: improved  Problem List / ED Course:  Perirectal abscess   Reevaluation:  After the interventions noted above, I reevaluated the patient and found that they have: improved Disposition:  After consideration of the diagnostic results and the patients response  to treatment, I feel that the patent would benefit from surgical evaluation and treatment.          Final Clinical Impression(s) / ED Diagnoses Final diagnoses:  Perirectal abscess    Rx / DC Orders ED Discharge Orders     None         Wynetta Fines, MD 02/24/22 1807

## 2022-02-24 NOTE — H&P (Signed)
Gregory Fowler is an 46 y.o. male.   Chief Complaint: Perirectal pain and constipation HPI: This is a 46 year old gentleman who has a previous history of perirectal abscesses over the last several days he is developing increasing perirectal pain and discomfort.  He has had a colonoscopy approximately 3 weeks ago for screening purposes which was unremarkable except for small hemorrhoids.  He has no previous history of a fistula and has no history of inflammatory bowel disease.  He reports subjective fevers.  He is otherwise healthy without complaints.  He has no issues with continence  Past Medical History:  Diagnosis Date   Abdominal pain, other specified site    Backache, unspecified    Fatty liver    Gastroparesis    GERD (gastroesophageal reflux disease)    Hiatal hernia    Kidney stone    Nonspecific elevation of levels of transaminase or lactic acid dehydrogenase (LDH)     Past Surgical History:  Procedure Laterality Date   INCISION AND DRAINAGE PERIRECTAL ABSCESS N/A 04/18/2019   Procedure: IRRIGATION AND DEBRIDEMENT PERIRECTAL ABSCESS EXAM UNDER ANESTHESIA;  Surgeon: Darnell Level, MD;  Location: [facility];  Service: General;  Laterality: N/A;   INCISION AND DRAINAGE PERIRECTAL ABSCESS N/A 07/29/2019   Procedure: IRRIGATION AND DEBRIDEMENT PERIRECTAL ABSCESS;  Surgeon: Gaynelle Adu, MD;  Location: [facility];  Service: General;  Laterality: N/A;   lasix eye surgery Bilateral    TONSILLECTOMY     UPPER GASTROINTESTINAL ENDOSCOPY  2012   Dr.Patterson    Family History  Problem Relation Age of Onset   Liver disease Mother        fatty liver- she is overweight   CAD Father    CAD Paternal Grandfather        bypass   CAD Paternal Uncle        died from MI   CAD Paternal Uncle        died from MI   CAD Paternal Great-grandfather    Colon cancer Neg Hx    Colon polyps Neg Hx    Diabetes Neg Hx    Kidney disease Neg Hx    Gallbladder disease Neg Hx    Esophageal cancer Neg  Hx    Social History:  reports that he has been smoking cigarettes. He started smoking about 4 years ago. He has a 30.00 pack-year smoking history. He has quit using smokeless tobacco.  His smokeless tobacco use included chew. He reports current alcohol use. He reports that he does not use drugs.  Allergies: No Known Allergies  (Not in a hospital admission)   Results for orders placed or performed during the hospital encounter of 02/24/22 (from the past 48 hour(s))  CBC with Differential     Status: Abnormal   Collection Time: 02/24/22  3:42 PM  Result Value Ref Range   WBC 16.1 (H) 4.0 - 10.5 K/uL   RBC 5.52 4.22 - 5.81 MIL/uL   Hemoglobin 15.3 13.0 - 17.0 g/dL   HCT 37.8 58.8 - 50.2 %   MCV 82.4 80.0 - 100.0 fL   MCH 27.7 26.0 - 34.0 pg   MCHC 33.6 30.0 - 36.0 g/dL   RDW 77.4 12.8 - 78.6 %   Platelets 234 150 - 400 K/uL   nRBC 0.0 0.0 - 0.2 %   Neutrophils Relative % 77 %   Neutro Abs 12.3 (H) 1.7 - 7.7 K/uL   Lymphocytes Relative 12 %   Lymphs Abs 1.9 0.7 - 4.0 K/uL  Monocytes Relative 10 %   Monocytes Absolute 1.6 (H) 0.1 - 1.0 K/uL   Eosinophils Relative 1 %   Eosinophils Absolute 0.1 0.0 - 0.5 K/uL   Basophils Relative 0 %   Basophils Absolute 0.1 0.0 - 0.1 K/uL   Immature Granulocytes 0 %   Abs Immature Granulocytes 0.06 0.00 - 0.07 K/uL    Comment: Performed at [facility], [address], [place], [place] [address]  Comprehensive metabolic panel     Status: None   Collection Time: 02/24/22  3:42 PM  Result Value Ref Range   Sodium 137 135 - 145 mmol/L   Potassium 3.9 3.5 - 5.1 mmol/L   Chloride 106 98 - 111 mmol/L   CO2 23 22 - 32 mmol/L   Glucose, Bld 80 70 - 99 mg/dL    Comment: Glucose reference range applies only to samples taken after fasting for at least 8 hours.   BUN 14 6 - 20 mg/dL   Creatinine, Ser 6.21 0.61 - 1.24 mg/dL   Calcium 9.4 8.9 - 30.8 mg/dL   Total Protein 7.7 6.5 - 8.1 g/dL   Albumin 4.2 3.5 - 5.0 g/dL   AST 21 15 -  41 U/L   ALT 28 0 - 44 U/L   Alkaline Phosphatase 58 38 - 126 U/L   Total Bilirubin 0.6 0.3 - 1.2 mg/dL   GFR, Estimated >65 >78 mL/min    Comment: (NOTE) Calculated using the CKD-EPI Creatinine Equation (2021)    Anion gap 8 5 - 15    Comment: Performed at [facility], [address], [place], [place] [address]  Lipase, blood     Status: None   Collection Time: 02/24/22  3:42 PM  Result Value Ref Range   Lipase 36 11 - 51 U/L    Comment: Performed at [facility], [address] [place], [place] [address]   CT Abdomen Pelvis W Contrast  Result Date: 02/24/2022 CLINICAL DATA:  46 year old male with acute abdominal and pelvic pain and fever. EXAM: CT ABDOMEN AND PELVIS WITH CONTRAST TECHNIQUE: Multidetector CT imaging of the abdomen and pelvis was performed using the standard protocol following bolus administration of intravenous contrast. RADIATION DOSE REDUCTION: This exam was performed according to the departmental dose-optimization program which includes automated exposure control, adjustment of the mA and/or kV according to patient size and/or use of iterative reconstruction technique. CONTRAST:  OMNIPAQUE IOHEXOL 300 MG/ML  SOLN COMPARISON:  07/28/2019 and prior CTs FINDINGS: Lower chest: No acute abnormality. Mild dependent opacities are unchanged and may represent atelectasis or interstitial opacities/lung disease. Hepatobiliary: The liver and gallbladder are unremarkable. There is no evidence of intrahepatic or extrahepatic biliary dilatation. Pancreas: Unremarkable Spleen: Unremarkable Adrenals/Urinary Tract: The kidneys are unremarkable except for multiple low-density too small to characterize hypodense lesions bilaterally, probably cysts. There is no evidence of hydronephrosis or urinary calculi. The adrenal glands and bladder are unremarkable. Stomach/Bowel: Stomach is within normal limits. Appendix appears normal. No evidence of bowel wall  thickening, distention, or inflammatory changes. A 3 x 3.2 cm posterior perianal/perirectal collection/abscess is identified. Vascular/Lymphatic: Aortic atherosclerosis. No enlarged abdominal or pelvic lymph nodes. Reproductive: No significant change Other: No ascites or pneumoperitoneum. Musculoskeletal: No acute or suspicious bony abnormalities are noted. IMPRESSION: 1. 3 x 3.2 cm posterior perianal/perirectal abscess. 2. Aortic Atherosclerosis (ICD10-I70.0). Electronically Signed   By: Harmon Pier M.D.   On: 02/24/2022 17:31    Review of Systems  Constitutional:  Positive  for fever.  Gastrointestinal:  Positive for rectal pain. Negative for abdominal pain.  All other systems reviewed and are negative.   Blood pressure 138/84, pulse 94, temperature 97.8 F (36.6 C), temperature source Oral, resp. rate 18, height 6\' 2"  (1.88 m), weight 99.8 kg, SpO2 98 %. Physical Exam Constitutional:      Appearance: Normal appearance. He is not ill-appearing, toxic-appearing or diaphoretic.  HENT:     Head: Normocephalic and atraumatic.     Nose: Nose normal.  Cardiovascular:     Rate and Rhythm: Normal rate and regular rhythm.  Pulmonary:     Effort: Pulmonary effort is normal.     Breath sounds: Normal breath sounds.  Abdominal:     General: Abdomen is flat. There is no distension.     Tenderness: There is no abdominal tenderness.  Genitourinary:    Comments: I cannot do a rectal exam secondary to his discomfort.  He does have a superficial abscess with an old scar at the posterior midline.  There is no obvious fistula or fissure.  There are small skin tags Musculoskeletal:        General: Normal range of motion.     Cervical back: Normal range of motion.  Skin:    General: Skin is warm and dry.  Neurological:     General: No focal deficit present.     Mental Status: He is alert and oriented to person, place, and time.  Psychiatric:        Mood and Affect: Mood normal.        Behavior:  Behavior normal.      Assessment/Plan Recurrent perirectal abscess  We again discussed the diagnosis.  I have reviewed his CT scan of the abdomen pelvis showing 3 cm abscess in the posterior midline.  He is too tender to attempted incision and drainage at the bedside.  We discussed the diagnosis in detail.  Plan will be to proceed to the operating room tomorrow morning for an incision and drainage under anesthesia which will allow better visualization to rule out a perianal fistula.  I discussed the risk which includes but is not limited to bleeding, ongoing infection, need for other procedures, issues with continence, etc.  He is being admitted and placed on IV antibiotics.  He and his family agree with plans  , MD 02/24/2022, 7:17 PM

## 2022-02-25 ENCOUNTER — Encounter (HOSPITAL_COMMUNITY): Admission: EM | Disposition: A | Discharge status: Home / Self Care | Payer: Self-pay | Source: Home / Self Care

## 2022-02-25 ENCOUNTER — Observation Stay (HOSPITAL_COMMUNITY): Payer: Managed Care, Other (non HMO) | Admitting: Certified Registered"

## 2022-02-25 ENCOUNTER — Encounter (HOSPITAL_COMMUNITY): Payer: Self-pay | Admitting: Surgery

## 2022-02-25 ENCOUNTER — Other Ambulatory Visit: Payer: Self-pay

## 2022-02-25 DIAGNOSIS — I10 Essential (primary) hypertension: Secondary | ICD-10-CM | POA: Diagnosis not present

## 2022-02-25 DIAGNOSIS — K760 Fatty (change of) liver, not elsewhere classified: Secondary | ICD-10-CM | POA: Diagnosis present

## 2022-02-25 DIAGNOSIS — N289 Disorder of kidney and ureter, unspecified: Secondary | ICD-10-CM | POA: Diagnosis not present

## 2022-02-25 DIAGNOSIS — Z79899 Other long term (current) drug therapy: Secondary | ICD-10-CM | POA: Diagnosis not present

## 2022-02-25 DIAGNOSIS — K644 Residual hemorrhoidal skin tags: Secondary | ICD-10-CM | POA: Diagnosis present

## 2022-02-25 DIAGNOSIS — K3184 Gastroparesis: Secondary | ICD-10-CM | POA: Diagnosis present

## 2022-02-25 DIAGNOSIS — Z87442 Personal history of urinary calculi: Secondary | ICD-10-CM | POA: Diagnosis not present

## 2022-02-25 DIAGNOSIS — K611 Rectal abscess: Secondary | ICD-10-CM

## 2022-02-25 DIAGNOSIS — K219 Gastro-esophageal reflux disease without esophagitis: Secondary | ICD-10-CM | POA: Diagnosis present

## 2022-02-25 DIAGNOSIS — F17210 Nicotine dependence, cigarettes, uncomplicated: Secondary | ICD-10-CM

## 2022-02-25 DIAGNOSIS — I700 Atherosclerosis of aorta: Secondary | ICD-10-CM | POA: Diagnosis present

## 2022-02-25 DIAGNOSIS — K5900 Constipation, unspecified: Secondary | ICD-10-CM | POA: Diagnosis present

## 2022-02-25 HISTORY — PX: INCISION AND DRAINAGE ABSCESS: SHX5864

## 2022-02-25 LAB — BASIC METABOLIC PANEL
Anion gap: 7 (ref 5–15)
BUN: 13 mg/dL (ref 6–20)
CO2: 24 mmol/L (ref 22–32)
Calcium: 8.3 mg/dL — ABNORMAL LOW (ref 8.9–10.3)
Chloride: 104 mmol/L (ref 98–111)
Creatinine, Ser: 1.12 mg/dL (ref 0.61–1.24)
GFR, Estimated: >60 mL/min (ref >60)
Glucose, Bld: 132 mg/dL — ABNORMAL HIGH (ref 70–99)
Potassium: 3.7 mmol/L (ref 3.5–5.1)
Sodium: 135 mmol/L (ref 135–145)

## 2022-02-25 LAB — CBC
HCT: 41.3 % (ref 39.0–52.0)
Hemoglobin: 13.8 g/dL (ref 13.0–17.0)
MCH: 27.8 pg (ref 26.0–34.0)
MCHC: 33.4 g/dL (ref 30.0–36.0)
MCV: 83.3 fL (ref 80.0–100.0)
Platelets: 197 10*3/uL (ref 150–400)
RBC: 4.96 MIL/uL (ref 4.22–5.81)
RDW: 14.2 % (ref 11.5–15.5)
WBC: 13.2 10*3/uL — ABNORMAL HIGH (ref 4.0–10.5)
nRBC: 0 % (ref 0.0–0.2)

## 2022-02-25 LAB — HIV ANTIBODY (ROUTINE TESTING W REFLEX): HIV Screen 4th Generation wRfx: NONREACTIVE

## 2022-02-25 SURGERY — INCISION AND DRAINAGE, ABSCESS
Anesthesia: General | Site: Rectum

## 2022-02-25 MED ORDER — HYDROMORPHONE HCL 1 MG/ML IJ SOLN: 0.2500 mg | INTRAMUSCULAR | Status: DC | PRN

## 2022-02-25 MED ORDER — BUPIVACAINE LIPOSOME 1.3 % IJ SUSP
INTRAMUSCULAR | Status: AC
  Filled 2022-02-25: qty 20

## 2022-02-25 MED ORDER — MIDAZOLAM HCL 5 MG/5ML IJ SOLN
INTRAMUSCULAR | Status: DC | PRN
  Administered 2022-02-25: 2 mg via INTRAVENOUS

## 2022-02-25 MED ORDER — LIDOCAINE 2% (20 MG/ML) 5 ML SYRINGE
INTRAMUSCULAR | Status: DC | PRN
  Administered 2022-02-25: 100 mg via INTRAVENOUS

## 2022-02-25 MED ORDER — FENTANYL CITRATE (PF) 100 MCG/2ML IJ SOLN
INTRAMUSCULAR | Status: AC
  Filled 2022-02-25: qty 2

## 2022-02-25 MED ORDER — 0.9 % SODIUM CHLORIDE (POUR BTL) OPTIME
TOPICAL | Status: DC | PRN
  Administered 2022-02-25: 1000 mL

## 2022-02-25 MED ORDER — PROPOFOL 10 MG/ML IV BOLUS
INTRAVENOUS | Status: AC
  Filled 2022-02-25: qty 20

## 2022-02-25 MED ORDER — PROPOFOL 10 MG/ML IV BOLUS
INTRAVENOUS | Status: DC | PRN
  Administered 2022-02-25: 200 mg via INTRAVENOUS

## 2022-02-25 MED ORDER — HYDROMORPHONE HCL 1 MG/ML IJ SOLN
INTRAMUSCULAR | Status: AC
  Filled 2022-02-25: qty 2

## 2022-02-25 MED ORDER — BUPIVACAINE LIPOSOME 1.3 % IJ SUSP
INTRAMUSCULAR | Status: DC | PRN
  Administered 2022-02-25: 20 mL

## 2022-02-25 MED ORDER — LACTATED RINGERS IV SOLN: INTRAVENOUS | Status: DC | PRN

## 2022-02-25 MED ORDER — ONDANSETRON HCL 4 MG/2ML IJ SOLN
INTRAMUSCULAR | Status: AC
  Filled 2022-02-25: qty 2

## 2022-02-25 MED ORDER — DEXAMETHASONE SODIUM PHOSPHATE 10 MG/ML IJ SOLN
INTRAMUSCULAR | Status: AC
  Filled 2022-02-25: qty 1

## 2022-02-25 MED ORDER — LIDOCAINE HCL (PF) 2 % IJ SOLN
INTRAMUSCULAR | Status: AC
  Filled 2022-02-25: qty 5

## 2022-02-25 MED ORDER — ONDANSETRON HCL 4 MG/2ML IJ SOLN
INTRAMUSCULAR | Status: DC | PRN
  Administered 2022-02-25: 4 mg via INTRAVENOUS

## 2022-02-25 MED ORDER — BUPIVACAINE-EPINEPHRINE 0.25% -1:200000 IJ SOLN
INTRAMUSCULAR | Status: DC | PRN
  Administered 2022-02-25: 30 mL

## 2022-02-25 MED ORDER — MIDAZOLAM HCL 2 MG/2ML IJ SOLN
INTRAMUSCULAR | Status: AC
  Filled 2022-02-25: qty 2

## 2022-02-25 MED ORDER — FENTANYL CITRATE (PF) 100 MCG/2ML IJ SOLN
INTRAMUSCULAR | Status: DC | PRN
  Administered 2022-02-25: 100 ug via INTRAVENOUS

## 2022-02-25 MED ORDER — BUPIVACAINE-EPINEPHRINE (PF) 0.25% -1:200000 IJ SOLN
INTRAMUSCULAR | Status: AC
  Filled 2022-02-25: qty 30

## 2022-02-25 SURGICAL SUPPLY — 29 items
BAG COUNTER SPONGE SURGICOUNT (BAG) IMPLANT
BLADE SURG 15 STRL LF DISP TIS (BLADE) ×1 IMPLANT
BLADE SURG 15 STRL SS (BLADE) ×2
BNDG GAUZE DERMACEA FLUFF (GAUZE/BANDAGES/DRESSINGS)
BNDG GAUZE DERMACEA FLUFF 4 (GAUZE/BANDAGES/DRESSINGS) IMPLANT
COVER SURGICAL LIGHT HANDLE (MISCELLANEOUS) ×2 IMPLANT
DRAPE LAPAROSCOPIC ABDOMINAL (DRAPES) IMPLANT
DRSG PAD ABDOMINAL 8X10 ST (GAUZE/BANDAGES/DRESSINGS) IMPLANT
ELECT REM PT RETURN 15FT ADLT (MISCELLANEOUS) ×2 IMPLANT
GAUZE SPONGE 4X4 12PLY STRL (GAUZE/BANDAGES/DRESSINGS) ×1 IMPLANT
GLOVE BIO SURGEON STRL SZ7.5 (GLOVE) ×2 IMPLANT
GOWN STRL REUS W/ TWL XL LVL3 (GOWN DISPOSABLE) IMPLANT
GOWN STRL REUS W/TWL XL LVL3 (GOWN DISPOSABLE)
KIT BASIN OR (CUSTOM PROCEDURE TRAY) ×2 IMPLANT
KIT TURNOVER KIT A (KITS) IMPLANT
NEEDLE HYPO 22GX1.5 SAFETY (NEEDLE) IMPLANT
NS IRRIG 1000ML POUR BTL (IV SOLUTION) ×2 IMPLANT
PACK BASIC VI WITH GOWN DISP (CUSTOM PROCEDURE TRAY) ×2 IMPLANT
PENCIL SMOKE EVACUATOR (MISCELLANEOUS) IMPLANT
SPIKE FLUID TRANSFER (MISCELLANEOUS) IMPLANT
SUT MNCRL AB 4-0 PS2 18 (SUTURE) IMPLANT
SUT VIC AB 3-0 SH 27 (SUTURE)
SUT VIC AB 3-0 SH 27XBRD (SUTURE) IMPLANT
SWAB COLLECTION DEVICE MRSA (MISCELLANEOUS) IMPLANT
SWAB CULTURE ESWAB REG 1ML (MISCELLANEOUS) IMPLANT
SYR 20ML LL LF (SYRINGE) IMPLANT
TOWEL OR 17X26 10 PK STRL BLUE (TOWEL DISPOSABLE) ×2 IMPLANT
TOWEL OR NON WOVEN STRL DISP B (DISPOSABLE) ×2 IMPLANT
YANKAUER SUCT BULB TIP NO VENT (SUCTIONS) ×2 IMPLANT

## 2022-02-25 NOTE — Progress Notes (Signed)
Pt back from PACU in stable condition. No needs at time of return to floor. Vs and assessment performed. Rn will continue to monitor.

## 2022-02-25 NOTE — Op Note (Signed)
   Gregory Fowler 02/25/2022   Pre-op Diagnosis: perirectal abscess     Post-op Diagnosis: same  Procedure(s): EXAM UNDER ANESTHESIA INCISION AND DRAINAGE PERI-RECTAL ABSCESS  Surgeon(s): Abigail Miyamoto, MD  Anesthesia: Choice  Staff:  Circulator: Gordy Clement, RN Scrub Person: Clarnce Flock, CST Circulator Assistant: Kem Parkinson, RN  Estimated Blood Loss: Minimal               Findings: The patient had a simple 3 centimeters abscess in the posterior midline.  Hemorrhoids were slightly enlarged.  No evidence of an anal fistula was identified  Procedure: The patient was brought to operating identifies the correct patient.  He was placed upon the operating table general anesthesia was induced.  He was next placed in the lithotomy position.  His perianal area was prepped and draped in usual sterile fashion.  I inserted a retractor into the anal canal and inspected the canal circumferentially.  Other than enlarged hemorrhoids I saw no other abnormalities.  I anesthetized the area with a mixture of Exparel and Marcaine.  I then made an incision at the posterior midline and entered the abscess cavity.  It contained only purulence with no evidence of stool.  I irrigated the abscess cavity out.  The visual inspection of the anus and the abscess, I could not demonstrate any fistula.  I anesthetized the area further with the mixture of Marcaine and Exparel.  Hemostasis was achieved with cautery.  I then packed the abscess cavity with a wet-to-dry saline soaked 4 x 4 gauze.  Dry gauze was then placed over this.  The patient tolerated the procedure well.  All the counts were correct at the end of the procedure.  He was then extubated in the operating room and taken in stable condition to the recovery room.          Abigail Miyamoto   Date: 02/25/2022  Time: 7:45 AM

## 2022-02-25 NOTE — Transfer of Care (Signed)
Immediate Anesthesia Transfer of Care Note  Patient: Gregory Fowler  Procedure(s) Performed: INCISION AND DRAINAGE PERI-RECTAL ABSCESS (Rectum)  Patient Location: PACU  Anesthesia Type:General  Level of Consciousness: awake, alert  and oriented  Airway & Oxygen Therapy: Patient Spontanous Breathing and Patient connected to face mask oxygen  Post-op Assessment: Report given to RN and Post -op Vital signs reviewed and stable  Post vital signs: Reviewed and stable  Last Vitals:  Vitals Value Taken Time  BP 103/59 02/25/22 0800  Temp 37.4 C 02/25/22 0759  Pulse 95 02/25/22 0803  Resp 21 02/25/22 0803  SpO2 90 % 02/25/22 0803  Vitals shown include unvalidated device data.  Last Pain:  Vitals:   02/25/22 0759  TempSrc:   PainSc: Asleep      Patients Stated Pain Goal: 4 (02/24/22 2028)  Complications: No notable events documented.

## 2022-02-25 NOTE — Plan of Care (Signed)
  Problem: Clinical Measurements: Goal: Respiratory complications will improve Outcome: Progressing   Problem: Clinical Measurements: Goal: Cardiovascular complication will be avoided Outcome: Progressing   Problem: Pain Managment: Goal: General experience of comfort will improve Outcome: Progressing   Problem: Education: Goal: Knowledge of General Education information will improve Description: Including pain rating scale, medication(s)/side effects and non-pharmacologic comfort measures Outcome: Progressing

## 2022-02-25 NOTE — Anesthesia Procedure Notes (Signed)
Procedure Name: LMA Insertion Date/Time: 02/25/2022 7:29 AM  Performed by: Rhylin Venters D, CRNAPre-anesthesia Checklist: Patient identified, Emergency Drugs available, Suction available and Patient being monitored Patient Re-evaluated:Patient Re-evaluated prior to induction Oxygen Delivery Method: Circle system utilized Preoxygenation: Pre-oxygenation with 100% oxygen Induction Type: IV induction Ventilation: Mask ventilation without difficulty LMA: LMA inserted LMA Size: 5.0 Tube type: Oral Number of attempts: 1 Placement Confirmation: positive ETCO2 and breath sounds checked- equal and bilateral Tube secured with: Tape Dental Injury: Teeth and Oropharynx as per pre-operative assessment

## 2022-02-25 NOTE — Anesthesia Preprocedure Evaluation (Addendum)
Anesthesia Evaluation  Patient identified by MRN, date of birth, ID band Patient awake    Reviewed: Allergy & Precautions, NPO status , Patient's Chart, lab work & pertinent test results  Airway Mallampati: II  TM Distance: >3 FB     Dental   Pulmonary neg pulmonary ROS, Current Smoker and Patient abstained from smoking.,    breath sounds clear to auscultation       Cardiovascular hypertension,  Rhythm:Regular Rate:Normal     Neuro/Psych  Headaches,  Neuromuscular disease    GI/Hepatic Neg liver ROS, hiatal hernia, GERD  ,  Endo/Other    Renal/GU Renal disease     Musculoskeletal   Abdominal   Peds  Hematology   Anesthesia Other Findings   Reproductive/Obstetrics                             Anesthesia Physical Anesthesia Plan  ASA: 3  Anesthesia Plan: General   Post-op Pain Management:    Induction: Intravenous  PONV Risk Score and Plan: 2 and Ondansetron, Dexamethasone, Midazolam and Treatment may vary due to age or medical condition  Airway Management Planned: LMA  Additional Equipment:   Intra-op Plan:   Post-operative Plan:   Informed Consent: I have reviewed the patients History and Physical, chart, labs and discussed the procedure including the risks, benefits and alternatives for the proposed anesthesia with the patient or authorized representative who has indicated his/her understanding and acceptance.     Dental advisory given  Plan Discussed with: CRNA and Anesthesiologist  Anesthesia Plan Comments:        Anesthesia Quick Evaluation

## 2022-02-25 NOTE — Progress Notes (Signed)
Pt down in pacu at time of bedside report.  

## 2022-02-25 NOTE — Anesthesia Postprocedure Evaluation (Signed)
Anesthesia Post Note  Patient: Gregory Fowler  Procedure(s) Performed: INCISION AND DRAINAGE PERI-RECTAL ABSCESS (Rectum)     Patient location during evaluation: PACU Anesthesia Type: General Level of consciousness: awake Pain management: pain level controlled Vital Signs Assessment: post-procedure vital signs reviewed and stable Respiratory status: spontaneous breathing Cardiovascular status: stable Postop Assessment: no apparent nausea or vomiting Anesthetic complications: no   No notable events documented.  Last Vitals:  Vitals:   02/25/22 0759 02/25/22 0800  BP: (!) 103/59 (!) 103/59  Pulse:  88  Resp: 19 17  Temp: 37.4 C   SpO2: 96% 92%    Last Pain:  Vitals:   02/25/22 0800  TempSrc:   PainSc: Asleep                 Hansel Devan

## 2022-02-26 ENCOUNTER — Encounter (HOSPITAL_COMMUNITY): Payer: Self-pay | Admitting: Surgery

## 2022-02-26 MED ORDER — AMOXICILLIN-POT CLAVULANATE 875-125 MG PO TABS: 1.0000 | ORAL_TABLET | Freq: Two times a day (BID) | ORAL | 0 refills | Status: AC

## 2022-02-26 MED ORDER — ACETAMINOPHEN 500 MG PO TABS: 1000.0000 mg | ORAL_TABLET | Freq: Three times a day (TID) | ORAL | Status: DC | PRN

## 2022-02-26 MED ORDER — IBUPROFEN 600 MG PO TABS: 600.0000 mg | ORAL_TABLET | Freq: Four times a day (QID) | ORAL | 0 refills | Status: DC | PRN

## 2022-02-26 MED ORDER — DOCUSATE SODIUM 100 MG PO CAPS: 100.0000 mg | ORAL_CAPSULE | Freq: Two times a day (BID) | ORAL | 2 refills | Status: DC | PRN

## 2022-02-26 MED ORDER — OXYCODONE HCL 5 MG PO TABS: 5.0000 mg | ORAL_TABLET | Freq: Four times a day (QID) | ORAL | 0 refills | Status: DC | PRN

## 2022-02-26 NOTE — Plan of Care (Signed)

## 2022-02-26 NOTE — Progress Notes (Signed)
  Transition of Care [facility]) Screening Note   Patient Details  Name: Gregory Fowler Date of Birth: 18-Feb-1976   Transition of Care [facility]) CM/SW Contact:    Amada Jupiter, LCSW Phone Number: 02/26/2022, 9:22 AM    Transition of Care Department [facility]) has reviewed patient and no TOC needs have been identified at this time. We will continue to monitor patient advancement through interdisciplinary progression rounds. If new patient transition needs arise, please place a TOC consult.

## 2022-02-26 NOTE — Plan of Care (Signed)
Problem: Clinical Measurements: Goal: Ability to maintain clinical measurements within normal limits will improve Outcome: Progressing   Problem: Nutrition: Goal: Adequate nutrition will be maintained Outcome: Progressing   Problem: Elimination: Goal: Will not experience complications related to bowel motility Outcome: Progressing   Stevphen Meuse Tomeika Weinmann, RNt 02/26/22 1:31 PM

## 2022-02-26 NOTE — Discharge Summary (Signed)
[facility] Discharge Summary   Patient ID: Gregory Fowler MRN: [id number] DOB/AGE: 46-Oct-1977 46 y.o.  Admit date: 02/24/2022 Discharge date: 02/26/2022  Admitting Diagnosis: Perirectal abscess  Discharge Diagnosis Perirectal abscess  Consultants None  Imaging: CT Abdomen Pelvis W Contrast  Result Date: 02/24/2022 CLINICAL DATA:  46 year old male with acute abdominal and pelvic pain and fever. EXAM: CT ABDOMEN AND PELVIS WITH CONTRAST TECHNIQUE: Multidetector CT imaging of the abdomen and pelvis was performed using the standard protocol following bolus administration of intravenous contrast. RADIATION DOSE REDUCTION: This exam was performed according to the departmental dose-optimization program which includes automated exposure control, adjustment of the mA and/or kV according to patient size and/or use of iterative reconstruction technique. CONTRAST:  OMNIPAQUE IOHEXOL 300 MG/ML  SOLN COMPARISON:  07/28/2019 and prior CTs FINDINGS: Lower chest: No acute abnormality. Mild dependent opacities are unchanged and may represent atelectasis or interstitial opacities/lung disease. Hepatobiliary: The liver and gallbladder are unremarkable. There is no evidence of intrahepatic or extrahepatic biliary dilatation. Pancreas: Unremarkable Spleen: Unremarkable Adrenals/Urinary Tract: The kidneys are unremarkable except for multiple low-density too small to characterize hypodense lesions bilaterally, probably cysts. There is no evidence of hydronephrosis or urinary calculi. The adrenal glands and bladder are unremarkable. Stomach/Bowel: Stomach is within normal limits. Appendix appears normal. No evidence of bowel wall thickening, distention, or inflammatory changes. A 3 x 3.2 cm posterior perianal/perirectal collection/abscess is identified. Vascular/Lymphatic: Aortic atherosclerosis. No enlarged abdominal or pelvic lymph nodes. Reproductive: No significant change Other: No ascites or  pneumoperitoneum. Musculoskeletal: No acute or suspicious bony abnormalities are noted. IMPRESSION: 1. 3 x 3.2 cm posterior perianal/perirectal abscess. 2. Aortic Atherosclerosis (ICD10-I70.0). Electronically Signed   By: Harmon Pier M.D.   On: 02/24/2022 17:31    Procedures Dr. Abigail Miyamoto (02/25/22) - I&D  Hospital Course:  Patient is a 46 year old male who presented to the ED with perirectal pain.  Workup showed perirectal abscess.  Patient was admitted and underwent procedure listed above.  Tolerated procedure well and was transferred to the floor.  Diet was advanced as tolerated.  On POD1, the packing was removed and patient and felt stable for discharge home.  Patient will follow up in our office in 2 weeks and knows to call with questions or concerns.  He will call to confirm appointment date/time.    Physical Exam: General:  Alert, NAD, pleasant, comfortable Abd:  Soft, ND, NT GU: packing removed without complication, minimal surrounding cellulitis   I or a member of my team have reviewed this patient in the Controlled Substance Database.   Allergies as of 02/26/2022   No Known Allergies      Medication List     TAKE these medications    acetaminophen 500 MG tablet Commonly known as: TYLENOL Take 2 tablets (1,000 mg total) by mouth every 8 (eight) hours as needed for mild pain or fever.   amLODipine 2.5 MG tablet Commonly known as: NORVASC Take 1 tablet (2.5 mg total) by mouth daily.   amoxicillin-clavulanate 875-125 MG tablet Commonly known as: AUGMENTIN Take 1 tablet by mouth 2 (two) times daily for 7 days.   anastrozole 1 MG tablet Commonly known as: ARIMIDEX Take 0.5 mg by mouth See admin instructions. Take 0.5 mg by mouth every other Thursday   atorvastatin 20 MG tablet Commonly known as: LIPITOR Take 1 tablet (20 mg total) by mouth daily.   Co Q10 200 MG Caps Take 200 mg by mouth daily.   docusate sodium  100 MG capsule Commonly known as: Colace Take  1 capsule (100 mg total) by mouth 2 (two) times daily as needed for mild constipation.   doxycycline 100 MG capsule Commonly known as: VIBRAMYCIN Take 100 mg by mouth 2 (two) times daily.   FISH OIL PO Take 1 capsule by mouth daily.   fluticasone 50 MCG/ACT nasal spray Commonly known as: FLONASE Place 1-2 sprays into both nostrils daily.   ibuprofen 600 MG tablet Commonly known as: ADVIL Take 1 tablet (600 mg total) by mouth every 6 (six) hours as needed for moderate pain. Take with food   MELATONIN GUMMIES PO Take 2 tablets by mouth at bedtime.   milk thistle 175 MG tablet Take 175 mg by mouth daily.   multivitamin Tabs tablet Take 1 tablet by mouth daily with breakfast.   oxyCODONE 5 MG immediate release tablet Commonly known as: Oxy IR/ROXICODONE Take 1 tablet (5 mg total) by mouth every 6 (six) hours as needed for moderate pain or severe pain.   pantoprazole 40 MG tablet Commonly known as: PROTONIX TAKE ONE TABLET BY MOUTH DAILY What changed: when to take this   Probiotic Daily Caps Take 1 capsule by mouth daily.   RED YEAST RICE PO Take 2 capsules by mouth daily.   testosterone cypionate 200 MG/ML injection Commonly known as: DEPOTESTOSTERONE CYPIONATE Inject 240 mg into the muscle every Thursday.          Follow-up Information     [facility], [facility]. Go on 03/15/2022.   Specialty: General Surgery Why: 3:45 PM. Please arrive 30 min prior to appointment time for check in. Bring ID and insurance information with you. Contact information: [address] [place] [address] [phone]                 Signed: Juliet Rude , [facility] 02/26/2022, 3:02 PM Please see Amion for pager number during day hours 7:00am-4:30pm

## 2022-02-26 NOTE — Progress Notes (Signed)
Provided discharge education/instructions, all questions and concerns addressed. Pt not in acute distress, discharged home with belongings accompanied by his mother.

## 2022-02-27 ENCOUNTER — Other Ambulatory Visit: Payer: Self-pay | Admitting: *Deleted

## 2022-02-27 NOTE — Patient Outreach (Signed)
  Care Coordination [facility] Note Transition Care Management Follow-up Telephone Call Date of discharge and from where: [phone] [place] How have you been since you were released from the hospital? Feeling better Any questions or concerns? No  Items Reviewed: Did the pt receive and understand the discharge instructions provided? Yes  Medications obtained and verified? Yes  Other? No  Any new allergies since your discharge? No  Dietary orders reviewed? No Do you have support at home? Yes   Home Care and Equipment/Supplies: Were home health services ordered? N/a If so, what is the name of the agency? N/a  Has the agency set up a time to come to the patient's home? not applicable Were any new equipment or medical supplies ordered?  No What is the name of the medical supply agency? N/a Were you able to get the supplies/equipment? not applicable Do you have any questions related to the use of the equipment or supplies? No  Functional Questionnaire: (I = Independent and D = Dependent) ADLs: I  Bathing/Dressing- I  Meal Prep- I  Eating- I  Maintaining continence- I  Transferring/Ambulation- I  Managing Meds- I  Follow up appointments reviewed:  PCP Hospital f/u appt confirmed? No   Specialist Hospital f/u appt confirmed? Yes  Scheduled to see [facility] [phone] at3:45 Are transportation arrangements needed? No  If their condition worsens, is the pt aware to call PCP or go to the Emergency Dept.? Yes Was the patient provided with contact information for the PCP's office or ED? Yes Was to pt encouraged to call back with questions or concerns? Yes  SDOH assessments and interventions completed:   Yes  Care Coordination Interventions Activated:  No Care Coordination Interventions:   n/a  Encounter Outcome:  Pt. Visit Completed

## 2022-03-15 ENCOUNTER — Ambulatory Visit: Payer: Managed Care, Other (non HMO) | Admitting: Cardiology

## 2022-03-21 ENCOUNTER — Other Ambulatory Visit: Payer: Self-pay

## 2022-03-21 DIAGNOSIS — N200 Calculus of kidney: Secondary | ICD-10-CM | POA: Insufficient documentation

## 2022-03-21 DIAGNOSIS — K449 Diaphragmatic hernia without obstruction or gangrene: Secondary | ICD-10-CM | POA: Insufficient documentation

## 2022-03-21 DIAGNOSIS — R7989 Other specified abnormal findings of blood chemistry: Secondary | ICD-10-CM | POA: Insufficient documentation

## 2022-03-21 DIAGNOSIS — E291 Testicular hypofunction: Secondary | ICD-10-CM | POA: Insufficient documentation

## 2022-03-21 DIAGNOSIS — M2550 Pain in unspecified joint: Secondary | ICD-10-CM | POA: Insufficient documentation

## 2022-03-21 DIAGNOSIS — K760 Fatty (change of) liver, not elsewhere classified: Secondary | ICD-10-CM | POA: Insufficient documentation

## 2022-03-21 DIAGNOSIS — R7402 Elevation of levels of lactic acid dehydrogenase (LDH): Secondary | ICD-10-CM | POA: Insufficient documentation

## 2022-03-21 DIAGNOSIS — Z6829 Body mass index (BMI) 29.0-29.9, adult: Secondary | ICD-10-CM | POA: Insufficient documentation

## 2022-03-21 DIAGNOSIS — E8881 Metabolic syndrome: Secondary | ICD-10-CM | POA: Insufficient documentation

## 2022-03-21 DIAGNOSIS — E611 Iron deficiency: Secondary | ICD-10-CM | POA: Insufficient documentation

## 2022-03-21 DIAGNOSIS — D751 Secondary polycythemia: Secondary | ICD-10-CM | POA: Insufficient documentation

## 2022-03-21 DIAGNOSIS — R5383 Other fatigue: Secondary | ICD-10-CM

## 2022-03-21 DIAGNOSIS — R6882 Decreased libido: Secondary | ICD-10-CM

## 2022-03-21 DIAGNOSIS — E782 Mixed hyperlipidemia: Secondary | ICD-10-CM | POA: Insufficient documentation

## 2022-03-21 DIAGNOSIS — E88819 Insulin resistance, unspecified: Secondary | ICD-10-CM | POA: Insufficient documentation

## 2022-03-21 HISTORY — DX: Testicular hypofunction: E29.1

## 2022-03-21 HISTORY — DX: Iron deficiency: E61.1

## 2022-03-21 HISTORY — DX: Secondary polycythemia: D75.1

## 2022-03-21 HISTORY — DX: Decreased libido: R68.82

## 2022-03-21 HISTORY — DX: Insulin resistance, unspecified: E88.819

## 2022-03-21 HISTORY — DX: Body mass index (BMI) 29.0-29.9, adult: Z68.29

## 2022-03-21 HISTORY — DX: Mixed hyperlipidemia: E78.2

## 2022-03-21 HISTORY — DX: Other fatigue: R53.83

## 2022-03-21 HISTORY — DX: Other specified abnormal findings of blood chemistry: R79.89

## 2022-03-21 NOTE — Progress Notes (Unsigned)
Cardiology Office Note:    Date:  03/21/2022   ID:  Gregory Fowler, DOB 1975-09-06, MRN [id number]  PCP:  [name], NP  Cardiologist:  Norman Herrlich, MD   Referring MD: [name], NP  ASSESSMENT:    No diagnosis found. PLAN:    In order of problems listed above:  ***  Next appointment   Medication Adjustments/Labs and Tests Ordered: Current medicines are reviewed at length with the patient today.  Concerns regarding medicines are outlined above.  No orders of the defined types were placed in this encounter.  No orders of the defined types were placed in this encounter.    No chief complaint on file. ***  History of Present Illness:    Gregory Fowler is a 46 y.o. male who is being seen today for the evaluation of *** at the request of [name], NP.   Past Medical History:  Diagnosis Date   Abnormal LFTs 03/03/2011   Body mass index (BMI) 29.0-29.9, adult 03/21/2022   Chronic nonintractable headache 02/23/2022   Family history of early CAD 01/26/2022   Fatigue 03/21/2022   Fatty liver    Gastroparesis    GERD (gastroesophageal reflux disease)    Hiatal hernia    HTN (hypertension) 01/23/2022   Hyperlipidemia 02/23/2022   Hypogonadism in male 02/23/2022   Insulin resistance 03/21/2022   Iron deficiency 03/21/2022   Joint pain 03/21/2022   Kidney stone    Low libido 03/21/2022   Low testosterone 03/21/2022   Mixed hyperlipidemia 03/21/2022   NAFLD (nonalcoholic fatty liver disease) 6/94/5038   Nonspecific elevation of levels of transaminase or lactic acid dehydrogenase (LDH)    Overweight with body mass index (BMI) of 29 to 29.9 in adult 02/23/2022   Peri-rectal abscess 07/28/2019   Perirectal abscess 02/24/2022   Preventative health care 09/08/2014   Secondary polycythemia 03/21/2022   Testicular hypofunction 03/21/2022   Tobacco abuse 07/20/2010   Qualifier: Diagnosis of  By: Rodena Medin MD, Acie Fredrickson     Past Surgical History:  Procedure  Laterality Date   INCISION AND DRAINAGE ABSCESS N/A 02/25/2022   Procedure: INCISION AND DRAINAGE PERI-RECTAL ABSCESS;  Surgeon: Abigail Miyamoto, MD;  Location: [facility];  Service: General;  Laterality: N/A;   INCISION AND DRAINAGE PERIRECTAL ABSCESS N/A 04/18/2019   Procedure: IRRIGATION AND DEBRIDEMENT PERIRECTAL ABSCESS EXAM UNDER ANESTHESIA;  Surgeon: Darnell Level, MD;  Location: [facility];  Service: General;  Laterality: N/A;   INCISION AND DRAINAGE PERIRECTAL ABSCESS N/A 07/29/2019   Procedure: IRRIGATION AND DEBRIDEMENT PERIRECTAL ABSCESS;  Surgeon: Gaynelle Adu, MD;  Location: [facility];  Service: General;  Laterality: N/A;   lasix eye surgery Bilateral    TONSILLECTOMY     UPPER GASTROINTESTINAL ENDOSCOPY  2012   Dr.Patterson    Current Medications: No outpatient medications have been marked as taking for the 03/22/22 encounter (Appointment) with Baldo Daub, MD.     Allergies:   [name].   Social History   Socioeconomic History   Marital status: Married    Spouse name: Not on file   Number of children: 2   Years of education: Not on file   Highest education level: Not on file  Occupational History   Occupation: Film/video editor: [employer]  Tobacco Use   Smoking status: Every Day    Packs/day: 1.00    Years: 30.00    Total pack years: 30.00    Types: Cigarettes  Start date: 04/13/2017   Smokeless tobacco: Former    Types: Associate Professor Use: Never used  Substance and Sexual Activity   Alcohol use: Yes    Comment: occasional   Drug use: No   Sexual activity: Yes    Partners: Female  Other Topics Concern   Not on file  Social History Narrative   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   Daughter-2013   Son- 2009   Married   Enjoys playing golf   Completed 12th grade   No DWI.    Social Determinants of Health   Financial Resource Strain: Not on file  Food Insecurity: Not on  file  Transportation Needs: Not on file  Physical Activity: Not on file  Stress: Not on file  Social Connections: Not on file     Family History: The patient's ***family history includes CAD in his father, paternal grandfather, paternal great-grandfather, paternal uncle, and paternal uncle; Liver disease in his mother. There is no history of Colon cancer, Colon polyps, Diabetes, Kidney disease, Gallbladder disease, or Esophageal cancer.  ROS:   ROS Please see the history of present illness.    *** All other systems reviewed and are negative.  EKGs/Labs/Other Studies Reviewed:    The following studies were reviewed today: ***  EKG:  EKG is *** ordered today.  The ekg ordered today is personally reviewed and demonstrates ***  Recent Labs: 02/24/2022: ALT 28 02/25/2022: BUN 13; Creatinine, Ser 1.12; Hemoglobin 13.8; Platelets 197; Potassium 3.7; Sodium 135  Recent Lipid Panel    Component Value Date/Time   CHOL 205 (H) 05/23/2021 1204   TRIG 296.0 (H) 05/23/2021 1204   HDL 33.50 (L) 05/23/2021 1204   CHOLHDL 6 05/23/2021 1204   VLDL 59.2 (H) 05/23/2021 1204   LDLCALC 139 (H) 04/27/2020 1110   LDLDIRECT 146.0 05/23/2021 1204    Physical Exam:    VS:  There were no vitals taken for this visit.    Wt Readings from Last 3 Encounters:  02/24/22 220 lb (99.8 kg)  02/23/22 230 lb (104.3 kg)  02/02/22 220 lb (99.8 kg)     GEN: *** Well nourished, well developed in no acute distress HEENT: Normal NECK: No JVD; No carotid bruits LYMPHATICS: No lymphadenopathy CARDIAC: ***RRR, no murmurs, rubs, gallops RESPIRATORY:  Clear to auscultation without rales, wheezing or rhonchi  ABDOMEN: Soft, non-tender, non-distended MUSCULOSKELETAL:  No edema; No deformity  SKIN: Warm and dry NEUROLOGIC:  Alert and oriented x 3 PSYCHIATRIC:  Normal affect     Signed, Norman Herrlich, MD  03/21/2022 5:22 PM    [facility]

## 2022-03-22 ENCOUNTER — Encounter: Payer: Self-pay | Admitting: Cardiology

## 2022-03-22 ENCOUNTER — Ambulatory Visit (INDEPENDENT_AMBULATORY_CARE_PROVIDER_SITE_OTHER): Payer: Managed Care, Other (non HMO) | Admitting: Cardiology

## 2022-03-22 VITALS — BP 110/62 | HR 92 | Ht 74.0 in | Wt 229.0 lb

## 2022-03-22 DIAGNOSIS — Z7189 Other specified counseling: Secondary | ICD-10-CM | POA: Diagnosis not present

## 2022-03-22 DIAGNOSIS — I700 Atherosclerosis of aorta: Secondary | ICD-10-CM

## 2022-03-22 DIAGNOSIS — I10 Essential (primary) hypertension: Secondary | ICD-10-CM

## 2022-03-22 DIAGNOSIS — R072 Precordial pain: Secondary | ICD-10-CM

## 2022-03-22 DIAGNOSIS — E782 Mixed hyperlipidemia: Secondary | ICD-10-CM

## 2022-03-22 DIAGNOSIS — R9431 Abnormal electrocardiogram [ECG] [EKG]: Secondary | ICD-10-CM

## 2022-03-22 MED ORDER — ROSUVASTATIN CALCIUM 10 MG PO TABS: 10.0000 mg | ORAL_TABLET | Freq: Every day | ORAL | 3 refills | Status: DC

## 2022-03-22 MED ORDER — METOPROLOL TARTRATE 100 MG PO TABS: 100.0000 mg | ORAL_TABLET | Freq: Once | ORAL | 0 refills | Status: DC

## 2022-03-22 NOTE — Patient Instructions (Signed)
Medication Instructions:  Your physician has recommended you make the following change in your medication:   START: Rosuvastatin 10 mg daily  *If you need a refill on your cardiac medications before your next appointment, please call your pharmacy*   Lab Work: Your physician recommends that you return for lab work in:   Labs 1 week before CT: BMP  If you have labs (blood work) drawn today and your tests are completely normal, you will receive your results only by: MyChart Message (if you have MyChart) OR A paper copy in the mail If you have any lab test that is abnormal or we need to change your treatment, we will call you to review the results.   Testing/Procedures:   Your cardiac CT will be scheduled at one of the below locations:   [facility] [address] [place], [place] [address] [phone]  OR  [facility] [address] [place], [place] [address] [phone]  If scheduled at [facility], please arrive at the [facility] and Children's Entrance (Entrance C2) of [facility] 30 minutes prior to test start time. You can use the FREE valet parking offered at entrance C (encouraged to control the heart rate for the test)  Proceed to the [facility] Radiology Department (first floor) to check-in and test prep.  All radiology patients and guests should use entrance C2 at [facility], accessed from [facility], even though the hospital's physical address listed is [address]    If scheduled at [facility], please arrive 15 mins early for check-in and test prep.  Please follow these instructions carefully (unless otherwise directed):  Hold all erectile dysfunction medications at least 3 days (72 hrs) prior to test.  On the Night Before the Test: Be sure to Drink plenty of water. Do not consume any caffeinated/decaffeinated beverages or  chocolate 12 hours prior to your test. Do not take any antihistamines 12 hours prior to your test.  On the Day of the Test: Drink plenty of water until 1 hour prior to the test. Do not eat any food 4 hours prior to the test. You may take your regular medications prior to the test.  Take metoprolol (Lopressor) two hours prior to test.      After the Test: Drink plenty of water. After receiving IV contrast, you may experience a mild flushed feeling. This is normal. On occasion, you may experience a mild rash up to 24 hours after the test. This is not dangerous. If this occurs, you can take Benadryl 25 mg and increase your fluid intake. If you experience trouble breathing, this can be serious. If it is severe call 911 IMMEDIATELY. If it is mild, please call our office.  We will call to schedule your test 2-4 weeks out understanding that some insurance companies will need an authorization prior to the service being performed.   For non-scheduling related questions, please contact the cardiac imaging nurse navigator should you have any questions/concerns: Rockwell Alexandria, Cardiac Imaging Nurse Navigator Larey Brick, Cardiac Imaging Nurse Navigator [facility] Direct Office Dial: [phone]   For scheduling needs, including cancellations and rescheduling, please call [place], [phone].    Follow-Up: At [facility], you and your health needs are our priority.  As part of our continuing mission to provide you with exceptional heart care, we have created designated Provider Care Teams.  These Care Teams include your  primary Cardiologist (physician) and Advanced Practice Providers (APPs -  Physician Assistants and Nurse Practitioners) who all work together to provide you with the care you need, when you need it.  We recommend signing up for the patient portal called "MyChart".  Sign up information is provided on this After Visit Summary.  MyChart is used to  connect with patients for Virtual Visits (Telemedicine).  Patients are able to view lab/test results, encounter notes, upcoming appointments, etc.  Non-urgent messages can be sent to your provider as well.   To learn more about what you can do with MyChart, go to [email or web address].    Your next appointment:   3 month(s)  The format for your next appointment:   In Person  Provider:   Norman Herrlich, MD    Other Instructions None  Important Information About Sugar

## 2022-03-31 LAB — BASIC METABOLIC PANEL
BUN/Creatinine Ratio: 11 (ref 9–20)
BUN: 12 mg/dL (ref 6–24)
CO2: 21 mmol/L (ref 20–29)
Calcium: 9.9 mg/dL (ref 8.7–10.2)
Chloride: 101 mmol/L (ref 96–106)
Creatinine, Ser: 1.06 mg/dL (ref 0.76–1.27)
Glucose: 85 mg/dL (ref 70–99)
Potassium: 4.4 mmol/L (ref 3.5–5.2)
Sodium: 138 mmol/L (ref 134–144)
eGFR: 88 mL/min/{1.73_m2} (ref >59)

## 2022-04-10 ENCOUNTER — Telehealth (HOSPITAL_COMMUNITY): Payer: Self-pay | Admitting: Emergency Medicine

## 2022-04-10 NOTE — Telephone Encounter (Signed)
Reaching out to patient to offer assistance regarding upcoming cardiac imaging study; pt verbalizes understanding of appt date/time, parking situation and where to check in, pre-test NPO status and medications ordered, and verified current allergies; name and call back number provided for further questions should they arise Rockwell Alexandria RN Navigator Cardiac Imaging [facility] [phone] office [phone] cell  Arrival 1230 w/c entrnace Denies iv issues Aware nitro PO hydration to support BP 100mg  metoprolol tartrate

## 2022-04-11 ENCOUNTER — Ambulatory Visit (HOSPITAL_COMMUNITY)
Admission: RE | Admit: 2022-04-11 | Discharge: 2022-04-11 | Disposition: A | Discharge status: Home / Self Care | Payer: Managed Care, Other (non HMO) | Source: Ambulatory Visit | Attending: Cardiology | Admitting: Cardiology

## 2022-04-11 DIAGNOSIS — R072 Precordial pain: Secondary | ICD-10-CM | POA: Diagnosis present

## 2022-04-11 MED ORDER — NITROGLYCERIN 0.4 MG SL SUBL
SUBLINGUAL_TABLET | SUBLINGUAL | Status: AC
  Filled 2022-04-11: qty 2

## 2022-04-11 MED ORDER — NITROGLYCERIN 0.4 MG SL SUBL
0.8000 mg | SUBLINGUAL_TABLET | Freq: Once | SUBLINGUAL | Status: AC
  Administered 2022-04-11: 0.8 mg via SUBLINGUAL

## 2022-04-11 MED ORDER — IOHEXOL 350 MG/ML SOLN
100.0000 mL | Freq: Once | INTRAVENOUS | Status: AC | PRN
  Administered 2022-04-11: 100 mL via INTRAVENOUS

## 2022-04-12 ENCOUNTER — Telehealth: Payer: Self-pay | Admitting: Cardiology

## 2022-04-12 NOTE — Telephone Encounter (Signed)
Patient informed of results.  

## 2022-04-12 NOTE — Telephone Encounter (Signed)
Returning call for results. Please advise  

## 2022-04-19 ENCOUNTER — Other Ambulatory Visit: Payer: Self-pay | Admitting: Family

## 2022-04-27 ENCOUNTER — Ambulatory Visit (INDEPENDENT_AMBULATORY_CARE_PROVIDER_SITE_OTHER): Payer: Managed Care, Other (non HMO) | Admitting: Family

## 2022-04-27 VITALS — BP 120/87 | HR 90 | Temp 98.1°F | Resp 16 | Wt 225.0 lb

## 2022-04-27 DIAGNOSIS — K611 Rectal abscess: Secondary | ICD-10-CM

## 2022-04-27 DIAGNOSIS — G43909 Migraine, unspecified, not intractable, without status migrainosus: Secondary | ICD-10-CM | POA: Insufficient documentation

## 2022-04-27 HISTORY — DX: Migraine, unspecified, not intractable, without status migrainosus: G43.909

## 2022-04-27 MED ORDER — SUMATRIPTAN SUCCINATE 50 MG PO TABS: 50.0000 mg | ORAL_TABLET | ORAL | 1 refills | Status: DC | PRN

## 2022-04-27 MED ORDER — KETOROLAC TROMETHAMINE 60 MG/2ML IM SOLN
60.0000 mg | Freq: Once | INTRAMUSCULAR | Status: AC
  Administered 2022-04-27: 60 mg via INTRAMUSCULAR

## 2022-04-27 NOTE — Addendum Note (Signed)
Addended by: Wilford Corner on: 04/27/2022 11:07 AM   Modules accepted: Orders

## 2022-04-27 NOTE — Assessment & Plan Note (Signed)
Clinically stable following surgery. Continue to monitor.

## 2022-04-27 NOTE — Assessment & Plan Note (Signed)
No significant improvement with ibuprofen.  Recommended trial of prn Imitrex.  Toradol 60 mg IM here today.  Refer to neurology for ongoing management.

## 2022-04-27 NOTE — Progress Notes (Signed)
Subjective:   By signing my name below, I, Gregory Fowler, attest that this documentation has been prepared under the direction and in the presence of Gregory Chimera, NP 04/27/2022    Patient ID: Gregory Fowler, male    DOB: [birth year], 46 y.o.   MRN: [id number]  Chief Complaint  Patient presents with   Headache    Complains of headaches for about 2 months, getting worse.     HPI Patient is in today for an office visit  Headaches: He complains of headache that has been persistent for the last few days. He takes OTC allergy medications and Ibuprofen but symptoms are persistent. He used to experience cluster headaches. Typically he has pain behind the right eye "to the point that I can't open my eye." He often has associated nausea but no vomiting. + photophobia.    Cardiologist: Pt had a recent cardiac CT with a score of 0.   Perirectal abscess- had I and D performed 02/26/22. Reports doing well post-operatively.   Health Maintenance Due  Topic Date Due   COVID-19 Vaccine (1) Never done   INFLUENZA VACCINE  03/13/2022    Past Medical History:  Diagnosis Date   Abnormal LFTs 03/03/2011   Body mass index (BMI) 29.0-29.9, adult 03/21/2022   Chronic nonintractable headache 02/23/2022   Family history of early CAD 01/26/2022   Fatigue 03/21/2022   Fatty liver    Gastroparesis    GERD (gastroesophageal reflux disease)    Hiatal hernia    HTN (hypertension) 01/23/2022   Hyperlipidemia 02/23/2022   Hypogonadism in male 02/23/2022   Insulin resistance 03/21/2022   Iron deficiency 03/21/2022   Joint pain 03/21/2022   Kidney stone    Low libido 03/21/2022   Low testosterone 03/21/2022   Mixed hyperlipidemia 03/21/2022   NAFLD (nonalcoholic fatty liver disease) 05/01/2011   Nonspecific elevation of levels of transaminase or lactic acid dehydrogenase (LDH)    Overweight with body mass index (BMI) of 29 to 29.9 in adult 02/23/2022   Peri-rectal abscess 07/28/2019   Perirectal abscess  02/24/2022   Preventative health care 09/08/2014   Secondary polycythemia 03/21/2022   Testicular hypofunction 03/21/2022   Tobacco abuse 07/20/2010   Qualifier: Diagnosis of  By: Elizebeth Koller MD, Mora Appl     Past Surgical History:  Procedure Laterality Date   INCISION AND DRAINAGE ABSCESS N/A 02/25/2022   Procedure: INCISION AND DRAINAGE PERI-RECTAL ABSCESS;  Surgeon: Coralie Keens, MD;  Location: [facility];  Service: General;  Laterality: N/A;   INCISION AND DRAINAGE PERIRECTAL ABSCESS N/A 04/18/2019   Procedure: IRRIGATION AND DEBRIDEMENT PERIRECTAL ABSCESS EXAM UNDER ANESTHESIA;  Surgeon: Armandina Gemma, MD;  Location: [facility];  Service: General;  Laterality: N/A;   INCISION AND DRAINAGE PERIRECTAL ABSCESS N/A 07/29/2019   Procedure: IRRIGATION AND DEBRIDEMENT PERIRECTAL ABSCESS;  Surgeon: [name], MD;  Location: [facility];  Service: General;  Laterality: N/A;   lasix eye surgery Bilateral    TONSILLECTOMY     UPPER GASTROINTESTINAL ENDOSCOPY  2012   Dr.Patterson    Family History  Problem Relation Age of Onset   Liver disease Mother        fatty liver- she is overweight   CAD Father    CAD Paternal Grandfather        bypass   CAD Paternal Uncle        died from MI   CAD Paternal Uncle        died from MI   CAD Paternal  Great-grandfather    Colon cancer Neg Hx    Colon polyps Neg Hx    Diabetes Neg Hx    Kidney disease Neg Hx    Gallbladder disease Neg Hx    Esophageal cancer Neg Hx     Social History   Socioeconomic History   Marital status: Married    Spouse name: Not on file   Number of children: 2   Years of education: Not on file   Highest education level: Not on file  Occupational History   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   Daughter-2013   Son- 2009   Married   Enjoys playing golf   Completed 12th grade   No DWI.    Social Determinants of Health   Financial Resource Strain: Not on file  Food Insecurity: Not on file  Transportation Needs: Not on file  Physical Activity: Not on file  Stress: Not on file  Social Connections: Not on file  Intimate Partner Violence: Not on file    Outpatient Medications Prior to Visit  Medication Sig Dispense Refill   amLODipine (NORVASC) 2.5 MG tablet TAKE 1 TABLET BY MOUTH DAILY 90 tablet 0   anastrozole (ARIMIDEX) 1 MG tablet Take 0.5 mg by mouth See admin instructions. Take 0.5 mg by mouth every other Thursday     Coenzyme Q10 (CO Q10) 200 MG CAPS Take 200 mg by mouth daily.     doxycycline (VIBRAMYCIN) 100 MG capsule Take 100 mg by mouth 2 (two) times daily.     fluticasone (FLONASE) 50 MCG/ACT nasal spray Place 1-2 sprays into both nostrils daily.     ibuprofen (ADVIL) 600 MG tablet Take 1 tablet (600 mg total) by mouth every 6 (six) hours as needed for moderate pain. Take with food 30 tablet 0   MELATONIN GUMMIES PO Take 2 tablets by mouth at bedtime.     milk thistle 175 MG tablet Take 175 mg by mouth daily.     multivitamin (ONE-A-DAY MEN'S) TABS tablet Take 1 tablet by mouth daily with breakfast.     Omega-3 Fatty Acids (FISH OIL PO) Take 1 capsule by mouth daily.     pantoprazole (PROTONIX) 40 MG tablet TAKE ONE TABLET BY MOUTH DAILY 90 tablet 1   Probiotic Product (PROBIOTIC DAILY) CAPS Take 1 capsule by mouth daily.     rosuvastatin (CRESTOR) 10 MG tablet Take 1 tablet (10 mg total) by mouth daily. 90 tablet 3   testosterone cypionate (DEPOTESTOSTERONE CYPIONATE) 200 MG/ML  injection Inject 240 mg into the muscle every Thursday.     docusate sodium (COLACE) 100 MG capsule Take 1 capsule (100 mg total) by mouth 2 (two) times daily as needed for mild constipation. 30 capsule 2   oxyCODONE (OXY IR/ROXICODONE) 5 MG immediate release tablet Take 1 tablet (  5 mg total) by mouth every 6 (six) hours as needed for moderate pain or severe pain. 30 tablet 0   Red Yeast Rice Extract (RED YEAST RICE PO) Take 2 capsules by mouth daily.     metoprolol tartrate (LOPRESSOR) 100 MG tablet Take 1 tablet (100 mg total) by mouth once for 1 dose. Please take 2 hours before CT 1 tablet 0   No facility-administered medications prior to visit.    No Known Allergies  Review of Systems  Neurological:  Positive for headaches.       Objective:    Physical Exam Constitutional:      General: He is not in acute distress.    Appearance: Normal appearance. He is not ill-appearing.  HENT:     Head: Normocephalic and atraumatic.     Right Ear: External ear normal.     Left Ear: External ear normal.  Eyes:     Extraocular Movements: Extraocular movements intact.     Pupils: Pupils are equal, round, and reactive to light.  Cardiovascular:     Rate and Rhythm: Normal rate and regular rhythm.     Heart sounds: Normal heart sounds. No murmur heard.    No gallop.  Pulmonary:     Effort: Pulmonary effort is normal. No respiratory distress.     Breath sounds: Normal breath sounds. No wheezing or rales.  Musculoskeletal:     Comments:  5/5 strength upper and lower extremeties  Skin:    General: Skin is warm and dry.  Neurological:     Mental Status: He is alert and oriented to person, place, and time.  Psychiatric:        Mood and Affect: Mood normal.        Behavior: Behavior normal.        Judgment: Judgment normal.     BP 120/87 (BP Location: Right Arm, Patient Position: Sitting, Cuff Size: Large)   Pulse 90   Temp 98.1 F (36.7 C) (Oral)   Resp 16   Wt 225 lb (102.1 kg)    SpO2 99%   BMI 28.89 kg/m  Wt Readings from Last 3 Encounters:  04/27/22 225 lb (102.1 kg)  03/22/22 229 lb (103.9 kg)  02/24/22 220 lb (99.8 kg)       Assessment & Plan:   Problem List Items Addressed This Visit       Unprioritized   Perirectal abscess    Clinically stable following surgery. Continue to monitor.       Migraine without status migrainosus, not intractable - Primary    No significant improvement with ibuprofen.  Recommended trial of prn Imitrex.  Toradol 60 mg IM here today.  Refer to neurology for ongoing management.      Relevant Medications   SUMAtriptan (IMITREX) 50 MG tablet   Other Relevant Orders   Ambulatory referral to Neurology   Meds ordered this encounter  Medications   SUMAtriptan (IMITREX) 50 MG tablet    Sig: Take 1 tablet (50 mg total) by mouth every 2 (two) hours as needed for migraine. May repeat in 2 hours if headache persists or recurs.    Dispense:  10 tablet    Refill:  1    Order Specific Question:   Supervising Provider    Answer:   Danise Edge A [[id number]]    I, Lemont Fillers, NP, personally preformed the services described in this documentation.  All medical record entries made by the scribe were at my direction and  in my presence.  I have reviewed the chart and discharge instructions (if applicable) and agree that the record reflects my personal performance and is accurate and complete. 04/27/2022   I,Amber Collins,acting as a scribe for Nance Pear, NP.,have documented all relevant documentation on the behalf of Nance Pear, NP,as directed by  Nance Pear, NP while in the presence of Nance Pear, NP.    Nance Pear, NP

## 2022-05-25 ENCOUNTER — Encounter: Payer: Managed Care, Other (non HMO) | Admitting: Family

## 2022-05-28 ENCOUNTER — Ambulatory Visit: Payer: Managed Care, Other (non HMO) | Admitting: Psychiatry

## 2022-06-12 NOTE — Progress Notes (Unsigned)
Referring:  [name], NP [address] [place],  [place] [address]  PCP: [name], NP  Neurology was asked to evaluate Dagen Beevers, a 46 year old male for a chief complaint of headaches.  Our recommendations of care will be communicated by shared medical record.    CC:  headaches  History provided from self  HPI:  Medical co-morbidities: kidney stones, HTN, HLD  The patient presents for evaluation of headaches which began 20 years ago. 5-6 months out of the year he will have constant daily headaches for 1-2 months, then they will resolve. Headaches are associated with photophobia, phonophobia, and nausea. He denies ipsilateral autonomic symptoms. Sometimes will have sharp pain radiating from his occiput to his right eye as well.  Had a bad headache cycle last month, but he has been headache free for the past few weeks. Was prescribed Imitrex for rescue which made him nauseated.  Headache History: Onset: 20 years ago Triggers: heat, sun Aura: blurred vision Location: right retro-orbital Quality/Description: sharp Associated Symptoms:  Photophobia: yes  Phonophobia: yes  Nausea: yes Other symptoms: dizziness Worse with activity?: yes Duration of headaches: yes  Headache days per month: 7 Headache free days per month: 23  Current Treatment: Abortive Imitrex 50 PRN ibuprofen  Preventative none  Prior Therapies                                 Ibuprofen - helps sometimes Excedrin Imitrex 50 mg PRN - nausea Amlodipine 2.5 mg daily Topamax contraindicated due to kidney stones  LABS: CBC    Component Value Date/Time   WBC 13.2 (H) 02/25/2022 0317   RBC 4.96 02/25/2022 0317   HGB 13.8 02/25/2022 0317   HCT 41.3 02/25/2022 0317   PLT 197 02/25/2022 0317   MCV 83.3 02/25/2022 0317   MCH 27.8 02/25/2022 0317   MCHC 33.4 02/25/2022 0317   RDW 14.2 02/25/2022 0317   LYMPHSABS 1.9 02/24/2022 1542   MONOABS 1.6 (H) 02/24/2022  1542   EOSABS 0.1 02/24/2022 1542   BASOSABS 0.1 02/24/2022 1542      Latest Ref Rng & Units 03/30/2022   11:24 AM 02/25/2022    3:17 AM 02/24/2022    3:42 PM  CMP  Glucose 70 - 99 mg/dL 85  834  80   BUN 6 - 24 mg/dL 12  13  14    Creatinine 0.76 - 1.27 mg/dL  1.96  2.22   Sodium 134 - 144 mmol/L 138  135  137   Potassium 3.5 - 5.2 mmol/L 4.4  3.7  3.9   Chloride 96 - 106 mmol/L 101  104  106   CO2 20 - 29 mmol/L 21  24  23    Calcium 8.7 - 10.2 mg/dL 9.9  8.3  9.4   Total Protein 6.5 - 8.1 g/dL   7.7   Total Bilirubin 0.3 - 1.2 mg/dL   0.6   Alkaline Phos 38 - 126 U/L   58   AST 15 - 41 U/L   21   ALT 0 - 44 U/L   28      IMAGING:  CTH 2006 showed chronic sinusitis and was otherwise unremarkable   Current Outpatient Medications on File Prior to Visit  Medication Sig Dispense Refill   amLODipine (NORVASC) 2.5 MG tablet TAKE 1 TABLET BY MOUTH DAILY 90 tablet 0   anastrozole (ARIMIDEX) 1 MG  tablet Take 0.5 mg by mouth See admin instructions. Take 0.5 mg by mouth every other Thursday     Coenzyme Q10 (CO Q10) 200 MG CAPS Take 200 mg by mouth daily.     doxycycline (VIBRAMYCIN) 100 MG capsule Take 100 mg by mouth 2 (two) times daily.     fluticasone (FLONASE) 50 MCG/ACT nasal spray Place 1-2 sprays into both nostrils daily.     ibuprofen (ADVIL) 600 MG tablet Take 1 tablet (600 mg total) by mouth every 6 (six) hours as needed for moderate pain. Take with food 30 tablet 0   MELATONIN GUMMIES PO Take 2 tablets by mouth at bedtime.     milk thistle 175 MG tablet Take 175 mg by mouth daily.     multivitamin (ONE-A-DAY MEN'S) TABS tablet Take 1 tablet by mouth daily with breakfast.     Omega-3 Fatty Acids (FISH OIL PO) Take 1 capsule by mouth daily.     pantoprazole (PROTONIX) 40 MG tablet TAKE ONE TABLET BY MOUTH DAILY 90 tablet 1   Probiotic Product (PROBIOTIC DAILY) CAPS Take 1 capsule by mouth daily.     rosuvastatin (CRESTOR) 10 MG tablet Take 1 tablet (10 mg total) by mouth  daily. 90 tablet 3   testosterone cypionate (DEPOTESTOSTERONE CYPIONATE) 200 MG/ML injection Inject 240 mg into the muscle every Thursday.     No current facility-administered medications on file prior to visit.     Allergies: No Known Allergies  Family History: Migraine or other headaches in the family:  no Aneurysms in a first degree relative:  no Brain tumors in the family:  no Other neurological illness in the family:   no  Past Medical History: Past Medical History:  Diagnosis Date   Abnormal LFTs 03/03/2011   Body mass index (BMI) 29.0-29.9, adult 03/21/2022   Chronic nonintractable headache 02/23/2022   Family history of early CAD 01/26/2022   Fatigue 03/21/2022   Fatty liver    Gastroparesis    GERD (gastroesophageal reflux disease)    Hiatal hernia    HTN (hypertension) 01/23/2022   Hyperlipidemia 02/23/2022   Hypogonadism in male 02/23/2022   Insulin resistance 03/21/2022   Iron deficiency 03/21/2022   Joint pain 03/21/2022   Kidney stone    Low libido 03/21/2022   Low testosterone 03/21/2022   Mixed hyperlipidemia 03/21/2022   NAFLD (nonalcoholic fatty liver disease) 9/79/8921   Nonspecific elevation of levels of transaminase or lactic acid dehydrogenase (LDH)    Overweight with body mass index (BMI) of 29 to 29.9 in adult 02/23/2022   Peri-rectal abscess 07/28/2019   Perirectal abscess 02/24/2022   Preventative health care 09/08/2014   Secondary polycythemia 03/21/2022   Testicular hypofunction 03/21/2022   Tobacco abuse 07/20/2010   Qualifier: Diagnosis of  By: Rodena Medin MD, Acie Fredrickson     Past Surgical History Past Surgical History:  Procedure Laterality Date   INCISION AND DRAINAGE ABSCESS N/A 02/25/2022   Procedure: INCISION AND DRAINAGE PERI-RECTAL ABSCESS;  Surgeon: Abigail Miyamoto, MD;  Location: [facility];  Service: General;  Laterality: N/A;   INCISION AND DRAINAGE PERIRECTAL ABSCESS N/A 04/18/2019   Procedure: IRRIGATION AND DEBRIDEMENT PERIRECTAL ABSCESS EXAM UNDER  ANESTHESIA;  Surgeon: Darnell Level, MD;  Location: [facility];  Service: General;  Laterality: N/A;   INCISION AND DRAINAGE PERIRECTAL ABSCESS N/A 07/29/2019   Procedure: IRRIGATION AND DEBRIDEMENT PERIRECTAL ABSCESS;  Surgeon: Gaynelle Adu, MD;  Location: [facility];  Service: General;  Laterality: N/A;   lasix eye surgery Bilateral  TONSILLECTOMY     UPPER GASTROINTESTINAL ENDOSCOPY  2012   Dr.Patterson    Social History: Social History   Tobacco Use   Smoking status: Every Day    Packs/day: 1.00    Years: 30.00    Total pack years: 30.00    Types: Cigarettes    Start date: 04/13/2017   Smokeless tobacco: Former    Types: Nurse, children's Use: Never used  Substance Use Topics   Alcohol use: Yes    Comment: occasional   Drug use: No    ROS: Negative for fevers, chills. Positive for headaches. All other systems reviewed and negative unless stated otherwise in HPI.   Physical Exam:   Vital Signs: BP (!) 121/91   Pulse 90   Ht 6\' 2"  (1.88 m)   Wt 230 lb 2 oz (104.4 kg)   BMI 29.55 kg/m  GENERAL: well appearing,in no acute distress,alert SKIN:  Color, texture, turgor normal. No rashes or lesions HEAD:  Normocephalic/atraumatic. CV:  RRR RESP: Normal respiratory effort MSK: no tenderness to palpation over occiput, neck, or shoulders  NEUROLOGICAL: Mental Status: Alert, oriented to person, place and time,Follows commands Cranial Nerves: PERRL, visual fields intact to confrontation, extraocular movements intact, facial sensation intact, no facial droop or ptosis, hearing grossly intact, no dysarthria Motor: muscle strength 5/5 both upper and lower extremities,no drift, normal tone Reflexes: 2+ throughout Sensation: intact to light touch all 4 extremities Coordination: Finger-to- nose-finger intact bilaterally Gait: normal-based   IMPRESSION: 46 year old male with a history of kidney stones, HTN, HLD who presents for evaluation of headaches. Neurological exam  today is normal. His headache pattern is most consistent with migraine without aura. Cluster headaches are less likely given lack of ipsilateral autonomic symptoms and prolonged headache duration (constant for 1-2 months). He is currently headache free. Imitrex made him nauseated, will switch to Maxalt for rescue. Would consider a preventive medication if daily headaches reoccur.  PLAN: -Rescue: Start Maxalt 10 mg PRN -Next steps: consider propranolol, nortriptyline for headache prevention   I spent a total of 27 minutes chart reviewing and counseling the patient. Headache education was done. Discussed treatment options including preventive and acute medications. Discussed medication side effects, adverse reactions and drug interactions. Written educational materials and patient instructions outlining all of the above were given.  Follow-up: as needed if headaches return   Genia Harold, MD 06/13/2022   1:12 PM

## 2022-06-13 ENCOUNTER — Ambulatory Visit (INDEPENDENT_AMBULATORY_CARE_PROVIDER_SITE_OTHER): Payer: Managed Care, Other (non HMO) | Admitting: Psychiatry

## 2022-06-13 VITALS — BP 121/91 | HR 90 | Ht 74.0 in | Wt 230.1 lb

## 2022-06-13 DIAGNOSIS — G43009 Migraine without aura, not intractable, without status migrainosus: Secondary | ICD-10-CM

## 2022-06-13 MED ORDER — RIZATRIPTAN BENZOATE 10 MG PO TABS: 10.0000 mg | ORAL_TABLET | ORAL | 6 refills | Status: DC | PRN

## 2022-06-19 ENCOUNTER — Ambulatory Visit: Payer: Managed Care, Other (non HMO) | Admitting: Family

## 2022-06-19 ENCOUNTER — Encounter: Payer: Self-pay | Admitting: Family

## 2022-06-19 VITALS — BP 119/84 | HR 86 | Temp 98.0°F | Resp 16 | Ht 74.0 in | Wt 217.0 lb

## 2022-06-19 DIAGNOSIS — Z720 Tobacco use: Secondary | ICD-10-CM

## 2022-06-19 DIAGNOSIS — Z23 Encounter for immunization: Secondary | ICD-10-CM | POA: Diagnosis not present

## 2022-06-19 DIAGNOSIS — K611 Rectal abscess: Secondary | ICD-10-CM | POA: Diagnosis not present

## 2022-06-19 DIAGNOSIS — I10 Essential (primary) hypertension: Secondary | ICD-10-CM | POA: Diagnosis not present

## 2022-06-19 DIAGNOSIS — E782 Mixed hyperlipidemia: Secondary | ICD-10-CM

## 2022-06-19 DIAGNOSIS — Z0000 Encounter for general adult medical examination without abnormal findings: Secondary | ICD-10-CM

## 2022-06-19 DIAGNOSIS — D72829 Elevated white blood cell count, unspecified: Secondary | ICD-10-CM

## 2022-06-19 DIAGNOSIS — K219 Gastro-esophageal reflux disease without esophagitis: Secondary | ICD-10-CM

## 2022-06-19 LAB — LIPID PANEL
Cholesterol: 166 mg/dL (ref 0–200)
HDL: 36.7 mg/dL — ABNORMAL LOW (ref >39.00)
LDL Cholesterol: 94 mg/dL (ref 0–99)
NonHDL: 128.8
Total CHOL/HDL Ratio: 5
Triglycerides: 175 mg/dL — ABNORMAL HIGH (ref 0.0–149.0)
VLDL: 35 mg/dL (ref 0.0–40.0)

## 2022-06-19 LAB — CBC WITH DIFFERENTIAL/PLATELET
Basophils Absolute: 0.1 10*3/uL (ref 0.0–0.1)
Basophils Relative: 1 % (ref 0.0–3.0)
Eosinophils Absolute: 0.2 10*3/uL (ref 0.0–0.7)
Eosinophils Relative: 2.6 % (ref 0.0–5.0)
HCT: 45.6 % (ref 39.0–52.0)
Hemoglobin: 15.6 g/dL (ref 13.0–17.0)
Lymphocytes Relative: 33.2 % (ref 12.0–46.0)
Lymphs Abs: 2.5 10*3/uL (ref 0.7–4.0)
MCHC: 34.1 g/dL (ref 30.0–36.0)
MCV: 81.1 fl (ref 78.0–100.0)
Monocytes Absolute: 0.7 10*3/uL (ref 0.1–1.0)
Monocytes Relative: 9.3 % (ref 3.0–12.0)
Neutro Abs: 4.1 10*3/uL (ref 1.4–7.7)
Neutrophils Relative %: 53.9 % (ref 43.0–77.0)
Platelets: 194 10*3/uL (ref 150.0–400.0)
RBC: 5.63 Mil/uL (ref 4.22–5.81)
RDW: 14.5 % (ref 11.5–15.5)
WBC: 7.7 10*3/uL (ref 4.0–10.5)

## 2022-06-19 LAB — COMPREHENSIVE METABOLIC PANEL
ALT: 33 U/L (ref 0–53)
AST: 27 U/L (ref 0–37)
Albumin: 4.8 g/dL (ref 3.5–5.2)
Alkaline Phosphatase: 65 U/L (ref 39–117)
BUN: 13 mg/dL (ref 6–23)
CO2: 26 mEq/L (ref 19–32)
Calcium: 9.7 mg/dL (ref 8.4–10.5)
Chloride: 101 mEq/L (ref 96–112)
Creatinine, Ser: 0.99 mg/dL (ref 0.40–1.50)
GFR: 91.43 mL/min (ref >60.00)
Glucose, Bld: 76 mg/dL (ref 70–99)
Potassium: 3.8 mEq/L (ref 3.5–5.1)
Sodium: 137 mEq/L (ref 135–145)
Total Bilirubin: 0.6 mg/dL (ref 0.2–1.2)
Total Protein: 7.2 g/dL (ref 6.0–8.3)

## 2022-06-19 MED ORDER — VARENICLINE TARTRATE (STARTER) 0.5 MG X 11 & 1 MG X 42 PO TBPK: ORAL_TABLET | ORAL | 0 refills | Status: DC

## 2022-06-19 NOTE — Assessment & Plan Note (Signed)
Continues protonix.  Stable.

## 2022-06-19 NOTE — Assessment & Plan Note (Signed)
States he is taking crestor 10mg  QOD, sometimes daily. Check follow up lipid panel.

## 2022-06-19 NOTE — Progress Notes (Signed)
Subjective:   By signing my name below, I, Gregory Fowler, attest that this documentation has been prepared under the direction and in the presence of [name], NP 06/19/2022   Patient ID: Gregory Fowler, male    DOB: [birth year], 46 y.o.   MRN: [id number]  Chief Complaint  Patient presents with   Annual Exam    HPI Patient is in today for a comprehensive physical exam  Migraines: He was seen by Dr.Chima on 06/13/2022  for migraines w/out aura. He was prescribed 10 mg of Rizatriptan Benzoate as a result. He reports that his migraines have went away about a few weeks ago and therefore has not taken the medication.   Smoking: He is interested in quitting smoking. He has tried Chantix in the past and stated that it helped the first time. The second time, the medication was not as effective. He is currently smoking a pack a day.   Sleep: He reports that he does not sleep enough.   Abscess: He reports that symptoms are controlled.   Cholesterol: He takes one tablet of 10 mg of Crestor every other day. He occasionally takes the medication daily.  Lab Results  Component Value Date   CHOL 205 (H) 05/23/2021   HDL 33.50 (L) 05/23/2021   LDLCALC 139 (H) 04/27/2020   LDLDIRECT 146.0 05/23/2021   TRIG 296.0 (H) 05/23/2021   CHOLHDL 6 05/23/2021   Blood Pressure: He reports that he takes his 2.5 mg of Amlodipine daily.  BP Readings from Last 3 Encounters:  06/19/22 119/84  06/13/22 (!) 121/91  04/27/22 120/87   Pulse Readings from Last 3 Encounters:  06/19/22 86  06/13/22 90  04/27/22 90   Heartburn: He reports that his symptoms are controlled. He states that he occasionally has a flare up. He notes that he frequently burps during the evening. He consumes significant amounts of water and some sweet tea and coconut water.   He denies having any fever, hearing or vision symptoms, new muscle pain, joint pain , new moles, rashes, congestion, sinus pain, sore throat,  palpations, cough, SOB ,wheezing,n/v/d constipation, blood in stool, dysuria, frequency, hematuria, depression, anxiety, headaches at this time  Social history: He has had an incision and drainage abscess surgery on 02/25/2022. He denies of any changes to his family medical history. He reports that he consumes alcohol during social events but other than that, rarely.  Colonoscopy: Last completed on 02/02/2022 Immunizations: He is interested in receiving an influenza vaccine during today's visit. He is currently UTD on his tetanus vaccine.  Diet: His diet is improving.  Wt Readings from Last 3 Encounters:  06/19/22 217 lb (98.4 kg)  06/13/22 230 lb 2 oz (104.4 kg)  04/27/22 225 lb (102.1 kg)   Exercise: He is not exercising much due to his job.  Dental: He is UTD on dental exams  Vision: He is UTD on vision exams  Health Maintenance Due  Topic Date Due   COVID-19 Vaccine (1) Never done   INFLUENZA VACCINE  03/13/2022    Past Medical History:  Diagnosis Date   Abnormal LFTs 03/03/2011   Body mass index (BMI) 29.0-29.9, adult 03/21/2022   Chronic nonintractable headache 02/23/2022   Family history of early CAD 01/26/2022   Fatigue 03/21/2022   Fatty liver    Gastroparesis    GERD (gastroesophageal reflux disease)    Hiatal hernia    HTN (hypertension) 01/23/2022   Hyperlipidemia 02/23/2022   Hypogonadism in male 02/23/2022  Insulin resistance 03/21/2022   Iron deficiency 03/21/2022   Joint pain 03/21/2022   Kidney stone    Low libido 03/21/2022   Low testosterone 03/21/2022   Mixed hyperlipidemia 03/21/2022   NAFLD (nonalcoholic fatty liver disease) 05/01/2011   Nonspecific elevation of levels of transaminase or lactic acid dehydrogenase (LDH)    Overweight with body mass index (BMI) of 29 to 29.9 in adult 02/23/2022   Peri-rectal abscess 07/28/2019   Perirectal abscess 02/24/2022   Preventative health care 09/08/2014   Secondary polycythemia 03/21/2022   Testicular hypofunction 03/21/2022    Tobacco abuse 07/20/2010   Qualifier: Diagnosis of  By: Elizebeth Koller MD, Mora Appl     Past Surgical History:  Procedure Laterality Date   INCISION AND DRAINAGE ABSCESS N/A 02/25/2022   Procedure: INCISION AND DRAINAGE PERI-RECTAL ABSCESS;  Surgeon: Coralie Keens, MD;  Location: [facility];  Service: General;  Laterality: N/A;   INCISION AND DRAINAGE PERIRECTAL ABSCESS N/A 04/18/2019   Procedure: IRRIGATION AND DEBRIDEMENT PERIRECTAL ABSCESS EXAM UNDER ANESTHESIA;  Surgeon: Armandina Gemma, MD;  Location: [facility];  Service: General;  Laterality: N/A;   INCISION AND DRAINAGE PERIRECTAL ABSCESS N/A 07/29/2019   Procedure: IRRIGATION AND DEBRIDEMENT PERIRECTAL ABSCESS;  Surgeon: [name], MD;  Location: [facility];  Service: General;  Laterality: N/A;   lasix eye surgery Bilateral    TONSILLECTOMY     UPPER GASTROINTESTINAL ENDOSCOPY  2012   Dr.Patterson    Family History  Problem Relation Age of Onset   Liver disease Mother        fatty liver- she is overweight   CAD Father    CAD Paternal Grandfather        bypass   CAD Paternal Uncle        died from MI   CAD Paternal Uncle        died from MI   CAD Paternal Great-grandfather    Colon cancer Neg Hx    Colon polyps Neg Hx    Diabetes Neg Hx    Kidney disease Neg Hx    Gallbladder disease Neg Hx    Esophageal cancer Neg Hx     Social History   Socioeconomic History   Marital status: Married    Spouse name: Not on file   Number of children: 2   Years of education: Not on file   Highest education level: Not on file  Occupational History   Occupation: Comptroller: [employer]  Tobacco Use   Smoking status: Every Day    Packs/day: 1.00    Years: 30.00    Total pack years: 30.00    Types: Cigarettes    Start date: 04/13/2017   Smokeless tobacco: Former    Types: Nurse, children's Use: Never used  Substance and Sexual Activity   Alcohol use: Yes    Comment: occasional   Drug use: No   Sexual  activity: Yes    Partners: Female  Other Topics Concern   Not on file  Social History Narrative   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   Daughter-2013   Son- 2009   Married   Enjoys playing golf   Completed 12th grade   No DWI.    Social Determinants of Health   Financial Resource Strain: Not on file  Food Insecurity: Not on file  Transportation Needs: Not on file  Physical Activity: Not on file  Stress: Not  on file  Social Connections: Not on file  Intimate Partner Violence: Not on file    Outpatient Medications Prior to Visit  Medication Sig Dispense Refill   amLODipine (NORVASC) 2.5 MG tablet TAKE 1 TABLET BY MOUTH DAILY 90 tablet 0   anastrozole (ARIMIDEX) 1 MG tablet Take 0.5 mg by mouth See admin instructions. Take 0.5 mg by mouth every other Thursday     Coenzyme Q10 (CO Q10) 200 MG CAPS Take 200 mg by mouth daily.     doxycycline (VIBRAMYCIN) 100 MG capsule Take 100 mg by mouth 2 (two) times daily.     fluticasone (FLONASE) 50 MCG/ACT nasal spray Place 1-2 sprays into both nostrils daily.     ibuprofen (ADVIL) 600 MG tablet Take 1 tablet (600 mg total) by mouth every 6 (six) hours as needed for moderate pain. Take with food 30 tablet 0   MELATONIN GUMMIES PO Take 2 tablets by mouth at bedtime.     milk thistle 175 MG tablet Take 175 mg by mouth daily.     multivitamin (ONE-A-DAY MEN'S) TABS tablet Take 1 tablet by mouth daily with breakfast.     Omega-3 Fatty Acids (FISH OIL PO) Take 1 capsule by mouth daily.     pantoprazole (PROTONIX) 40 MG tablet TAKE ONE TABLET BY MOUTH DAILY 90 tablet 1   Probiotic Product (PROBIOTIC DAILY) CAPS Take 1 capsule by mouth daily.     rizatriptan (MAXALT) 10 MG tablet Take 1 tablet (10 mg total) by mouth as needed for migraine. May repeat in 2 hours if needed 10 tablet 6   rosuvastatin (CRESTOR) 10 MG tablet Take 1 tablet (10 mg total) by mouth daily. 90 tablet 3   testosterone cypionate (DEPOTESTOSTERONE  CYPIONATE) 200 MG/ML injection Inject 240 mg into the muscle every Thursday.     No facility-administered medications prior to visit.    No Known Allergies  Review of Systems  Constitutional:  Negative for fever.  HENT:  Negative for congestion, hearing loss, sinus pain and sore throat.   Eyes:  Negative for blurred vision and double vision.  Respiratory:  Negative for cough, shortness of breath and wheezing.   Cardiovascular:  Negative for palpitations.  Gastrointestinal:  Negative for blood in stool, constipation, diarrhea, nausea and vomiting.  Genitourinary:  Negative for dysuria, frequency and hematuria.  Musculoskeletal:  Negative for joint pain and myalgias.  Skin:  Negative for rash.       (-) New Moles  Neurological:  Negative for headaches.  Psychiatric/Behavioral:  Negative for depression. The patient is not nervous/anxious.        Objective:    Physical Exam Constitutional:      General: He is not in acute distress.    Appearance: Normal appearance. He is not ill-appearing.  HENT:     Head: Normocephalic and atraumatic.     Right Ear: Tympanic membrane, ear canal and external ear normal.     Left Ear: Tympanic membrane, ear canal and external ear normal.     Mouth/Throat:     Pharynx: No oropharyngeal exudate.  Eyes:     Extraocular Movements: Extraocular movements intact.     Pupils: Pupils are equal, round, and reactive to light.  Neck:     Thyroid: No thyromegaly.  Cardiovascular:     Rate and Rhythm: Normal rate and regular rhythm.     Heart sounds: Normal heart sounds. No murmur heard.    No gallop.  Pulmonary:     Effort: Pulmonary  effort is normal. No respiratory distress.     Breath sounds: Normal breath sounds. No wheezing or rales.  Abdominal:     General: Bowel sounds are normal. There is no distension.     Palpations: Abdomen is soft.     Tenderness: There is no abdominal tenderness. There is no guarding.  Musculoskeletal:     Comments: 5/5  strength in both upper and lower extremities    Lymphadenopathy:     Cervical: No cervical adenopathy.  Skin:    General: Skin is warm and dry.  Neurological:     Mental Status: He is alert and oriented to person, place, and time.     Deep Tendon Reflexes:     Reflex Scores:      Patellar reflexes are 2+ on the right side and 2+ on the left side. Psychiatric:        Mood and Affect: Mood normal.        Behavior: Behavior normal.        Judgment: Judgment normal.     BP 119/84 (BP Location: Right Arm, Patient Position: Sitting, Cuff Size: Large)   Pulse 86   Temp 98 F (36.7 C) (Oral)   Resp 16   Ht _0  (1.88 m)   Wt 217 lb (98.4 kg)   SpO2 95%   BMI 27.86 kg/m  Wt Readings from Last 3 Encounters:  06/19/22 217 lb (98.4 kg)  06/13/22 230 lb 2 oz (104.4 kg)  04/27/22 225 lb (102.1 kg)       Assessment & Plan:   Problem List Items Addressed This Visit       Unprioritized   Tobacco abuse    Patient is motivated to quit. Would like restart chantix.       Preventative health care - Primary    Wt Readings from Last 3 Encounters:  06/19/22 217 lb (98.4 kg)  06/13/22 230 lb 2 oz (104.4 kg)  04/27/22 225 lb (102.1 kg)  Continue healthy diet, plans to add more exercise after the new year when he has more time. Colo up to date. Tetanus up to date. Declines covid vaccine. Flu shot today.      Perirectal abscess    Stable following surgical repair.       Mixed hyperlipidemia    States he is taking crestor 45m QOD, sometimes daily. Check follow up lipid panel.       Relevant Orders   Lipid panel   HTN (hypertension)    BP Readings from Last 3 Encounters:  06/19/22 119/84  06/13/22 (!) 121/91  04/27/22 120/87  Stable on amlodipine 2.528m Continue same.       Relevant Orders   Comp Met (CMET)   GERD (gastroesophageal reflux disease)    Continues protonix.  Stable.       Other Visit Diagnoses     Needs flu shot       Relevant Orders   Flu Vaccine QUAD  6+ mos PF IM (Fluarix Quad PF)   Leukocytosis, unspecified type       Relevant Orders   CBC with Differential/Platelet      Meds ordered this encounter  Medications   Varenicline Tartrate, Starter, (CHANTIX STARTING MONTH PAK) 0.5 MG X 11 & 1 MG X 42 TBPK    Sig: Take per package instructions    Dispense:  53 each    Refill:  0    Order Specific Question:   Supervising Provider    Answer:  BLYTH, STACEY A [[id number]]    I, Nance Pear, NP, personally preformed the services described in this documentation.  All medical record entries made by the scribe were at my direction and in my presence.  I have reviewed the chart and discharge instructions (if applicable) and agree that the record reflects my personal performance and is accurate and complete. 06/19/2022   I,Amber Collins,acting as a scribe for Nance Pear, NP.,have documented all relevant documentation on the behalf of Nance Pear, NP,as directed by  Nance Pear, NP while in the presence of Nance Pear, NP.    Nance Pear, NP

## 2022-06-19 NOTE — Assessment & Plan Note (Signed)
Patient is motivated to quit. Would like restart chantix.

## 2022-06-19 NOTE — Assessment & Plan Note (Signed)
Stable following surgical repair.

## 2022-06-19 NOTE — Assessment & Plan Note (Addendum)
Wt Readings from Last 3 Encounters:  06/19/22 217 lb (98.4 kg)  06/13/22 230 lb 2 oz (104.4 kg)  04/27/22 225 lb (102.1 kg)   Continue healthy diet, plans to add more exercise after the new year when he has more time. Colo up to date. Tetanus up to date. Declines covid vaccine. Flu shot today.

## 2022-06-19 NOTE — Assessment & Plan Note (Signed)
BP Readings from Last 3 Encounters:  06/19/22 119/84  06/13/22 (!) 121/91  04/27/22 120/87   Stable on amlodipine 2.5mg . Continue same.

## 2022-07-02 ENCOUNTER — Ambulatory Visit: Payer: Managed Care, Other (non HMO) | Admitting: Cardiology

## 2022-07-02 ENCOUNTER — Encounter: Payer: Self-pay | Admitting: Family

## 2022-07-11 ENCOUNTER — Encounter: Payer: Self-pay | Admitting: Family

## 2022-07-11 ENCOUNTER — Other Ambulatory Visit: Payer: Self-pay | Admitting: Family

## 2022-07-11 MED ORDER — VARENICLINE TARTRATE 1 MG PO TABS: 1.0000 mg | ORAL_TABLET | Freq: Two times a day (BID) | ORAL | 1 refills | Status: DC

## 2022-07-14 ENCOUNTER — Other Ambulatory Visit: Payer: Self-pay | Admitting: Family

## 2022-08-01 ENCOUNTER — Other Ambulatory Visit: Payer: Self-pay | Admitting: Student

## 2022-08-01 DIAGNOSIS — K611 Rectal abscess: Secondary | ICD-10-CM

## 2022-08-02 ENCOUNTER — Ambulatory Visit
Admission: RE | Admit: 2022-08-02 | Discharge: 2022-08-02 | Disposition: A | Discharge status: Home / Self Care | Payer: Managed Care, Other (non HMO) | Source: Ambulatory Visit | Attending: Student | Admitting: Student

## 2022-08-02 DIAGNOSIS — K611 Rectal abscess: Secondary | ICD-10-CM

## 2022-08-02 MED ORDER — IOPAMIDOL (ISOVUE-300) INJECTION 61%
100.0000 mL | Freq: Once | INTRAVENOUS | Status: AC | PRN
  Administered 2022-08-02: 100 mL via INTRAVENOUS

## 2022-12-09 ENCOUNTER — Other Ambulatory Visit: Payer: Self-pay | Admitting: Family

## 2023-01-23 ENCOUNTER — Other Ambulatory Visit: Payer: Self-pay | Admitting: Family

## 2023-03-24 ENCOUNTER — Other Ambulatory Visit: Payer: Self-pay | Admitting: Cardiology

## 2023-04-20 ENCOUNTER — Other Ambulatory Visit: Payer: Self-pay | Admitting: Cardiology

## 2023-05-04 ENCOUNTER — Other Ambulatory Visit: Payer: Self-pay | Admitting: Cardiology

## 2023-05-15 ENCOUNTER — Other Ambulatory Visit: Payer: Self-pay

## 2023-05-15 MED ORDER — AMLODIPINE BESYLATE 2.5 MG PO TABS: 2.5000 mg | ORAL_TABLET | Freq: Every day | ORAL | 0 refills | Status: DC

## 2023-05-18 ENCOUNTER — Other Ambulatory Visit: Payer: Self-pay | Admitting: Cardiology

## 2023-06-11 ENCOUNTER — Other Ambulatory Visit: Payer: Self-pay | Admitting: Family

## 2023-06-11 NOTE — Telephone Encounter (Signed)
Appt already scheduled for 06/26/23

## 2023-06-11 NOTE — Telephone Encounter (Signed)
Please contact pt to schedule a complete physical in November after 11/7.

## 2023-06-26 ENCOUNTER — Encounter: Payer: Self-pay | Admitting: Family

## 2023-06-26 ENCOUNTER — Ambulatory Visit: Payer: 59 | Admitting: Family

## 2023-06-26 VITALS — BP 116/82 | HR 80 | Temp 98.7°F | Resp 16 | Ht 74.0 in | Wt 241.0 lb

## 2023-06-26 DIAGNOSIS — Z0000 Encounter for general adult medical examination without abnormal findings: Secondary | ICD-10-CM

## 2023-06-26 DIAGNOSIS — K611 Rectal abscess: Secondary | ICD-10-CM

## 2023-06-26 DIAGNOSIS — E782 Mixed hyperlipidemia: Secondary | ICD-10-CM | POA: Diagnosis not present

## 2023-06-26 DIAGNOSIS — Z23 Encounter for immunization: Secondary | ICD-10-CM

## 2023-06-26 DIAGNOSIS — R7989 Other specified abnormal findings of blood chemistry: Secondary | ICD-10-CM

## 2023-06-26 DIAGNOSIS — D72829 Elevated white blood cell count, unspecified: Secondary | ICD-10-CM

## 2023-06-26 DIAGNOSIS — G43909 Migraine, unspecified, not intractable, without status migrainosus: Secondary | ICD-10-CM | POA: Diagnosis not present

## 2023-06-26 DIAGNOSIS — K219 Gastro-esophageal reflux disease without esophagitis: Secondary | ICD-10-CM

## 2023-06-26 DIAGNOSIS — I10 Essential (primary) hypertension: Secondary | ICD-10-CM | POA: Diagnosis not present

## 2023-06-26 DIAGNOSIS — Z720 Tobacco use: Secondary | ICD-10-CM | POA: Diagnosis not present

## 2023-06-26 LAB — COMPREHENSIVE METABOLIC PANEL
ALT: 47 U/L (ref 0–53)
AST: 32 U/L (ref 0–37)
Albumin: 4.6 g/dL (ref 3.5–5.2)
Alkaline Phosphatase: 73 U/L (ref 39–117)
BUN: 10 mg/dL (ref 6–23)
CO2: 28 meq/L (ref 19–32)
Calcium: 9.5 mg/dL (ref 8.4–10.5)
Chloride: 101 meq/L (ref 96–112)
Creatinine, Ser: 1.06 mg/dL (ref 0.40–1.50)
GFR: 83.63 mL/min (ref >60.00)
Glucose, Bld: 77 mg/dL (ref 70–99)
Potassium: 4 meq/L (ref 3.5–5.1)
Sodium: 137 meq/L (ref 135–145)
Total Bilirubin: 0.5 mg/dL (ref 0.2–1.2)
Total Protein: 7.2 g/dL (ref 6.0–8.3)

## 2023-06-26 LAB — LIPID PANEL
Cholesterol: 179 mg/dL (ref 0–200)
HDL: 33.6 mg/dL — ABNORMAL LOW (ref >39.00)
LDL Cholesterol: 103 mg/dL — ABNORMAL HIGH (ref 0–99)
NonHDL: 145.67
Total CHOL/HDL Ratio: 5
Triglycerides: 214 mg/dL — ABNORMAL HIGH (ref 0.0–149.0)
VLDL: 42.8 mg/dL — ABNORMAL HIGH (ref 0.0–40.0)

## 2023-06-26 MED ORDER — ROSUVASTATIN CALCIUM 10 MG PO TABS: 10.0000 mg | ORAL_TABLET | Freq: Every day | ORAL | 1 refills | Status: DC

## 2023-06-26 MED ORDER — AMLODIPINE BESYLATE 2.5 MG PO TABS: 2.5000 mg | ORAL_TABLET | Freq: Every day | ORAL | 1 refills | Status: DC

## 2023-06-26 NOTE — Assessment & Plan Note (Signed)
Reports no recent symptoms.

## 2023-06-26 NOTE — Addendum Note (Signed)
Addended by: Wilford Corner on: 06/26/2023 02:26 PM   Modules accepted: Orders

## 2023-06-26 NOTE — Assessment & Plan Note (Signed)
BP stable. Continues amlodipine.

## 2023-06-26 NOTE — Patient Instructions (Signed)
VISIT SUMMARY:  Today, we reviewed your overall health during your annual physical. We discussed your smoking habits, rectal issues, cholesterol levels, migraines, testosterone therapy, blood pressure, acid reflux, and weight management. We also administered your flu vaccine and ordered routine blood work.  YOUR PLAN:  -TOBACCO USE: You are currently smoking 10-15 cigarettes per day and are not ready to quit. Smoking can lead to serious health issues, and I encourage you to consider quitting when you feel ready.  -RECURRENT PERIRECTAL ABSCESS: You have had no recent recurrence of your rectal issue since the last episode in summer 2023. We will continue with the current management plan.  -HYPERLIPIDEMIA: Hyperlipidemia means having high cholesterol levels, which can increase the risk of heart disease. We will continue your current medication, rosuvastatin (Crestor), and I have refilled your prescription.  -MIGRAINES: You have not had any recent migraines. We will continue with the current management plan.  -TESTOSTERONE REPLACEMENT THERAPY: You are receiving testosterone injections every other week by [facility] MD. This therapy helps manage low testosterone levels.  -HYPERTENSION: Hypertension means high blood pressure, which can lead to heart problems if not controlled. Your blood pressure is well-controlled with your current medication, and I have refilled your prescription.  -GASTROESOPHAGEAL REFLUX DISEASE (GERD): GERD is a condition where stomach acid frequently flows back into the tube connecting your mouth and stomach. Your symptoms are controlled with pantoprazole as needed, so continue taking it when necessary.  -WEIGHT MANAGEMENT: You have experienced recent weight gain and expressed a desire to improve your diet and exercise habits. I encourage you to follow a healthy diet and engage in regular physical activity.  -GENERAL HEALTH MAINTENANCE: We administered your influenza vaccine today  and ordered routine blood work to monitor your overall health. Please follow up in 6 months for a blood pressure check.  INSTRUCTIONS:  Please follow up in 6 months for a blood pressure check. Continue with your current medications and management plans as discussed. Consider smoking cessation when you feel ready, and focus on maintaining a healthy diet and regular exercise.

## 2023-06-26 NOTE — Assessment & Plan Note (Signed)
Stable

## 2023-06-26 NOTE — Assessment & Plan Note (Signed)
Not ready to quit.  

## 2023-06-26 NOTE — Assessment & Plan Note (Signed)
Stable on pantoprazole- usually only needs once a day.

## 2023-06-26 NOTE — Progress Notes (Signed)
Subjective:     Patient ID: Gregory Fowler, male    DOB: 21-May-1976, 47 y.o.   MRN: [id number]  No chief complaint on file.   HPI  Discussed the use of AI scribe software for clinical note transcription with the patient, who gave verbal consent to proceed.  History of Present Illness   The patient, with a history of rectal issues, high cholesterol, and migraines, presents for an annual physical. He reports smoking 10-15 cigarettes daily and is not ready to quit. He has had no recurrence of the rectal issue, which has occurred three times in the past, the last episode being in the summer of 2023. He has not had any recent migraines. He is on testosterone therapy, receiving injections every other week. He reports frequent urination due to high water intake. He has a history of fatty liver and acknowledges the need to focus on a healthier diet and exercise, as his weight has increased recently. He also reports acne, which he attributes to the testosterone therapy, and has been prescribed a new medication by a dermatologist. He has prescription glasses but does not wear them regularly due to discomfort and headaches. He has no current concerns about depression or anxiety.     Wt Readings from Last 3 Encounters:  06/26/23 241 lb (109.3 kg)  06/19/22 217 lb (98.4 kg)  06/13/22 230 lb 2 oz (104.4 kg)         Health Maintenance Due  Topic Date Due   INFLUENZA VACCINE  03/14/2023   COVID-19 Vaccine (1 - 2023-24 season) Never done    Past Medical History:  Diagnosis Date   Abnormal LFTs 03/03/2011   Body mass index (BMI) 29.0-29.9, adult 03/21/2022   Chronic nonintractable headache 02/23/2022   Family history of early CAD 01/26/2022   Fatigue 03/21/2022   Fatty liver    Gastroparesis    GERD (gastroesophageal reflux disease)    Hiatal hernia    HTN (hypertension) 01/23/2022   Hyperlipidemia 02/23/2022   Hypogonadism in male 02/23/2022   Insulin resistance 03/21/2022    Iron deficiency 03/21/2022   Joint pain 03/21/2022   Kidney stone    Low libido 03/21/2022   Low testosterone 03/21/2022   Migraine without status migrainosus, not intractable 04/27/2022   Mixed hyperlipidemia 03/21/2022   NAFLD (nonalcoholic fatty liver disease) 14/78/2956   Nonspecific elevation of levels of transaminase or lactic acid dehydrogenase (LDH)    Overweight with body mass index (BMI) of 29 to 29.9 in adult 02/23/2022   Perirectal abscess 02/24/2022   Preventative health care 09/08/2014   Secondary polycythemia 03/21/2022   Testicular hypofunction 03/21/2022   Tobacco abuse 07/20/2010   Qualifier: Diagnosis of  By: Rodena Medin MD, Acie Fredrickson     Past Surgical History:  Procedure Laterality Date   INCISION AND DRAINAGE ABSCESS N/A 02/25/2022   Procedure: INCISION AND DRAINAGE PERI-RECTAL ABSCESS;  Surgeon: Abigail Miyamoto, MD;  Location: [facility];  Service: General;  Laterality: N/A;   INCISION AND DRAINAGE PERIRECTAL ABSCESS N/A 04/18/2019   Procedure: IRRIGATION AND DEBRIDEMENT PERIRECTAL ABSCESS EXAM UNDER ANESTHESIA;  Surgeon: Darnell Level, MD;  Location: [facility];  Service: General;  Laterality: N/A;   INCISION AND DRAINAGE PERIRECTAL ABSCESS N/A 07/29/2019   Procedure: IRRIGATION AND DEBRIDEMENT PERIRECTAL ABSCESS;  Surgeon: Gaynelle Adu, MD;  Location: [facility];  Service: General;  Laterality: N/A;   lasix eye surgery Bilateral    TONSILLECTOMY     UPPER GASTROINTESTINAL ENDOSCOPY  2012   Dr.Patterson  Family History  Problem Relation Age of Onset   Liver disease Mother        fatty liver- she is overweight   CAD Father    CAD Paternal Grandfather        bypass   CAD Paternal Uncle        died from MI   CAD Paternal Uncle        died from MI   CAD Paternal Great-grandfather    Colon cancer Neg Hx    Colon polyps Neg Hx    Diabetes Neg Hx    Kidney disease Neg Hx    Gallbladder disease Neg Hx    Esophageal cancer Neg Hx     Social History   Socioeconomic  History   Marital status: Married    Spouse name: Not on file   Number of children: 2   Years of education: Not on file   Highest education level: Not on file  Occupational History   Occupation: Film/video editor: [employer]  Tobacco Use   Smoking status: Every Day    Current packs/day: 1.00    Average packs/day: 1 pack/day for 30.0 years (30.0 ttl pk-yrs)    Types: Cigarettes    Start date: 04/13/2017   Smokeless tobacco: Former    Types: Engineer, drilling   Vaping status: Never Used  Substance and Sexual Activity   Alcohol use: Yes    Comment: occasional   Drug use: No   Sexual activity: Yes    Partners: Female  Other Topics Concern   Not on file  Social History Narrative   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   Daughter-2013   Son- 2009   Married   Enjoys playing golf   Completed 12th grade   No DWI.    Social Determinants of Health   Financial Resource Strain: Not on file  Food Insecurity: Not on file  Transportation Needs: Not on file  Physical Activity: Not on file  Stress: Not on file  Social Connections: Unknown (12/26/2021)   Received from [facility], [facility]   Social Network    Social Network: Not on file  Intimate Partner Violence: Unknown (11/17/2021)   Received from [facility], [facility]   HITS    Physically Hurt: Not on file    Insult or Talk Down To: Not on file    Threaten Physical Harm: Not on file    Scream or Curse: Not on file    Outpatient Medications Prior to Visit  Medication Sig Dispense Refill   anastrozole (ARIMIDEX) 1 MG tablet Take 0.5 mg by mouth See admin instructions. Take 0.5 mg by mouth every other Thursday     Coenzyme Q10 (CO Q10) 200 MG CAPS Take 200 mg by mouth daily.     fluticasone (FLONASE) 50 MCG/ACT nasal spray Place 1-2 sprays into both nostrils daily.     milk thistle 175 MG tablet Take 175 mg by mouth daily.     multivitamin (ONE-A-DAY MEN'S) TABS tablet  Take 1 tablet by mouth daily with breakfast.     Omega-3 Fatty Acids (FISH OIL PO) Take 1 capsule by mouth daily.     pantoprazole (PROTONIX) 40 MG tablet TAKE 1 TABLET BY MOUTH DAILY 90 tablet 0   Probiotic Product (PROBIOTIC DAILY) CAPS Take 1 capsule by mouth daily.     testosterone cypionate (DEPOTESTOSTERONE CYPIONATE) 200 MG/ML injection Inject 240 mg  into the muscle every Thursday.     amLODipine (NORVASC) 2.5 MG tablet Take 1 tablet (2.5 mg total) by mouth daily. 90 tablet 0   rosuvastatin (CRESTOR) 10 MG tablet Take 1 tablet (10 mg total) by mouth daily. Patient needs an appointment for further refills. 3 rd/final attempt 15 tablet 0   doxycycline (VIBRAMYCIN) 100 MG capsule Take 100 mg by mouth 2 (two) times daily.     ibuprofen (ADVIL) 600 MG tablet Take 1 tablet (600 mg total) by mouth every 6 (six) hours as needed for moderate pain. Take with food 30 tablet 0   MELATONIN GUMMIES PO Take 2 tablets by mouth at bedtime.     rizatriptan (MAXALT) 10 MG tablet Take 1 tablet (10 mg total) by mouth as needed for migraine. May repeat in 2 hours if needed 10 tablet 6   varenicline (CHANTIX CONTINUING MONTH PAK) 1 MG tablet Take 1 tablet (1 mg total) by mouth 2 (two) times daily. 60 tablet 1   No facility-administered medications prior to visit.    No Known Allergies  Review of Systems  Constitutional:  Negative for weight loss.  HENT:  Negative for congestion and hearing loss.   Eyes:  Positive for blurred vision (has prescription glasses).  Respiratory:  Negative for cough.   Cardiovascular:  Negative for leg swelling.  Gastrointestinal:  Negative for constipation, diarrhea and heartburn.  Genitourinary:  Negative for dysuria and urgency.  Musculoskeletal:  Negative for joint pain and myalgias.  Skin:  Negative for rash.  Neurological:  Negative for headaches.  Psychiatric/Behavioral:         Denies depression/anxety       Objective:    Physical Exam   BP 116/82 (BP  Location: Right Arm, Patient Position: Sitting, Cuff Size: Large)   Pulse 80   Temp 98.7 F (37.1 C) (Oral)   Resp 16   Ht 6\' 2"  (1.88 m)   Wt 241 lb (109.3 kg)   SpO2 98%   BMI 30.94 kg/m  Wt Readings from Last 3 Encounters:  06/26/23 241 lb (109.3 kg)  06/19/22 217 lb (98.4 kg)  06/13/22 230 lb 2 oz (104.4 kg)      Physical Exam  Constitutional: He is oriented to person, place, and time. He appears well-developed and well-nourished. No distress.  HENT:  Head: Normocephalic and atraumatic.  Right Ear: Tympanic membrane and ear canal normal.  Left Ear: Tympanic membrane and ear canal normal.  Mouth/Throat: Oropharynx is clear and moist.  Eyes: Pupils are equal, round, and reactive to light. No scleral icterus.  Neck: Normal range of motion. No thyromegaly present.  Cardiovascular: Normal rate and regular rhythm.   No murmur heard. Pulmonary/Chest: Effort normal and breath sounds normal. No respiratory distress. He has no wheezes. He has no rales. He exhibits no tenderness.  Abdominal: Soft. Bowel sounds are normal. He exhibits no distension and no mass. There is no tenderness. There is no rebound and no guarding.  Musculoskeletal: He exhibits no edema.  Lymphadenopathy:    He has no cervical adenopathy.  Neurological: He is alert and oriented to person, place, and time. He has normal patellar reflexes. He exhibits normal muscle tone. Coordination normal.  Skin: Skin is warm and dry.  Psychiatric: He has a normal mood and affect. His behavior is normal. Judgment and thought content normal.           Assessment & Plan:    Assessment & Plan:   Problem List Items Addressed  This Visit       Unprioritized   Tobacco abuse    Not ready to quit.        Preventative health care - Primary    Discussed diet/exercise/weight loss/smoking cessation. Not ready to quit smoking. Flu shot today.  Colo up to date.      Perirectal abscess    Reports no recent symptoms.        Mixed hyperlipidemia    Lab Results  Component Value Date   CHOL 166 06/19/2022   HDL 36.70 (L) 06/19/2022   LDLCALC 94 06/19/2022   LDLDIRECT 146.0 05/23/2021   TRIG 175.0 (H) 06/19/2022   CHOLHDL 5 06/19/2022   Tolerating rosuvastatin.  Update lipid panel.       Relevant Medications   amLODipine (NORVASC) 2.5 MG tablet   rosuvastatin (CRESTOR) 10 MG tablet   Other Relevant Orders   Lipid panel   Migraine without status migrainosus, not intractable    Stable.        Relevant Medications   amLODipine (NORVASC) 2.5 MG tablet   rosuvastatin (CRESTOR) 10 MG tablet   Low testosterone    This is being managed by [facility] with weekly testosterone injections.       HTN (hypertension)    BP stable. Continues amlodipine.      Relevant Medications   amLODipine (NORVASC) 2.5 MG tablet   rosuvastatin (CRESTOR) 10 MG tablet   Other Relevant Orders   Comp Met (CMET)   GERD (gastroesophageal reflux disease)    Stable on pantoprazole- usually only needs once a day.       Other Visit Diagnoses     Leukocytosis, unspecified type       Relevant Orders   CBC w/Diff       I have discontinued Hutson G. [place]bury "Greg"'s MELATONIN GUMMIES PO, doxycycline, ibuprofen, rizatriptan, and varenicline. I have also changed his rosuvastatin. Additionally, I am having him maintain his Probiotic Daily, Omega-3 Fatty Acids (FISH OIL PO), milk thistle, anastrozole, testosterone cypionate, fluticasone, multivitamin, Co Q10, pantoprazole, and amLODipine.  Meds ordered this encounter  Medications   amLODipine (NORVASC) 2.5 MG tablet    Sig: Take 1 tablet (2.5 mg total) by mouth daily.    Dispense:  90 tablet    Refill:  1    Order Specific Question:   Supervising Provider    Answer:   Danise Edge A [[id number]]   rosuvastatin (CRESTOR) 10 MG tablet    Sig: Take 1 tablet (10 mg total) by mouth daily.    Dispense:  90 tablet    Refill:  1    Patient needs an appointment for further refills. 3  rd/final attempt    Order Specific Question:   Supervising Provider    Answer:   Danise Edge A [[id number]]

## 2023-06-26 NOTE — Assessment & Plan Note (Signed)
Discussed diet/exercise/weight loss/smoking cessation. Not ready to quit smoking. Flu shot today.  Colo up to date.

## 2023-06-26 NOTE — Assessment & Plan Note (Signed)
This is being managed by [facility] with weekly testosterone injections.

## 2023-06-26 NOTE — Assessment & Plan Note (Signed)
Lab Results  Component Value Date   CHOL 166 06/19/2022   HDL 36.70 (L) 06/19/2022   LDLCALC 94 06/19/2022   LDLDIRECT 146.0 05/23/2021   TRIG 175.0 (H) 06/19/2022   CHOLHDL 5 06/19/2022   Tolerating rosuvastatin.  Update lipid panel.

## 2023-07-25 ENCOUNTER — Telehealth: Payer: Self-pay | Admitting: Family

## 2023-07-25 NOTE — Telephone Encounter (Signed)
Younique, [employer] rep, called with patient to question bill on 06/26/23 which was his physical. Insurance rep said she explained to him about anything outside of wellness visit would incur ov charge. Diagnoses/CPT codes do match up with preventative. Pt said they have been covered in the past. Reiterated what insurance agent said and advised that he would have also signed a document at check in saying he understood that. Pt said his provider asked him about a previous surgery and asked whether he should have not answered. Advised that his provider would be concerned with his health and would ask questions but his insurance company determines benefits/payment of claims. Patient/Insurance requesting a coding review. Please call patient to advise resolution.

## 2023-07-30 NOTE — Telephone Encounter (Signed)
Spoke to pt and made him aware

## 2023-09-06 ENCOUNTER — Other Ambulatory Visit: Payer: Self-pay | Admitting: Family

## 2023-12-06 ENCOUNTER — Other Ambulatory Visit: Payer: Self-pay | Admitting: Family

## 2023-12-26 ENCOUNTER — Other Ambulatory Visit: Payer: Self-pay | Admitting: Family

## 2023-12-26 DIAGNOSIS — E782 Mixed hyperlipidemia: Secondary | ICD-10-CM

## 2024-01-22 ENCOUNTER — Other Ambulatory Visit: Payer: Self-pay | Admitting: Family

## 2024-01-22 DIAGNOSIS — E782 Mixed hyperlipidemia: Secondary | ICD-10-CM

## 2024-01-22 NOTE — Telephone Encounter (Signed)
Please contact pt to schedule OV.  

## 2024-01-30 NOTE — Telephone Encounter (Signed)
 Pt stated he will call back

## 2024-02-04 ENCOUNTER — Other Ambulatory Visit: Payer: Self-pay | Admitting: Family

## 2024-02-04 DIAGNOSIS — I10 Essential (primary) hypertension: Secondary | ICD-10-CM

## 2024-02-04 NOTE — Telephone Encounter (Signed)
Please contact pt to set up appointment

## 2024-03-06 ENCOUNTER — Other Ambulatory Visit: Payer: Self-pay | Admitting: Family

## 2024-03-06 DIAGNOSIS — I10 Essential (primary) hypertension: Secondary | ICD-10-CM

## 2024-04-06 ENCOUNTER — Other Ambulatory Visit: Payer: Self-pay | Admitting: Family

## 2024-04-06 DIAGNOSIS — I10 Essential (primary) hypertension: Secondary | ICD-10-CM

## 2024-04-06 NOTE — Telephone Encounter (Signed)
Please call patient to schedule visit.

## 2024-04-22 DIAGNOSIS — E291 Testicular hypofunction: Secondary | ICD-10-CM | POA: Diagnosis not present

## 2024-04-23 ENCOUNTER — Other Ambulatory Visit: Payer: Self-pay | Admitting: Family

## 2024-04-23 DIAGNOSIS — E782 Mixed hyperlipidemia: Secondary | ICD-10-CM

## 2024-04-29 DIAGNOSIS — E291 Testicular hypofunction: Secondary | ICD-10-CM | POA: Diagnosis not present

## 2024-05-05 ENCOUNTER — Other Ambulatory Visit: Payer: Self-pay | Admitting: Family

## 2024-05-05 DIAGNOSIS — I10 Essential (primary) hypertension: Secondary | ICD-10-CM

## 2024-05-05 NOTE — Telephone Encounter (Signed)
Please contact pt to schedule office visit. 

## 2024-05-05 NOTE — Telephone Encounter (Signed)
Left a voicemail for pt to schedule.

## 2024-05-06 DIAGNOSIS — E291 Testicular hypofunction: Secondary | ICD-10-CM | POA: Diagnosis not present

## 2024-05-13 DIAGNOSIS — E291 Testicular hypofunction: Secondary | ICD-10-CM | POA: Diagnosis not present

## 2024-05-20 DIAGNOSIS — E291 Testicular hypofunction: Secondary | ICD-10-CM | POA: Diagnosis not present

## 2024-05-27 DIAGNOSIS — E291 Testicular hypofunction: Secondary | ICD-10-CM | POA: Diagnosis not present

## 2024-06-03 DIAGNOSIS — E291 Testicular hypofunction: Secondary | ICD-10-CM | POA: Diagnosis not present

## 2024-06-05 ENCOUNTER — Other Ambulatory Visit: Payer: Self-pay | Admitting: Family

## 2024-06-05 DIAGNOSIS — I10 Essential (primary) hypertension: Secondary | ICD-10-CM

## 2024-06-10 DIAGNOSIS — E291 Testicular hypofunction: Secondary | ICD-10-CM | POA: Diagnosis not present

## 2024-06-10 DIAGNOSIS — Z79890 Hormone replacement therapy (postmenopausal): Secondary | ICD-10-CM | POA: Diagnosis not present

## 2024-06-17 DIAGNOSIS — R5383 Other fatigue: Secondary | ICD-10-CM | POA: Diagnosis not present

## 2024-06-17 DIAGNOSIS — M2550 Pain in unspecified joint: Secondary | ICD-10-CM | POA: Diagnosis not present

## 2024-06-17 DIAGNOSIS — R454 Irritability and anger: Secondary | ICD-10-CM | POA: Diagnosis not present

## 2024-06-17 DIAGNOSIS — E291 Testicular hypofunction: Secondary | ICD-10-CM | POA: Diagnosis not present

## 2024-06-24 DIAGNOSIS — E291 Testicular hypofunction: Secondary | ICD-10-CM | POA: Diagnosis not present

## 2024-07-01 ENCOUNTER — Ambulatory Visit (INDEPENDENT_AMBULATORY_CARE_PROVIDER_SITE_OTHER): Admitting: Family

## 2024-07-01 ENCOUNTER — Encounter: Payer: Self-pay | Admitting: Family

## 2024-07-01 VITALS — BP 114/74 | HR 85 | Temp 98.0°F | Resp 16 | Ht 74.0 in | Wt 236.0 lb

## 2024-07-01 DIAGNOSIS — Z23 Encounter for immunization: Secondary | ICD-10-CM | POA: Diagnosis not present

## 2024-07-01 DIAGNOSIS — E785 Hyperlipidemia, unspecified: Secondary | ICD-10-CM

## 2024-07-01 DIAGNOSIS — R0683 Snoring: Secondary | ICD-10-CM

## 2024-07-01 DIAGNOSIS — K219 Gastro-esophageal reflux disease without esophagitis: Secondary | ICD-10-CM

## 2024-07-01 DIAGNOSIS — E782 Mixed hyperlipidemia: Secondary | ICD-10-CM

## 2024-07-01 DIAGNOSIS — Z0000 Encounter for general adult medical examination without abnormal findings: Secondary | ICD-10-CM | POA: Diagnosis not present

## 2024-07-01 DIAGNOSIS — B359 Dermatophytosis, unspecified: Secondary | ICD-10-CM

## 2024-07-01 DIAGNOSIS — I10 Essential (primary) hypertension: Secondary | ICD-10-CM | POA: Diagnosis not present

## 2024-07-01 DIAGNOSIS — R7989 Other specified abnormal findings of blood chemistry: Secondary | ICD-10-CM

## 2024-07-01 MED ORDER — KETOCONAZOLE 2 % EX CREA: 1.0000 | TOPICAL_CREAM | Freq: Two times a day (BID) | CUTANEOUS | 0 refills | Status: AC

## 2024-07-01 NOTE — Assessment & Plan Note (Signed)
BP stable on amlodipine. Continue same.  

## 2024-07-01 NOTE — Progress Notes (Signed)
 Subjective:     Patient ID: Jenene Cathlyn Anon, male    DOB: 1976-06-13, 49 y.o.   MRN: [id number]  Chief Complaint  Patient presents with   Annual Exam    HPI  Discussed the use of AI scribe software for clinical note transcription with the patient, who gave verbal consent to proceed.  History of Present Illness   History of Present Illness Imad Nafis Farnan is a 48 year old male who presents for an annual physical exam.  He is updating his immunizations, having recently received a flu shot and planning to update his tetanus vaccine today. He has not received the hepatitis B vaccine series before.  He has a history of headaches that occur intermittently, lasting for a month or two at a time, and then resolving for about a year. He was previously prescribed medication for these headaches. He has not experienced headaches recently.  He smokes approximately three-quarters of a pack of cigarettes per day. He has used Chantix  in the past to quit smoking successfully but is not currently interested in quitting.  He experiences occasional heartburn, which is managed with pantoprazole  taken once daily. He also receives weekly testosterone injections at [facility] and reports feeling more energetic on this regimen.  He has a history of high triglycerides and is currently taking Crestor  (rosuvastatin ) for cholesterol management. He inquired about liver function tests, which were normal last year.  He has a family history of fatty liver disease in his mother and heart disease in his father, who passed away three years ago. His sister and brother are healthy.  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 He tries to go to the gym four to five times a week. He has two children, aged 72 and 43, and reports no changes in their health.  No current symptoms of cough, cold, skin rash, leg swelling, digestive issues, urinary problems, muscle or joint  pain, or depression and anxiety. He reports occasional tiredness, which he attributes to his work schedule.  He has a history of a fungal rash in the groin area, which he describes as occurring in warm, sweaty conditions. He has not had any surgeries this year and is up to date on his colonoscopy, which was performed in 2023.   Immunizations: flu, heplisav and Td today Diet: fair Exercise: goes to the gym 4-5 times a week Colonoscopy: 2023 due 2033 Vision: due Dental: up to date   Domenic Kaysin Brock is a 48 year old male who presents for an annual physical exam.  He is updating his immunizations, having recently received a flu shot and planning to update his tetanus vaccine today. He has not received the hepatitis B vaccine series before.  He has a history of headaches that occur intermittently, lasting for a month or two at a time, and then resolving for about a year. He was previously prescribed medication for these headaches. He has not experienced headaches recently.  He smokes approximately three-quarters of a pack of cigarettes per day. He has used Chantix  in the past to quit smoking successfully but is not currently interested in quitting.  He experiences occasional heartburn, which is managed with pantoprazole  taken once daily. He also receives weekly testosterone injections at [facility] and reports feeling more energetic on this regimen.  He has a history of high triglycerides and is currently taking Crestor  (rosuvastatin ) for cholesterol management. He inquired about liver function tests, which were  normal last year.  He has a family history of fatty liver disease in his mother and heart disease in his father, who passed away three years ago. His sister and brother are healthy.  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 He tries to go to the gym four to five times a week. He has two children, aged 64 and 63, and reports no changes in  their health.  No current symptoms of cough, cold, skin rash, leg swelling, digestive issues, urinary problems, muscle or joint pain, or depression and anxiety. He reports occasional tiredness, which he attributes to his work schedule.  He has a history of a fungal rash in the groin area, which he describes as occurring in warm, sweaty conditions. He has not had any surgeries this year and is up to date on his colonoscopy, which was performed in 2023.      Health Maintenance Due  Topic Date Due   COVID-19 Vaccine (1 - 2025-26 season) Never done    Past Medical History:  Diagnosis Date   Abnormal LFTs 03/03/2011   Body mass index (BMI) 29.0-29.9, adult 03/21/2022   Chronic nonintractable headache 02/23/2022   Family history of early CAD 01/26/2022   Fatigue 03/21/2022   Fatty liver    Gastroparesis    GERD (gastroesophageal reflux disease)    Hiatal hernia    HTN (hypertension) 01/23/2022   Hyperlipidemia 02/23/2022   Hypogonadism in male 02/23/2022   Insulin resistance 03/21/2022   Iron deficiency 03/21/2022   Joint pain 03/21/2022   Kidney stone    Low libido 03/21/2022   Low testosterone 03/21/2022   Migraine without status migrainosus, not intractable 04/27/2022   Mixed hyperlipidemia 03/21/2022   NAFLD (nonalcoholic fatty liver disease) 90/81/7987   Nonspecific elevation of levels of transaminase or lactic acid dehydrogenase (LDH)    Overweight with body mass index (BMI) of 29 to 29.9 in adult 02/23/2022   Perirectal abscess 02/24/2022   Preventative health care 09/08/2014   Secondary polycythemia 03/21/2022   Testicular hypofunction 03/21/2022   Tobacco abuse 07/20/2010   Qualifier: Diagnosis of  By: Eyvonne MD, Debby ORN     Past Surgical History:  Procedure Laterality Date   INCISION AND DRAINAGE ABSCESS N/A 02/25/2022   Procedure: INCISION AND DRAINAGE PERI-RECTAL ABSCESS;  Surgeon: Vernetta Berg, MD;  Location: [facility];  Service: General;  Laterality: N/A;    INCISION AND DRAINAGE PERIRECTAL ABSCESS N/A 04/18/2019   Procedure: IRRIGATION AND DEBRIDEMENT PERIRECTAL ABSCESS EXAM UNDER ANESTHESIA;  Surgeon: Eletha Boas, MD;  Location: [facility];  Service: General;  Laterality: N/A;   INCISION AND DRAINAGE PERIRECTAL ABSCESS N/A 07/29/2019   Procedure: IRRIGATION AND DEBRIDEMENT PERIRECTAL ABSCESS;  Surgeon: Tanda Locus, MD;  Location: [facility];  Service: General;  Laterality: N/A;   lasix eye surgery Bilateral    TONSILLECTOMY     UPPER GASTROINTESTINAL ENDOSCOPY  2012   Dr.Patterson    Family History  Problem Relation Age of Onset   Liver disease Mother        fatty liver- she is overweight   CAD Father    CAD Paternal Grandfather        bypass   CAD Paternal Uncle        died from MI   CAD Paternal Uncle        died from MI   CAD Paternal Great-grandfather    Colon cancer Neg Hx    Colon polyps Neg Hx  Diabetes Neg Hx    Kidney disease Neg Hx    Gallbladder disease Neg Hx    Esophageal cancer Neg Hx     Social History   Socioeconomic History   Marital status: Married    Spouse name: Not on file   Number of children: 2   Years of education: Not on file   Highest education level: Not on file  Occupational History   Occupation: Film/video Editor: [employer]  Tobacco Use   Smoking status: Every Day    Current packs/day: 1.00    Average packs/day: 1 pack/day for 31.0 years (31.0 ttl pk-yrs)    Types: Cigarettes    Start date: 04/13/2017   Smokeless tobacco: Former    Types: Engineer, Drilling   Vaping status: Never Used  Substance and Sexual Activity   Alcohol use: Yes    Comment: occasional   Drug use: No   Sexual activity: Yes    Partners: Female  Other Topics Concern   Not on file  Social History Narrative   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   Daughter-2013   Son- 2009   Married   Enjoys playing golf   Completed 12th grade   No DWI.    Social Drivers of Manufacturing Engineer Strain: Not on file  Food Insecurity: Not on file  Transportation Needs: Not on file  Physical Activity: Not on file  Stress: Not on file  Social Connections: Unknown (12/26/2021)   Received [facility]   Social Network    Social Network: Not on file  Intimate Partner Violence: Unknown (11/17/2021)   Received from [facility]   HITS    Physically Hurt: Not on file    Insult or Talk Down To: Not on file    Threaten Physical Harm: Not on file    Scream or Curse: Not on file    Outpatient Medications Prior to Visit  Medication Sig Dispense Refill   amLODipine  (NORVASC ) 2.5 MG tablet Take 1 tablet (2.5 mg total) by mouth daily. 90 tablet 0   Coenzyme Q10 (CO Q10) 200 MG CAPS Take 200 mg by mouth daily.     finasteride (PROPECIA) 1 MG tablet 1 mg.     milk thistle 175 MG tablet Take 175 mg by mouth daily.     multivitamin (ONE-A-DAY MEN'S) TABS tablet Take 1 tablet by mouth daily with breakfast.     Omega-3 Fatty Acids (FISH OIL PO) Take 1 capsule by mouth daily.     pantoprazole  (PROTONIX ) 40 MG tablet Take 1 tablet (40 mg total) by mouth daily. 90 tablet 0   Probiotic Product (PROBIOTIC DAILY) CAPS Take 1 capsule by mouth daily.     rosuvastatin  (CRESTOR ) 10 MG tablet TAKE 1 TABLET BY MOUTH DAILY 90 tablet 0   sulfamethoxazole-trimethoprim (BACTRIM DS) 800-160 MG tablet Take 1 tablet by mouth once.     testosterone cypionate (DEPOTESTOSTERONE CYPIONATE) 200 MG/ML injection Inject 240 mg into the muscle every Thursday.     anastrozole (ARIMIDEX) 1 MG tablet Take 0.5 mg by mouth See admin instructions. Take 0.5 mg by mouth every other Thursday     fluticasone  (FLONASE ) 50 MCG/ACT nasal spray Place 1-2 sprays into both nostrils daily.     No facility-administered medications prior to visit.    No Known Allergies  Review of Systems  Constitutional:  Positive for weight loss.  HENT:  Negative for congestion and hearing loss.   Eyes:  Negative for blurred  vision.  Respiratory:  Negative for cough.   Cardiovascular:  Negative for leg swelling.  Gastrointestinal:  Negative for constipation and diarrhea.  Genitourinary:  Negative for dysuria and frequency.  Musculoskeletal:  Negative for joint pain and myalgias.  Skin:  Negative for rash.  Neurological:  Positive for headaches (intermittent headaches).  Psychiatric/Behavioral:  Negative for depression. The patient is not nervous/anxious.        Objective:    Physical Exam   BP 114/74 (BP Location: Right Arm, Patient Position: Sitting, Cuff Size: Large)   Pulse 85   Temp 98 F (36.7 C) (Oral)   Resp 16   Ht 6' 2 (1.88 m)   Wt 236 lb (107 kg)   SpO2 97%   BMI 30.30 kg/m  Wt Readings from Last 3 Encounters:  07/01/24 236 lb (107 kg)  06/26/23 241 lb (109.3 kg)  06/19/22 217 lb (98.4 kg)   Physical Exam  Constitutional: He is oriented to person, place, and time. He appears well-developed and well-nourished. No distress.  HENT:  Head: Normocephalic and atraumatic.  Right Ear: Tympanic membrane and ear canal normal.  Left Ear: Tympanic membrane and ear canal normal.  Mouth/Throat: Oropharynx is clear and moist.  Eyes: Pupils are equal, round, and reactive to light. No scleral icterus.  Neck: Normal range of motion. No thyromegaly present.  Cardiovascular: Normal rate and regular rhythm.   No murmur heard. Pulmonary/Chest: Effort normal and breath sounds normal. No respiratory distress. He has no wheezes. He has no rales. He exhibits no tenderness.  Abdominal: Soft. Bowel sounds are normal. He exhibits no distension and no mass. There is no tenderness. There is no rebound and no guarding.  Musculoskeletal: He exhibits no edema.  Lymphadenopathy:    He has no cervical adenopathy.  Neurological: He is alert and oriented to person, place, and time. He has normal patellar reflexes. He exhibits normal muscle tone. Coordination normal.  Skin: Skin is warm and dry. Several annual  hyperpigmented lesions noted in bilateral upper/inner thigh area Psychiatric: He has a normal mood and affect. His behavior is normal. Judgment and thought content normal.           Assessment & Plan:       Assessment & Plan:   Problem List Items Addressed This Visit       Unprioritized   Snoring   Snoring, large neck circumference, headaches- strong suspicion for OSA.  Refer to pulmonology      Relevant Orders   Ambulatory referral to Pulmonology   Ringworm   New- trial of ketoconazole cream.      Relevant Medications   sulfamethoxazole-trimethoprim (BACTRIM DS) 800-160 MG tablet   ketoconazole (NIZORAL) 2 % cream   Preventative health care - Primary   Continue healthy diet, exercise, weight loss efforts. Encouraged smoking cessation. States he is not ready.  Flu, Heplisav B and Td today.       Mixed hyperlipidemia   Continues rosuvastatin , update lipid panel.       Low testosterone   Gets weekly [facility].       Hyperlipidemia   Relevant Orders   Comp Met (CMET)   Lipid panel   HTN (hypertension)   BP stable on amlodipine . Continue same.       GERD (gastroesophageal reflux disease)   Stable on pantoprazole  once daily.       Other Visit Diagnoses  Needs flu shot       Relevant Orders   Flu vaccine trivalent PF, 6mos and older(Flulaval,Afluria,Fluarix,Fluzone) (Completed)     Need for Td vaccine       Relevant Orders   Td : Tetanus/diphtheria >7yo Preservative  free (Completed)     Need for hepatitis B booster vaccination       Relevant Orders   Heplisav-B (HepB-CPG) Vaccine (Completed)       I have discontinued Rafferty G. Lardizabal Greg's anastrozole and fluticasone . I am also having him start on ketoconazole. Additionally, I am having him maintain his Probiotic Daily, Omega-3 Fatty Acids (FISH OIL PO), milk thistle, testosterone cypionate, multivitamin, Co Q10, rosuvastatin , pantoprazole , amLODipine , finasteride,  and sulfamethoxazole-trimethoprim.  Meds ordered this encounter  Medications   ketoconazole (NIZORAL) 2 % cream    Sig: Apply 1 Application topically 2 (two) times daily. Until resolved    Dispense:  30 g    Refill:  0    Supervising Provider:   DOMENICA BLACKBIRD A [[id number]]

## 2024-07-01 NOTE — Assessment & Plan Note (Signed)
 Continue healthy diet, exercise, weight loss efforts. Encouraged smoking cessation. States he is not ready.  Flu, Heplisav B and Td today.

## 2024-07-01 NOTE — Assessment & Plan Note (Signed)
 Continues rosuvastatin , update lipid panel.

## 2024-07-01 NOTE — Assessment & Plan Note (Signed)
 Snoring, large neck circumference, headaches- strong suspicion for OSA.  Refer to pulmonology

## 2024-07-01 NOTE — Assessment & Plan Note (Signed)
 New- trial of ketoconazole cream.

## 2024-07-01 NOTE — Assessment & Plan Note (Signed)
 Gets weekly testosterone injections at [facility].

## 2024-07-01 NOTE — Assessment & Plan Note (Signed)
 Stable on pantoprazole  once daily.

## 2024-07-01 NOTE — Patient Instructions (Signed)
  VISIT SUMMARY: Today, you had your annual physical exam. We updated your immunizations, discussed your lifestyle habits, and reviewed your current medications and health concerns. You received a tetanus booster and scheduled your hepatitis B vaccine series. We also talked about your smoking habits and potential health risks.  YOUR PLAN: -ADULT WELLNESS VISIT: This was a routine check-up to review your overall health and update your immunizations. You received a tetanus booster, and we scheduled your hepatitis B vaccine series. We postponed the pneumonia vaccine until you turn 50. I encouraged you to maintain regular exercise and a healthy diet, and we discussed smoking cessation options.  -FUNGAL RASH (TINEA CORPORIS): Tinea corporis is a fungal infection that appears as a ringworm-like rash in warm, moist areas. I prescribed an antifungal cream for you to apply twice daily for two weeks. If the rash does not improve, please let me know so we can consider oral antifungal treatment.  -SUSPECTED OBSTRUCTIVE SLEEP APNEA: Obstructive sleep apnea is a condition where your breathing stops and starts during sleep, which can increase the risk of heart attack, stroke, and headaches. I referred you to a sleep specialist for a sleep study to confirm the diagnosis and discuss potential CPAP therapy if needed.  -MIXED HYPERLIPIDEMIA: Mixed hyperlipidemia means you have high levels of different types of fats in your blood. You are currently managing this with rosuvastatin . I ordered liver function tests and a lipid panel to monitor your condition. Please continue taking your medication as prescribed.  -HYPERTENSION: Hypertension is high blood pressure. Continue taking your current antihypertensive medication as prescribed.  -GASTROESOPHAGEAL REFLUX DISEASE (GERD): GERD is a condition where stomach acid frequently flows back into the tube connecting your mouth and stomach. Your symptoms are well-controlled with  pantoprazole , so please continue taking it as prescribed.  -MIGRAINE: Migraines are severe headaches that can last for a long time. You have a history of chronic migraines, but you are not experiencing any headaches currently.  -TOBACCO USE: You continue to smoke about three-quarters of a pack of cigarettes per day. We discussed smoking cessation options, and I encouraged you to consider quitting in the future.  INSTRUCTIONS: Please follow up with the sleep specialist for your sleep study. Continue taking your current medications as prescribed. If your fungal rash does not improve after two weeks of using the antifungal cream, please contact me for further evaluation.

## 2024-07-02 ENCOUNTER — Ambulatory Visit: Payer: Self-pay | Admitting: Family

## 2024-07-02 LAB — COMPREHENSIVE METABOLIC PANEL WITH GFR
ALT: 35 U/L (ref 0–53)
AST: 26 U/L (ref 0–37)
Albumin: 4.7 g/dL (ref 3.5–5.2)
Alkaline Phosphatase: 60 U/L (ref 39–117)
BUN: 13 mg/dL (ref 6–23)
CO2: 28 meq/L (ref 19–32)
Calcium: 9.5 mg/dL (ref 8.4–10.5)
Chloride: 100 meq/L (ref 96–112)
Creatinine, Ser: 1.21 mg/dL (ref 0.40–1.50)
GFR: 70.84 mL/min (ref >60.00)
Glucose, Bld: 77 mg/dL (ref 70–99)
Potassium: 3.9 meq/L (ref 3.5–5.1)
Sodium: 137 meq/L (ref 135–145)
Total Bilirubin: 0.5 mg/dL (ref 0.2–1.2)
Total Protein: 7.1 g/dL (ref 6.0–8.3)

## 2024-07-02 LAB — LIPID PANEL
Cholesterol: 165 mg/dL (ref 0–200)
HDL: 33.9 mg/dL — ABNORMAL LOW (ref >39.00)
LDL Cholesterol: 93 mg/dL (ref 0–99)
NonHDL: 131.36
Total CHOL/HDL Ratio: 5
Triglycerides: 191 mg/dL — ABNORMAL HIGH (ref 0.0–149.0)
VLDL: 38.2 mg/dL (ref 0.0–40.0)

## 2024-07-08 DIAGNOSIS — E291 Testicular hypofunction: Secondary | ICD-10-CM | POA: Diagnosis not present

## 2024-07-15 DIAGNOSIS — E291 Testicular hypofunction: Secondary | ICD-10-CM | POA: Diagnosis not present

## 2024-07-21 ENCOUNTER — Ambulatory Visit

## 2024-07-21 VITALS — BP 125/83 | HR 84 | Ht 74.0 in | Wt 240.0 lb

## 2024-07-21 DIAGNOSIS — E291 Testicular hypofunction: Secondary | ICD-10-CM | POA: Diagnosis not present

## 2024-07-21 DIAGNOSIS — F17210 Nicotine dependence, cigarettes, uncomplicated: Secondary | ICD-10-CM | POA: Diagnosis not present

## 2024-07-21 DIAGNOSIS — Z716 Tobacco abuse counseling: Secondary | ICD-10-CM

## 2024-07-21 DIAGNOSIS — J301 Allergic rhinitis due to pollen: Secondary | ICD-10-CM

## 2024-07-21 DIAGNOSIS — G4726 Circadian rhythm sleep disorder, shift work type: Secondary | ICD-10-CM | POA: Diagnosis not present

## 2024-07-21 DIAGNOSIS — R0683 Snoring: Secondary | ICD-10-CM | POA: Diagnosis not present

## 2024-07-21 MED ORDER — FLUTICASONE PROPIONATE 50 MCG/ACT NA SUSP: 2.0000 | Freq: Every day | NASAL | 2 refills | Status: AC

## 2024-07-21 MED ORDER — SALINE SPRAY 0.65 % NA SOLN: 1.0000 | NASAL | 5 refills | Status: AC | PRN

## 2024-07-21 NOTE — Assessment & Plan Note (Addendum)
  Orders:   Home sleep test; Future

## 2024-07-21 NOTE — Patient Instructions (Signed)
  VISIT SUMMARY: Today, you were seen for an evaluation of sleep apnea. 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 We also reviewed your history of smoking and seasonal allergies.  YOUR PLAN: -SUSPECTED OBSTRUCTIVE SLEEP APNEA: Obstructive sleep apnea is a condition where the airway becomes blocked during sleep, causing breathing to stop and start repeatedly. We have ordered a home sleep study to confirm the diagnosis. If the study is positive, we will discuss CPAP therapy, which involves using a mask to keep your airway open during sleep and can help improve blood pressure and cholesterol levels.  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 Your current sleep schedule has been adjusted to accommodate your night shifts, with daytime sleeping on workdays and nighttime sleeping on days off.  -ALLERGIC RHINITIS: Allergic rhinitis is inflammation of the nasal passages due to allergens, causing symptoms like nasal congestion. We recommend using saline spray as needed and Flonase , one to two sprays once daily, until your symptoms resolve.  -NICOTINE  DEPENDENCE, CIGARETTES: Nicotine  dependence is an addiction to tobacco products. You currently smoke 15 cigarettes a day and have been smoking for about 25 years. We discussed the health risks associated with smoking and encouraged you to consider quitting. We also discussed various smoking cessation options.  INSTRUCTIONS: Please complete the home sleep study as ordered. If the results are positive for sleep apnea, we will discuss CPAP therapy options. Continue using saline spray and Flonase  for nasal congestion as needed. Consider the smoking cessation options we discussed and think about quitting smoking for your overall health.                      Contains text generated by  Abridge.                                 Contains text generated by Abridge.

## 2024-07-21 NOTE — Progress Notes (Signed)
 Pulmonology Office Visit   Subjective:  Patient ID: Gregory Fowler, male    DOB: [birth year]  MRN: [id number]  Referred by: Gregory Setter, NP  CC:  Chief Complaint  Patient presents with   Obstructive Sleep Apnea    Sleep- referred by Dr. Setter Gregory- Daytime somnolence, snoring, headaches, rule out OSA.Gregory Fowler Has not had sleep study. Epworth score=8.     HPI Gregory Fowler is a 48 y.o. male with hypertension hyperlipidemia and GERD presents for evaluation of OSA.  Respective notes from provider reviewed as appropriate to gather relevant information for patient care.   Discussed the use of AI scribe software for clinical note transcription with the patient, who gave verbal consent to proceed.  History of Present Illness   Gregory Fowler is a 48 year old male who presents for evaluation of sleep apnea. He was referred by his primary care physician for evaluation of sleep apnea.  He snores significantly, as noted by his wife, but does not believe he stops breathing during sleep. No episodes of waking up gasping or choking, and he does not usually experience dry mouth or morning headaches. He has been working a third shift for the past three months, which has altered his sleep schedule. He works from 11 PM to 7:30 AM, sleeps during the day, and wakes up around 1:30 PM for appointments or 3 PM if undisturbed. On his days off, he sleeps from midnight to 9 AM and feels refreshed.  He has a history of working long hours, including a period from 2019 to 2024 where he worked multiple jobs and had minimal sleep, but currently feels he is getting more rest. He does not experience significant daytime sleepiness, but does report sleepwalking, sleep talking, nightmares, or night terrors.  He smokes about 15 cigarettes a day and has been smoking since he was 48 years old, with intermittent periods of cessation totaling about 25 years of smoking. He does not currently  wish to quit smoking.  He experiences seasonal allergies and sometimes uses saline spray or Flonase  for nasal congestion, though he is unsure which season exacerbates his symptoms.      PRIOR TESTS and IMAGING: PSG/HSAT: none on file.      07/21/2024    3:00 PM  Results of the Epworth flowsheet  Sitting and reading 0  Watching TV 1  Sitting, inactive in a public place (e.g. a theatre or a meeting) 1  As a passenger in a car for an hour without a break 2  Lying down to rest in the afternoon when circumstances permit 2  Sitting and talking to someone 0  Sitting quietly after a lunch without alcohol 1  In a car, while stopped for a few minutes in traffic 1  Total score 8    Allergies: [name].  Current Outpatient Medications:    amLODipine  (NORVASC ) 2.5 MG tablet, Take 1 tablet (2.5 mg total) by mouth daily., Disp: 90 tablet, Rfl: 0   Coenzyme Q10 (CO Q10) 200 MG CAPS, Take 200 mg by mouth daily., Disp: , Rfl:    finasteride (PROPECIA) 1 MG tablet, 1 mg., Disp: , Rfl:    fluticasone  (FLONASE ) 50 MCG/ACT nasal spray, Place 2 sprays into both nostrils daily., Disp: 16 g, Rfl: 2   ketoconazole  (NIZORAL ) 2 % cream, Apply 1 Application topically 2 (two) times daily. Until resolved, Disp: 30 g, Rfl: 0   milk thistle 175 MG tablet, Take 175 mg by  mouth daily., Disp: , Rfl:    multivitamin (ONE-A-DAY MEN'S) TABS tablet, Take 1 tablet by mouth daily with breakfast., Disp: , Rfl:    Omega-3 Fatty Acids (FISH OIL PO), Take 1 capsule by mouth daily., Disp: , Rfl:    pantoprazole  (PROTONIX ) 40 MG tablet, Take 1 tablet (40 mg total) by mouth daily., Disp: 90 tablet, Rfl: 0   Probiotic Product (PROBIOTIC DAILY) CAPS, Take 1 capsule by mouth daily., Disp: , Rfl:    rosuvastatin  (CRESTOR ) 10 MG tablet, TAKE 1 TABLET BY MOUTH DAILY, Disp: 90 tablet, Rfl: 0   sodium chloride  (OCEAN) 0.65 % SOLN nasal spray, Place 1 spray into both nostrils as needed for congestion., Disp: 60 mL,  Rfl: 5   testosterone cypionate (DEPOTESTOSTERONE CYPIONATE) 200 MG/ML injection, Inject 240 mg into the muscle every Thursday., Disp: , Rfl:  Past Medical History:  Diagnosis Date   Abnormal LFTs 03/03/2011   Body mass index (BMI) 29.0-29.9, adult 03/21/2022   Chronic nonintractable headache 02/23/2022   Family history of early CAD 01/26/2022   Fatigue 03/21/2022   Fatty liver    Gastroparesis    GERD (gastroesophageal reflux disease)    Hiatal hernia    HTN (hypertension) 01/23/2022   Hyperlipidemia 02/23/2022   Hypogonadism in male 02/23/2022   Insulin resistance 03/21/2022   Iron deficiency 03/21/2022   Joint pain 03/21/2022   Kidney stone    Low libido 03/21/2022   Low testosterone 03/21/2022   Migraine without status migrainosus, not intractable 04/27/2022   Mixed hyperlipidemia 03/21/2022   NAFLD (nonalcoholic fatty liver disease) 90/81/7987   Nonspecific elevation of levels of transaminase or lactic acid dehydrogenase (LDH)    Overweight with body mass index (BMI) of 29 to 29.9 in adult 02/23/2022   Perirectal abscess 02/24/2022   Preventative health care 09/08/2014   Secondary polycythemia 03/21/2022   Testicular hypofunction 03/21/2022   Tobacco abuse 07/20/2010   Qualifier: Diagnosis of  By: Eyvonne MD, Debby ORN    Past Surgical History:  Procedure Laterality Date   INCISION AND DRAINAGE ABSCESS N/A 02/25/2022   Procedure: INCISION AND DRAINAGE PERI-RECTAL ABSCESS;  Surgeon: Vernetta Berg, MD;  Location: [facility];  Service: General;  Laterality: N/A;   INCISION AND DRAINAGE PERIRECTAL ABSCESS N/A 04/18/2019   Procedure: IRRIGATION AND DEBRIDEMENT PERIRECTAL ABSCESS EXAM UNDER ANESTHESIA;  Surgeon: Eletha Boas, MD;  Location: [facility];  Service: General;  Laterality: N/A;   INCISION AND DRAINAGE PERIRECTAL ABSCESS N/A 07/29/2019   Procedure: IRRIGATION AND DEBRIDEMENT PERIRECTAL ABSCESS;  Surgeon: Tanda Locus, MD;  Location: [facility];  Service: General;  Laterality:  N/A;   lasix eye surgery Bilateral    TONSILLECTOMY     UPPER GASTROINTESTINAL ENDOSCOPY  2012   Dr.Patterson   Family History  Problem Relation Age of Onset   Liver disease Mother        fatty liver- she is overweight   CAD Father    CAD Paternal Grandfather        bypass   CAD Paternal Uncle        died from MI   CAD Paternal Uncle        died from MI   CAD Paternal Great-grandfather    Colon cancer Neg Hx    Colon polyps Neg Hx    Diabetes Neg Hx    Kidney disease Neg Hx    Gallbladder disease Neg Hx    Esophageal cancer Neg Hx    Social History   Socioeconomic History  Marital status: Married    Spouse name: Not on file   Number of children: 2   Years of education: Not on file   Highest education level: Not on file  Occupational History   Occupation: Film/video Editor: [employer]  Tobacco Use   Smoking status: Every Day    Current packs/day: 1.00    Average packs/day: 1 pack/day for 31.1 years (31.1 ttl pk-yrs)    Types: Cigarettes    Start date: 04/13/2017   Smokeless tobacco: Former    Types: Engineer, Drilling   Vaping status: Never Used  Substance and Sexual Activity   Alcohol use: Yes    Comment: occasional   Drug use: No   Sexual activity: Yes    Partners: Female  Other Topics Concern   Not on file  Social History Narrative   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   Daughter-2013   Son- 2009   Married   Enjoys playing golf   Completed 12th grade   No DWI.    Social Drivers of Corporate Investment Banker Strain: Not on file  Food Insecurity: Not on file  Transportation Needs: Not on file  Physical Activity: Not on file  Stress: Not on file  Social Connections: Unknown (12/26/2021)   Received from [facility]   Social Network    Social Network: Not on file  Intimate Partner Violence: Unknown (11/17/2021)   Received from [facility]   HITS    Physically Hurt: Not on file    Insult or Talk  Down To: Not on file    Threaten Physical Harm: Not on file    Scream or Curse: Not on file       Objective:  BP 125/83   Pulse 84   Ht 6' 2 (1.88 m)   Wt 240 lb (108.9 kg)   SpO2 94% Comment: on RA  BMI 30.81 kg/m  BMI Readings from Last 3 Encounters:  07/21/24 30.81 kg/m  07/01/24 30.30 kg/m  06/26/23 30.94 kg/m    Physical Exam: Physical Exam   ENT: Normal mucosa. No hypertrophy of inferior turbinates. Tonsils are normal sized. Modified Mallampati score of four. PULMONARY: Lungs clear to auscultation bilaterally, no adventitious breath sounds. CARDIOVASCULAR: Regular rate and rhythm, S1 S2 normal, no murmurs. ABDOMEN: Abdomen soft, nontender. Bowel sounds are normal. EXTREMITIES: No peripheral edema noted.       Diagnostic Review:  Last metabolic panel Lab Results  Component Value Date   GLUCOSE 77 07/01/2024   NA 137 07/01/2024   K 3.9 07/01/2024   CL 100 07/01/2024   CO2 28 07/01/2024   BUN 13 07/01/2024   CREATININE 1.21 07/01/2024   GFR 70.84 07/01/2024   CALCIUM  9.5 07/01/2024   PROT 7.1 07/01/2024   ALBUMIN 4.7 07/01/2024   BILITOT 0.5 07/01/2024   ALKPHOS 60 07/01/2024   AST 26 07/01/2024   ALT 35 07/01/2024   ANIONGAP 7 02/25/2022         Assessment & Plan:   Assessment & Plan Snoring  Orders:   Home sleep test; Future  Shift work sleep disorder     Encounter for smoking cessation counseling Smoking/Tobacco Cessation Counseling Gregory Fowler is a current user of tobacco or nicotine  products. He is not ready to quit at this time. Counseling provided today addressed the risks of continued use and the benefits of cessation. Discussed tobacco/nicotine  use history, readiness to quit,  and evidence-based treatment options including behavioral strategies, support resources, and pharmacologic therapies. Provided encouragement and educational materials on steps and resources to quit smoking. Patient questions were addressed, and  follow-up recommended for continued support. Total time spent on counseling: 4 minutes.      Allergic rhinitis due to pollen, unspecified seasonality        Assessment and Plan    Suspected obstructive sleep apnea Sleep study recommended to confirm diagnosis. Discussed CPAP therapy benefits if confirmed, including improved blood pressure and cholesterol control. Explained CPAP therapy involves using a mask to maintain airway patency during sleep. - Ordered home sleep study. - If sleep study is positive, will discuss CPAP therapy options.  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 Sleep schedule adjusted to daytime sleeping during workdays and nighttime sleeping on days off.  Allergic rhinitis Intermittent nasal congestion, possibly related to seasonal allergies. Discussed saline spray and Flonase  for symptom management. - Recommended saline spray for nasal congestion as needed. - Recommended Flonase , one to two sprays once daily, until symptoms resolve.  Nicotine  dependence, cigarettes Long-standing nicotine  dependence with smoking 15 cigarettes per day for approximately 25 years. Discussed health risks and encouraged smoking cessation. - Discussed smoking cessation options and encouraged consideration of quitting.      Notes from 07/01/2024 with NP Eleanor Ponto reviewed as to gather relevant information for patient care and formulating plan.  He was counselled about not driving while drowsy which is common side effect of sleep related disorders.   No follow-ups on file.   I personally spent a total of 30 minutes in the care of the patient today including preparing to see the patient, getting/reviewing separately obtained history, performing a medically appropriate exam/evaluation, counseling and educating, placing orders, documenting clinical information in the EHR, independently interpreting results, and communicating results.   Kaliya Shreiner, MD

## 2024-07-27 ENCOUNTER — Other Ambulatory Visit: Payer: Self-pay

## 2024-07-27 DIAGNOSIS — E782 Mixed hyperlipidemia: Secondary | ICD-10-CM

## 2024-07-27 MED ORDER — ROSUVASTATIN CALCIUM 10 MG PO TABS: 10.0000 mg | ORAL_TABLET | Freq: Every day | ORAL | 1 refills | Status: AC

## 2024-07-29 ENCOUNTER — Ambulatory Visit

## 2024-07-29 DIAGNOSIS — E291 Testicular hypofunction: Secondary | ICD-10-CM | POA: Diagnosis not present

## 2024-07-31 ENCOUNTER — Ambulatory Visit

## 2024-07-31 DIAGNOSIS — Z23 Encounter for immunization: Secondary | ICD-10-CM | POA: Diagnosis not present

## 2024-07-31 NOTE — Progress Notes (Signed)
 Pt here today for 2nd Hep B vaccines per [name],FNP:  Return in about 4 weeks (around 07/29/2024) for nurse visit for heplisav B #2  Pt received vaccine in right deltoid and tolerated vaccine well.

## 2024-08-04 DIAGNOSIS — E291 Testicular hypofunction: Secondary | ICD-10-CM | POA: Diagnosis not present

## 2024-08-10 ENCOUNTER — Encounter

## 2024-08-10 DIAGNOSIS — R0683 Snoring: Secondary | ICD-10-CM

## 2024-08-24 ENCOUNTER — Telehealth: Payer: Self-pay | Admitting: Pulmonary Disease

## 2024-08-24 DIAGNOSIS — G4733 Obstructive sleep apnea (adult) (pediatric): Secondary | ICD-10-CM

## 2024-08-24 DIAGNOSIS — R069 Unspecified abnormalities of breathing: Secondary | ICD-10-CM | POA: Diagnosis not present

## 2024-08-24 NOTE — Telephone Encounter (Signed)
 Left pt message to return call

## 2024-08-24 NOTE — Telephone Encounter (Signed)
 HST showed mod OSA with AHI 16/ hr & low sat 85% -worse when he was on his back   Suggest autoCPAP  5-15 cm, mask of choice OV with dr pawar  in 6 wks after starting

## 2024-08-27 NOTE — Telephone Encounter (Signed)
 Pt notified and order placed

## 2024-08-27 NOTE — Telephone Encounter (Signed)
 Copied from CRM #[id number]. Topic: Clinical - Lab/Test Results >> Aug 24, 2024  3:06 PM Gregory Fowler wrote: Reason for CRM: Patient is returning a missed call from [facility] regarding his HST. Attempted CAL but unavailable at the time. Advised patient to expect a call back tomorrow.

## 2024-09-04 ENCOUNTER — Other Ambulatory Visit: Payer: Self-pay | Admitting: Family

## 2024-09-10 ENCOUNTER — Telehealth (HOSPITAL_BASED_OUTPATIENT_CLINIC_OR_DEPARTMENT_OTHER): Payer: Self-pay

## 2024-09-10 ENCOUNTER — Other Ambulatory Visit: Payer: Self-pay | Admitting: Family

## 2024-09-10 DIAGNOSIS — I10 Essential (primary) hypertension: Secondary | ICD-10-CM

## 2024-09-10 NOTE — Telephone Encounter (Signed)
 CMN received for CPAP supplies sent to [facility] signed by provider and faxed confirmation received
